# Patient Record
Sex: Female | Born: 1985 | Race: White | Hispanic: No | Smoking: Never smoker
Health system: Southern US, Community
[De-identification: ages and names within clinical notes are randomized; demographics above are authoritative.]

## PROBLEM LIST (undated history)

## (undated) DIAGNOSIS — G2581 Restless legs syndrome: Secondary | ICD-10-CM

## (undated) DIAGNOSIS — M069 Rheumatoid arthritis, unspecified: Secondary | ICD-10-CM

## (undated) DIAGNOSIS — K219 Gastro-esophageal reflux disease without esophagitis: Secondary | ICD-10-CM

## (undated) DIAGNOSIS — Q2112 Patent foramen ovale: Secondary | ICD-10-CM

## (undated) DIAGNOSIS — M5430 Sciatica, unspecified side: Secondary | ICD-10-CM

## (undated) DIAGNOSIS — Q211 Atrial septal defect: Secondary | ICD-10-CM

## (undated) DIAGNOSIS — G959 Disease of spinal cord, unspecified: Secondary | ICD-10-CM

## (undated) DIAGNOSIS — G43909 Migraine, unspecified, not intractable, without status migrainosus: Secondary | ICD-10-CM

## (undated) DIAGNOSIS — F419 Anxiety disorder, unspecified: Secondary | ICD-10-CM

## (undated) DIAGNOSIS — M549 Dorsalgia, unspecified: Secondary | ICD-10-CM

## (undated) DIAGNOSIS — M419 Scoliosis, unspecified: Secondary | ICD-10-CM

## (undated) DIAGNOSIS — T7840XA Allergy, unspecified, initial encounter: Secondary | ICD-10-CM

## (undated) DIAGNOSIS — I341 Nonrheumatic mitral (valve) prolapse: Secondary | ICD-10-CM

## (undated) HISTORY — PX: CARDIAC CATHETERIZATION: SHX172

## (undated) HISTORY — DX: Allergy, unspecified, initial encounter: T78.40XA

## (undated) HISTORY — DX: Gastro-esophageal reflux disease without esophagitis: K21.9

## (undated) HISTORY — DX: Anxiety disorder, unspecified: F41.9

## (undated) HISTORY — DX: Migraine, unspecified, not intractable, without status migrainosus: G43.909

## (undated) HISTORY — PX: BACK SURGERY: SHX140

---

## 2003-12-02 ENCOUNTER — Emergency Department (HOSPITAL_COMMUNITY): Admission: EM | Admit: 2003-12-02 | Discharge: 2003-12-02 | Payer: Self-pay | Admitting: Emergency Medicine

## 2004-01-11 ENCOUNTER — Emergency Department (HOSPITAL_COMMUNITY): Admission: EM | Admit: 2004-01-11 | Discharge: 2004-01-12 | Payer: Self-pay | Admitting: Emergency Medicine

## 2004-11-12 ENCOUNTER — Ambulatory Visit: Payer: Self-pay | Admitting: Pulmonary Disease

## 2004-11-20 ENCOUNTER — Ambulatory Visit: Payer: Self-pay | Admitting: Pulmonary Disease

## 2004-11-20 ENCOUNTER — Ambulatory Visit: Admission: RE | Admit: 2004-11-20 | Discharge: 2004-11-20 | Payer: Self-pay | Admitting: Pulmonary Disease

## 2004-12-01 ENCOUNTER — Ambulatory Visit: Payer: Self-pay | Admitting: Pulmonary Disease

## 2004-12-25 ENCOUNTER — Ambulatory Visit: Payer: Self-pay | Admitting: Pulmonary Disease

## 2005-01-26 ENCOUNTER — Ambulatory Visit: Payer: Self-pay | Admitting: Pulmonary Disease

## 2005-03-13 ENCOUNTER — Emergency Department (HOSPITAL_COMMUNITY): Admission: EM | Admit: 2005-03-13 | Discharge: 2005-03-13 | Payer: Self-pay | Admitting: Emergency Medicine

## 2005-04-16 ENCOUNTER — Ambulatory Visit: Payer: Self-pay | Admitting: Pulmonary Disease

## 2005-05-05 ENCOUNTER — Emergency Department (HOSPITAL_COMMUNITY): Admission: EM | Admit: 2005-05-05 | Discharge: 2005-05-05 | Payer: Self-pay | Admitting: Emergency Medicine

## 2005-06-01 ENCOUNTER — Ambulatory Visit: Payer: Self-pay | Admitting: Pulmonary Disease

## 2006-02-24 ENCOUNTER — Emergency Department (HOSPITAL_COMMUNITY): Admission: EM | Admit: 2006-02-24 | Discharge: 2006-02-24 | Payer: Self-pay | Admitting: Emergency Medicine

## 2010-04-18 ENCOUNTER — Ambulatory Visit (HOSPITAL_COMMUNITY)
Admission: RE | Admit: 2010-04-18 | Discharge: 2010-04-18 | Payer: Self-pay | Admitting: Physical Medicine & Rehabilitation

## 2010-05-28 ENCOUNTER — Emergency Department (HOSPITAL_COMMUNITY): Admission: EM | Admit: 2010-05-28 | Discharge: 2010-05-28 | Payer: Self-pay | Admitting: Emergency Medicine

## 2011-03-20 NOTE — Op Note (Signed)
[name], [name]                ACCOUNT NO.:  [id number]   MEDICAL RECORD NO.:  [id number]          PATIENT TYPE:  OUT   LOCATION:  CARD                         FACILITY:  [facility]   PHYSICIAN:  Oley Balm. Sung Amabile, M.D. [facility] OF BIRTH:  [birth year]   DATE OF PROCEDURE:  11/20/2004  DATE OF DISCHARGE:  11/20/2004                                 OPERATIVE REPORT   PROCEDURE:  Cardiopulmonary stress test.   INDICATIONS FOR PROCEDURE:  Unexplained dyspnea.   DESCRIPTION OF PROCEDURE:  Cardiopulmonary stress testing was performed on a  graded treadmill. Testing was stopped due to dyspnea and unsteadiness.  Effort was maximal. At peak exercise, oxygen uptake was 1.35 liters per  minute or 38% of predicted maximum indicating severe exercise impairment.   At peak exercise, heart rate was 146 or 73% of predicted maximum indicating  that cardiovascular reserve remained. Oxygen pulse was normal suggesting  normal left ventricular function.  Blood pressure response was normal. EKG  tracings revealed no definite ischemia nor arrhythmias. However, there was  abundant artifact on EKG tracings during exercise. Heart rate recovery was  normal.   At peak exercise, minute ventilation was 50.1 liters per minute or 47% of  maximum voluntary ventilation indicating that ventilatory reserve removed.  Gas exchange parameters revealed no abnormalities. Baseline pulmonary  function test revealed mild restriction. There was no obstruction and  __________ capacity was normal. Post exercise spirometry revealed no  exercise induced bronchospasm.   SUMMARY:  Severe exercise impairment due to musculoskeletal limitation and  unsteadiness.  There was a normal cardiopulmonary response to exercise. Mild  restriction on pulmonary function testing.      DBS/MEDQ  D:  12/21/2004  T:  12/22/2004  Job:  [id number]

## 2011-05-10 ENCOUNTER — Emergency Department (HOSPITAL_COMMUNITY)
Admission: EM | Admit: 2011-05-10 | Discharge: 2011-05-10 | Disposition: A | Discharge status: Home / Self Care | Payer: Medicare Other | Attending: Emergency Medicine | Admitting: Emergency Medicine

## 2011-05-10 DIAGNOSIS — W261XXA Contact with sword or dagger, initial encounter: Secondary | ICD-10-CM | POA: Insufficient documentation

## 2011-05-10 DIAGNOSIS — S61209A Unspecified open wound of unspecified finger without damage to nail, initial encounter: Secondary | ICD-10-CM | POA: Insufficient documentation

## 2011-05-10 DIAGNOSIS — Y998 Other external cause status: Secondary | ICD-10-CM | POA: Insufficient documentation

## 2011-05-10 DIAGNOSIS — Z79899 Other long term (current) drug therapy: Secondary | ICD-10-CM | POA: Insufficient documentation

## 2011-05-10 DIAGNOSIS — M4120 Other idiopathic scoliosis, site unspecified: Secondary | ICD-10-CM | POA: Insufficient documentation

## 2011-05-10 DIAGNOSIS — Y92009 Unspecified place in unspecified non-institutional (private) residence as the place of occurrence of the external cause: Secondary | ICD-10-CM | POA: Insufficient documentation

## 2011-05-10 DIAGNOSIS — W260XXA Contact with knife, initial encounter: Secondary | ICD-10-CM | POA: Insufficient documentation

## 2011-11-20 ENCOUNTER — Emergency Department (HOSPITAL_COMMUNITY)
Admission: EM | Admit: 2011-11-20 | Discharge: 2011-11-20 | Disposition: A | Discharge status: Home / Self Care | Payer: Medicare Other | Attending: Emergency Medicine | Admitting: Emergency Medicine

## 2011-11-20 ENCOUNTER — Encounter (HOSPITAL_COMMUNITY): Payer: Self-pay | Admitting: *Deleted

## 2011-11-20 DIAGNOSIS — K089 Disorder of teeth and supporting structures, unspecified: Secondary | ICD-10-CM | POA: Insufficient documentation

## 2011-11-20 DIAGNOSIS — K0889 Other specified disorders of teeth and supporting structures: Secondary | ICD-10-CM

## 2011-11-20 DIAGNOSIS — R51 Headache: Secondary | ICD-10-CM | POA: Insufficient documentation

## 2011-11-20 HISTORY — DX: Disease of spinal cord, unspecified: G95.9

## 2011-11-20 HISTORY — DX: Scoliosis, unspecified: M41.9

## 2011-11-20 HISTORY — DX: Dorsalgia, unspecified: M54.9

## 2011-11-20 HISTORY — DX: Patent foramen ovale: Q21.12

## 2011-11-20 HISTORY — DX: Sciatica, unspecified side: M54.30

## 2011-11-20 HISTORY — DX: Nonrheumatic mitral (valve) prolapse: I34.1

## 2011-11-20 HISTORY — DX: Restless legs syndrome: G25.81

## 2011-11-20 HISTORY — DX: Atrial septal defect: Q21.1

## 2011-11-20 HISTORY — DX: Rheumatoid arthritis, unspecified: M06.9

## 2011-11-20 MED ORDER — LIDOCAINE-EPINEPHRINE 2 %-1:100000 IJ SOLN
20.0000 mL | INTRAMUSCULAR | Status: AC
  Administered 2011-11-20: 20 mL
  Filled 2011-11-20: qty 20

## 2011-11-20 MED ORDER — PENICILLIN V POTASSIUM 500 MG PO TABS: 500.0000 mg | ORAL_TABLET | Freq: Four times a day (QID) | ORAL | Status: AC

## 2011-11-20 MED ORDER — OXYCODONE-ACETAMINOPHEN 5-325 MG PO TABS: 1.0000 | ORAL_TABLET | ORAL | Status: AC | PRN

## 2011-11-20 NOTE — ED Notes (Signed)
Patient reports she woke with pain in the lower right side of her mouth on Wed.  She states she has knots under her jaw.  Patient states she has an appointment on Monday but she could not wait

## 2011-11-20 NOTE — ED Provider Notes (Signed)
History    26 year old female with facial pain. Gradual onset Wednesday. Progressively worsening since then. No pressure will exacerbate or relieving factors. No difficulty breathing or swallowing. Denies trauma. Fever or chills. Has not been to see dentist yet. Says she has an appointment for next week but because of increasing pain did not feel she could wait until the   CSN: [id number]  Arrival date & time 11/20/11  1228   First MD Initiated Contact with Patient 11/20/11 1236      No chief complaint on file.   (Consider location/radiation/quality/duration/timing/severity/associated sxs/prior treatment) HPI  No past medical history on file.  No past surgical history on file.  No family history on file.  History  Substance Use Topics  . Smoking status: Not on file  . Smokeless tobacco: Not on file  . Alcohol Use: Not on file    OB History    No data available      Review of Systems   Review of symptoms negative unless otherwise noted in HPI.   Allergies  Food  Home Medications  No current outpatient prescriptions on file.  There were no vitals taken for this visit.  Physical Exam  Nursing note and vitals reviewed. Constitutional: She appears well-developed and well-nourished. No distress.  HENT:  Head: Normocephalic and atraumatic.       Patient points to the area of the lower right second molar as area of pain. This area is unremarkable in appearance. There is no significant gingivitis. There is no evidence of abscess. No oral lesions noted. Uvula is midline. Handling secretions. The neck is supple without adenopathy. Submental tissues are soft and there is no tongue elevation.  Eyes: Conjunctivae are normal. Right eye exhibits no discharge. Left eye exhibits no discharge.  Neck: Normal range of motion. Neck supple.  Cardiovascular: Normal rate, regular rhythm and normal heart sounds.  Exam reveals no gallop and no friction rub.   No murmur  heard. Pulmonary/Chest: Effort normal and breath sounds normal. No respiratory distress.  Musculoskeletal: She exhibits no edema and no tenderness.  Lymphadenopathy:    She has no cervical adenopathy.  Neurological: She is alert.  Skin: Skin is warm and dry.  Psychiatric: She has a normal mood and affect. Her behavior is normal. Thought content normal.    ED Course  NERVE BLOCK Date/Time: 11/20/2011 3:21 PM Performed by: Raeford Razor Authorized by: Raeford Razor Consent: Verbal consent obtained. Risks and benefits: risks, benefits and alternatives were discussed Consent given by: patient Patient identity confirmed: verbally with patient and arm band Indications: pain relief Body area: face/mouth Nerve: inferior alveolar Laterality: right Patient sedated: no Patient position: sitting Needle gauge: 27 G Location technique: anatomical landmarks Local anesthetic: bupivacaine 0.5% with epinephrine Anesthetic total: 2 ml Outcome: pain improved Patient tolerance: Patient tolerated the procedure well with no immediate complications.   (including critical care time)  Labs Reviewed - No data to display No results found.   1. Pain, dental       MDM  26 year old female with dental pain. No evidence of abscess. No evidence of deep space infection. Patient agreeable to inferior alveolar block. Pain medication provided on an as needed basis. Resource list provided. Dental referral. Return precautions discussed.       Raeford Razor, MD 11/24/11 1525

## 2011-11-20 NOTE — ED Notes (Signed)
Woke up wed am w/ pain in rt lower jaw tooth pain called to speak to dentist but could not get in . This am has knots under jaw now

## 2011-12-23 ENCOUNTER — Other Ambulatory Visit (HOSPITAL_COMMUNITY): Payer: Self-pay | Admitting: Orthopedic Surgery

## 2011-12-23 DIAGNOSIS — M545 Low back pain, unspecified: Secondary | ICD-10-CM

## 2011-12-25 ENCOUNTER — Ambulatory Visit (HOSPITAL_COMMUNITY)
Admission: RE | Admit: 2011-12-25 | Discharge: 2011-12-25 | Disposition: A | Discharge status: Home / Self Care | Payer: Medicare Other | Source: Ambulatory Visit | Attending: Orthopedic Surgery | Admitting: Orthopedic Surgery

## 2011-12-25 DIAGNOSIS — M79609 Pain in unspecified limb: Secondary | ICD-10-CM | POA: Insufficient documentation

## 2011-12-25 DIAGNOSIS — Z981 Arthrodesis status: Secondary | ICD-10-CM | POA: Insufficient documentation

## 2011-12-25 DIAGNOSIS — M545 Low back pain, unspecified: Secondary | ICD-10-CM | POA: Insufficient documentation

## 2011-12-25 DIAGNOSIS — Q675 Congenital deformity of spine: Secondary | ICD-10-CM | POA: Insufficient documentation

## 2012-04-26 ENCOUNTER — Encounter (HOSPITAL_COMMUNITY): Payer: Self-pay | Admitting: *Deleted

## 2012-04-26 ENCOUNTER — Emergency Department (HOSPITAL_COMMUNITY)
Admission: EM | Admit: 2012-04-26 | Discharge: 2012-04-27 | Disposition: A | Discharge status: Home / Self Care | Payer: Medicare Other | Attending: Emergency Medicine | Admitting: Emergency Medicine

## 2012-04-26 ENCOUNTER — Emergency Department (HOSPITAL_COMMUNITY): Payer: Medicare Other

## 2012-04-26 DIAGNOSIS — M549 Dorsalgia, unspecified: Secondary | ICD-10-CM | POA: Insufficient documentation

## 2012-04-26 DIAGNOSIS — M539 Dorsopathy, unspecified: Secondary | ICD-10-CM | POA: Insufficient documentation

## 2012-04-26 DIAGNOSIS — Z9889 Other specified postprocedural states: Secondary | ICD-10-CM | POA: Insufficient documentation

## 2012-04-26 DIAGNOSIS — Z79899 Other long term (current) drug therapy: Secondary | ICD-10-CM | POA: Insufficient documentation

## 2012-04-26 DIAGNOSIS — M069 Rheumatoid arthritis, unspecified: Secondary | ICD-10-CM | POA: Insufficient documentation

## 2012-04-26 DIAGNOSIS — M4120 Other idiopathic scoliosis, site unspecified: Secondary | ICD-10-CM | POA: Insufficient documentation

## 2012-04-26 NOTE — ED Provider Notes (Signed)
History   This chart was scribed for Ashlee Shi, MD by Ashlee [name]. The patient was seen in room TR10C/TR10C and the patient's care was started at 7:09 PM     CSN: [id number]  Arrival date & time 04/26/12  1647   None     Chief Complaint  Patient presents with  . Back Pain    (Consider location/radiation/quality/duration/timing/severity/associated sxs/prior treatment) Patient is a 26 y.o. female presenting with back pain. The history is provided by the patient. No language interpreter was used.  Back Pain  This is a new problem. The current episode started 3 to 5 hours ago. The problem occurs constantly. The problem has not changed since onset.The pain is associated with no known injury. The pain does not radiate. The pain is moderate. The symptoms are aggravated by certain positions. The pain is the same all the time. She has tried nothing for the symptoms. The treatment provided no relief.    Ashlee [name] is a 26 y.o. female who presents to the Emergency Department complaining of moderate, episodic back pain onset today. The pt sat up this morning from her desk, picked up her right leg and her back popped. The pt was referred to [facility] to have x-rays performed. Modifying factors include 4 mg dilaudid (taken at 3:00PM) which provides moderate relief of the back pain.   Pt has a hx of back surgery (Dr. Cherene [name], 03/16/12), spinal cord disease, back pain, scoliosis.        Past Medical History  Diagnosis Date  . Scoliosis   . Rheumatoid arthritis   . Mitral valve prolapse   . Patent foramen ovale   . Back pain   . Restless leg syndrome   . Spinal cord disease   . Sciatica     Past Surgical History  Procedure Date  . Back surgery   . Cardiac catheterization       History  Substance Use Topics  . Smoking status: Never Smoker   . Smokeless tobacco: Not on file  . Alcohol Use: No    OB History    Grav Para Term Preterm Abortions TAB SAB Ect Mult Living        Review of Systems  Musculoskeletal: Positive for back pain.  All other systems reviewed and are negative.    Allergies  Food and Tape  Home Medications   Current Outpatient Rx  Name Route Sig Dispense Refill  . AMPHETAMINE-DEXTROAMPHET ER 25 MG PO CP24 Oral Take 25 mg by mouth every morning.    Marland Kitchen AMPHETAMINE-DEXTROAMPHETAMINE 10 MG PO TABS Oral Take 10 mg by mouth daily at 2 PM daily at 2 PM.     . BACLOFEN 10 MG PO TABS Oral Take 30-40 mg by mouth 3 (three) times daily. 4 tablet in the morning 3 tablets in the afternoon 4 tablets at bedtime    . GABAPENTIN 300 MG PO CAPS Oral Take 900 mg by mouth at bedtime.    Marland Kitchen HYDROMORPHONE HCL 4 MG PO TABS Oral Take 4 mg by mouth 3 (three) times daily as needed. For pain    . HYDROXYCHLOROQUINE SULFATE 200 MG PO TABS Oral Take 200 mg by mouth 2 (two) times daily.     Marland Kitchen NAPROXEN 500 MG PO TABS Oral Take 500 mg by mouth 2 (two) times daily.     Marland Kitchen PREGABALIN 100 MG PO CAPS Oral Take 100 mg by mouth 3 (three) times daily.    . SUMATRIPTAN SUCCINATE 25 MG  PO TABS Oral Take 25 mg by mouth daily as needed. For migraine    . TIZANIDINE HCL 4 MG PO TABS Oral Take 4 mg by mouth 3 (three) times daily.    . TRAMADOL HCL 50 MG PO TABS Oral Take 100 mg by mouth 3 (three) times daily.       BP 123/66  Temp 99.2 F (37.3 C) (Oral)  Resp 20  SpO2 98%  LMP 04/26/2012  Physical Exam  Nursing note and vitals reviewed. Constitutional: She is oriented to person, place, and time. She appears well-developed and well-nourished. No distress.  HENT:  Head: Normocephalic and atraumatic.  Eyes: Pupils are equal, round, and reactive to light.  Neck: Normal range of motion.  Cardiovascular: Normal rate and intact distal pulses.   Pulmonary/Chest: No respiratory distress.  Abdominal: Normal appearance. She exhibits no distension.  Musculoskeletal:       Thoracic back: She exhibits decreased range of motion and pain.       Lumbar back: She exhibits  decreased range of motion and pain.       Back:  Neurological: She is alert and oriented to person, place, and time. No cranial nerve deficit.  Skin: Skin is warm and dry. No rash noted.  Psychiatric: She has a normal mood and affect. Her behavior is normal.    ED Course  Procedures (including critical care time)  DIAGNOSTIC STUDIES: Oxygen Saturation is 98% on room air, normal by my interpretation.    COORDINATION OF CARE:     Labs Reviewed - No data to display No results found.   1. Back pain       MDM  I personally performed the services described in this documentation, which was scribed in my presence. The recorded information has been reviewed and considered.    Plan: Review films for possible hardware malfunction.       Ashlee Shi, MD 05/05/12 [phone]

## 2012-04-26 NOTE — ED Notes (Signed)
The pt returned from c-t 

## 2012-04-26 NOTE — ED Notes (Signed)
Family at bedside. 

## 2012-04-26 NOTE — ED Notes (Signed)
The pt is c/o pain inher entire back and with the slightest movement

## 2012-04-26 NOTE — ED Notes (Signed)
Patient transported to X-ray 

## 2012-04-26 NOTE — ED Notes (Signed)
Patient with recent back surgery, patient unusual pops in back this am, patient unsure if she may have displace hardware placed in back, patient with similar experience post surgery in 2011

## 2012-04-26 NOTE — ED Notes (Signed)
Patient transported to CT 

## 2012-04-27 NOTE — ED Notes (Signed)
Ashlee [name] S  8:00 PM patient discussed in sign out with Dr. Radford Pax. Patient with long history of scoliosis and prior back surgeries rod placements. Patient has chronic back pains. Patient underwent recent revision of back surgeries ML rod placements. Patient was moving and felt a pop with continued pain in the back area. Patient has history of prior bowel function and breaking of equipment and metal rods and back. Patient is concerned for similar symptoms today. Patient undergoing x-rays for evaluation.  X-rays do not show any signs for broken equipment or acute injuries or fractures. Patient still expressing concerns for significant pains. Patient states that there was issues the last time she had a problem with her metal rods and that several x-rays have been taken without any findings however when a CT scan was performed they showed the rods were broken. Patient is concerned that the normal x-rays may not show a significant change in her equipment. Will obtain CT scan.  CT scan is unremarkable and does not show any function of metal rods or screws. There no acute fractures or other signs of injury. At this time patient may be discharged home on her current home pain medications of Dilaudid. Patient will followup with her specialist in [place] this Friday.    Angus Seller, [place] 04/27/12 [phone]

## 2012-04-27 NOTE — Discharge Instructions (Signed)
You were seen and evaluated for your complaints of back pains. Your x-rays and CAT scan had not shown any complications with your rods or screws in your back. There does not appear to be any other significant injury to cause your increased pains. At this time your providers feel you may return home and followup with your specialists as planned. Please call them tomorrow to schedule a close followup appointment. Please continue your home pain medications as prescribed.    Back Pain, Adult Low back pain is very common. About 1 in 5 people have back pain.The cause of low back pain is rarely dangerous. The pain often gets better over time.About half of people with a sudden onset of back pain feel better in just 2 weeks. About 8 in 10 people feel better by 6 weeks.  CAUSES Some common causes of back pain include:  Strain of the muscles or ligaments supporting the spine.   Wear and tear (degeneration) of the spinal discs.   Arthritis.   Direct injury to the back.  DIAGNOSIS Most of the time, the direct cause of low back pain is not known.However, back pain can be treated effectively even when the exact cause of the pain is unknown.Answering your caregiver's questions about your overall health and symptoms is one of the most accurate ways to make sure the cause of your pain is not dangerous. If your caregiver needs more information, he or she may order lab work or imaging tests (X-rays or MRIs).However, even if imaging tests show changes in your back, this usually does not require surgery. HOME CARE INSTRUCTIONS For many people, back pain returns.Since low back pain is rarely dangerous, it is often a condition that people can learn to [facility] their own.   Remain active. It is stressful on the back to sit or stand in one place. Do not sit, drive, or stand in one place for more than 30 minutes at a time. Take short walks on level surfaces as soon as pain allows.Try to increase the length of time  you walk each day.   Do not stay in bed.Resting more than 1 or 2 days can delay your recovery.   Do not avoid exercise or work.Your body is made to move.It is not dangerous to be active, even though your back may hurt.Your back will likely heal faster if you return to being active before your pain is gone.   Pay attention to your body when you bend and lift. Many people have less discomfortwhen lifting if they bend their knees, keep the load close to their bodies,and avoid twisting. Often, the most comfortable positions are those that put less stress on your recovering back.   Find a comfortable position to sleep. Use a firm mattress and lie on your side with your knees slightly bent. If you lie on your back, put a pillow under your knees.   Only take over-the-counter or prescription medicines as directed by your caregiver. Over-the-counter medicines to reduce pain and inflammation are often the most helpful.Your caregiver may prescribe muscle relaxant drugs.These medicines help dull your pain so you can more quickly return to your normal activities and healthy exercise.   Put ice on the injured area.   Put ice in a plastic bag.   Place a towel between your skin and the bag.   Leave the ice on for 15 to 20 minutes, 3 to 4 times a day for the first 2 to 3 days. After that, ice and heat  may be alternated to reduce pain and spasms.   Ask your caregiver about trying back exercises and gentle massage. This may be of some benefit.   Avoid feeling anxious or stressed.Stress increases muscle tension and can worsen back pain.It is important to recognize when you are anxious or stressed and learn ways to manage it.Exercise is a great option.  SEEK MEDICAL CARE IF:  You have pain that is not relieved with rest or medicine.   You have pain that does not improve in 1 week.   You have new symptoms.   You are generally not feeling well.  SEEK IMMEDIATE MEDICAL CARE IF:   You have pain  that radiates from your back into your legs.   You develop new bowel or bladder control problems.   You have unusual weakness or numbness in your arms or legs.   You develop nausea or vomiting.   You develop abdominal pain.   You feel faint.  Document Released: 10/19/2005 Document Revised: 10/08/2011 Document Reviewed: 03/09/2011 [facility] Patient Information 2012 [place], [place].

## 2012-05-25 ENCOUNTER — Ambulatory Visit: Payer: Medicare Other | Attending: Orthopedic Surgery

## 2012-05-25 DIAGNOSIS — IMO0001 Reserved for inherently not codable concepts without codable children: Secondary | ICD-10-CM | POA: Insufficient documentation

## 2012-05-25 DIAGNOSIS — M2560 Stiffness of unspecified joint, not elsewhere classified: Secondary | ICD-10-CM | POA: Insufficient documentation

## 2012-05-25 DIAGNOSIS — R5381 Other malaise: Secondary | ICD-10-CM | POA: Insufficient documentation

## 2012-05-25 DIAGNOSIS — M6281 Muscle weakness (generalized): Secondary | ICD-10-CM | POA: Insufficient documentation

## 2012-05-25 DIAGNOSIS — M545 Low back pain, unspecified: Secondary | ICD-10-CM | POA: Insufficient documentation

## 2012-05-30 ENCOUNTER — Ambulatory Visit: Payer: Medicare Other

## 2012-06-01 ENCOUNTER — Ambulatory Visit: Payer: Medicare Other | Admitting: Physical Therapy

## 2012-06-06 ENCOUNTER — Ambulatory Visit: Payer: Medicare Other | Attending: Orthopedic Surgery

## 2012-06-06 DIAGNOSIS — M545 Low back pain, unspecified: Secondary | ICD-10-CM | POA: Insufficient documentation

## 2012-06-06 DIAGNOSIS — M2560 Stiffness of unspecified joint, not elsewhere classified: Secondary | ICD-10-CM | POA: Insufficient documentation

## 2012-06-06 DIAGNOSIS — M6281 Muscle weakness (generalized): Secondary | ICD-10-CM | POA: Insufficient documentation

## 2012-06-06 DIAGNOSIS — R5381 Other malaise: Secondary | ICD-10-CM | POA: Insufficient documentation

## 2012-06-06 DIAGNOSIS — IMO0001 Reserved for inherently not codable concepts without codable children: Secondary | ICD-10-CM | POA: Insufficient documentation

## 2012-06-08 ENCOUNTER — Ambulatory Visit: Payer: Medicare Other | Admitting: Physical Therapy

## 2012-06-13 ENCOUNTER — Ambulatory Visit: Payer: Medicare Other | Admitting: Physical Therapy

## 2012-06-15 ENCOUNTER — Ambulatory Visit: Payer: Medicare Other | Admitting: Physical Therapy

## 2012-06-20 ENCOUNTER — Ambulatory Visit: Payer: Medicare Other | Admitting: Physical Therapy

## 2012-06-22 ENCOUNTER — Ambulatory Visit: Payer: Medicare Other

## 2012-06-27 ENCOUNTER — Encounter: Payer: Medicare Other | Admitting: Physical Therapy

## 2012-06-28 ENCOUNTER — Ambulatory Visit: Payer: Medicare Other

## 2012-06-29 ENCOUNTER — Encounter: Payer: Medicare Other | Admitting: Physical Therapy

## 2012-06-30 ENCOUNTER — Ambulatory Visit: Payer: Medicare Other

## 2012-07-13 ENCOUNTER — Ambulatory Visit: Payer: Medicare Other | Attending: Orthopedic Surgery | Admitting: Physical Therapy

## 2012-07-13 DIAGNOSIS — M6281 Muscle weakness (generalized): Secondary | ICD-10-CM | POA: Insufficient documentation

## 2012-07-13 DIAGNOSIS — R5381 Other malaise: Secondary | ICD-10-CM | POA: Insufficient documentation

## 2012-07-13 DIAGNOSIS — M545 Low back pain, unspecified: Secondary | ICD-10-CM | POA: Insufficient documentation

## 2012-07-13 DIAGNOSIS — IMO0001 Reserved for inherently not codable concepts without codable children: Secondary | ICD-10-CM | POA: Insufficient documentation

## 2012-07-13 DIAGNOSIS — M2560 Stiffness of unspecified joint, not elsewhere classified: Secondary | ICD-10-CM | POA: Insufficient documentation

## 2012-07-19 ENCOUNTER — Ambulatory Visit: Payer: Medicare Other

## 2012-07-20 ENCOUNTER — Encounter: Payer: Medicare Other | Admitting: Physical Therapy

## 2012-07-21 ENCOUNTER — Ambulatory Visit: Payer: Medicare Other

## 2012-09-13 ENCOUNTER — Ambulatory Visit: Payer: Medicare Other | Attending: Orthopedic Surgery | Admitting: Physical Therapy

## 2012-09-13 DIAGNOSIS — R5381 Other malaise: Secondary | ICD-10-CM | POA: Insufficient documentation

## 2012-09-13 DIAGNOSIS — M2560 Stiffness of unspecified joint, not elsewhere classified: Secondary | ICD-10-CM | POA: Insufficient documentation

## 2012-09-13 DIAGNOSIS — M545 Low back pain, unspecified: Secondary | ICD-10-CM | POA: Insufficient documentation

## 2012-09-13 DIAGNOSIS — IMO0001 Reserved for inherently not codable concepts without codable children: Secondary | ICD-10-CM | POA: Insufficient documentation

## 2012-09-13 DIAGNOSIS — M6281 Muscle weakness (generalized): Secondary | ICD-10-CM | POA: Insufficient documentation

## 2012-10-28 ENCOUNTER — Other Ambulatory Visit (HOSPITAL_COMMUNITY): Payer: Self-pay | Admitting: Orthopedic Surgery

## 2012-10-28 DIAGNOSIS — M4120 Other idiopathic scoliosis, site unspecified: Secondary | ICD-10-CM

## 2012-10-28 DIAGNOSIS — T84019A Broken internal joint prosthesis, unspecified site, initial encounter: Secondary | ICD-10-CM

## 2012-10-28 DIAGNOSIS — IMO0002 Reserved for concepts with insufficient information to code with codable children: Secondary | ICD-10-CM

## 2012-11-01 ENCOUNTER — Ambulatory Visit (HOSPITAL_COMMUNITY)
Admission: RE | Admit: 2012-11-01 | Discharge: 2012-11-01 | Disposition: A | Discharge status: Home / Self Care | Payer: Medicare Other | Source: Ambulatory Visit | Attending: Orthopedic Surgery | Admitting: Orthopedic Surgery

## 2012-11-01 DIAGNOSIS — IMO0002 Reserved for concepts with insufficient information to code with codable children: Secondary | ICD-10-CM | POA: Insufficient documentation

## 2012-11-01 DIAGNOSIS — M4120 Other idiopathic scoliosis, site unspecified: Secondary | ICD-10-CM | POA: Insufficient documentation

## 2012-11-01 DIAGNOSIS — T84019A Broken internal joint prosthesis, unspecified site, initial encounter: Secondary | ICD-10-CM

## 2012-11-01 DIAGNOSIS — Z981 Arthrodesis status: Secondary | ICD-10-CM | POA: Insufficient documentation

## 2013-05-31 ENCOUNTER — Ambulatory Visit: Payer: Medicare Other | Attending: Orthopedic Surgery

## 2013-05-31 DIAGNOSIS — R262 Difficulty in walking, not elsewhere classified: Secondary | ICD-10-CM | POA: Insufficient documentation

## 2013-05-31 DIAGNOSIS — M545 Low back pain, unspecified: Secondary | ICD-10-CM | POA: Insufficient documentation

## 2013-05-31 DIAGNOSIS — R269 Unspecified abnormalities of gait and mobility: Secondary | ICD-10-CM | POA: Insufficient documentation

## 2013-05-31 DIAGNOSIS — R5381 Other malaise: Secondary | ICD-10-CM | POA: Insufficient documentation

## 2013-05-31 DIAGNOSIS — IMO0001 Reserved for inherently not codable concepts without codable children: Secondary | ICD-10-CM | POA: Insufficient documentation

## 2013-07-19 ENCOUNTER — Encounter (HOSPITAL_COMMUNITY): Payer: Self-pay | Admitting: *Deleted

## 2013-07-19 ENCOUNTER — Emergency Department (HOSPITAL_COMMUNITY)
Admission: EM | Admit: 2013-07-19 | Discharge: 2013-07-19 | Disposition: A | Discharge status: Home / Self Care | Payer: Medicare Other | Attending: Emergency Medicine | Admitting: Emergency Medicine

## 2013-07-19 ENCOUNTER — Emergency Department (HOSPITAL_COMMUNITY): Payer: Medicare Other

## 2013-07-19 DIAGNOSIS — Z8679 Personal history of other diseases of the circulatory system: Secondary | ICD-10-CM | POA: Insufficient documentation

## 2013-07-19 DIAGNOSIS — G2581 Restless legs syndrome: Secondary | ICD-10-CM | POA: Insufficient documentation

## 2013-07-19 DIAGNOSIS — L02419 Cutaneous abscess of limb, unspecified: Secondary | ICD-10-CM | POA: Insufficient documentation

## 2013-07-19 DIAGNOSIS — Z791 Long term (current) use of non-steroidal anti-inflammatories (NSAID): Secondary | ICD-10-CM | POA: Insufficient documentation

## 2013-07-19 DIAGNOSIS — L0390 Cellulitis, unspecified: Secondary | ICD-10-CM

## 2013-07-19 DIAGNOSIS — M069 Rheumatoid arthritis, unspecified: Secondary | ICD-10-CM | POA: Insufficient documentation

## 2013-07-19 DIAGNOSIS — Z9861 Coronary angioplasty status: Secondary | ICD-10-CM | POA: Insufficient documentation

## 2013-07-19 DIAGNOSIS — Z79899 Other long term (current) drug therapy: Secondary | ICD-10-CM | POA: Insufficient documentation

## 2013-07-19 MED ORDER — CEPHALEXIN 500 MG PO CAPS: 500.0000 mg | ORAL_CAPSULE | Freq: Four times a day (QID) | ORAL | Status: DC

## 2013-07-19 NOTE — ED Notes (Signed)
Pt mother complaining about length of stay. States she needs to get home to her mother. apoligized for wait.

## 2013-07-19 NOTE — ED Notes (Signed)
Pt wears braces due to spinal issues

## 2013-07-19 NOTE — ED Notes (Signed)
To ED for eval of left ankle ulcer from leg braces. Pt states she noticed the ulceration this am. Pt has been wearing bilateral braces for the past approx 15 yrs.

## 2013-07-19 NOTE — ED Provider Notes (Signed)
CSN: [id number]     Arrival date & time 07/19/13  1436 History  This chart was scribed for non-physician practitioner, Junious Silk, PA-C working with Shelda Jakes, MD by Greggory Stallion, ED scribe. This patient was seen in room TR09C/TR09C and the patient's care was started at 4:51 PM.   Chief Complaint  Patient presents with  . Ankle Pain   The history is provided by the patient. No language interpreter was used.    HPI Comments: Ashlee [name] is a 27 y.o. female who presents to the Emergency Department complaining of left ankle pain with associated mild swelling due to an ulceration from her leg braces that started yesterday morning. Pt states the redness and swelling around the area has worsened today. She states it is sensitive to touch. Pt states she normally has radiating pain to her legs from the scoliosis and takes several pain medications so she is unsure how severe her pain is right now. Pt denies fever, nausea, emesis and trouble breathing. She states she has work bilateral braces for the last 15 years for her scoliosis. Pt denies h/o diabetes.   Past Medical History  Diagnosis Date  . Scoliosis   . Rheumatoid arthritis(714.0)   . Mitral valve prolapse   . Patent foramen ovale   . Back pain   . Restless leg syndrome   . Spinal cord disease   . Sciatica    Past Surgical History  Procedure Laterality Date  . Back surgery    . Cardiac catheterization     History reviewed. No pertinent family history. History  Substance Use Topics  . Smoking status: Never Smoker   . Smokeless tobacco: Not on file  . Alcohol Use: No   OB History   Grav Para Term Preterm Abortions TAB SAB Ect Mult Living                 Review of Systems  Constitutional: Negative for fever.  Respiratory: Negative for shortness of breath.   Gastrointestinal: Negative for nausea and vomiting.  Musculoskeletal: Positive for joint swelling and arthralgias.  All other systems reviewed and are  negative.    Allergies  Food and Tape  Home Medications   Current Outpatient Rx  Name  Route  Sig  Dispense  Refill  . acetaminophen (TYLENOL) 325 MG tablet   Oral   Take 650 mg by mouth every 6 (six) hours as needed for pain.         Marland Kitchen amphetamine-dextroamphetamine (ADDERALL XR) 25 MG 24 hr capsule   Oral   Take 25 mg by mouth every morning.         Marland Kitchen amphetamine-dextroamphetamine (ADDERALL) 10 MG tablet   Oral   Take 10 mg by mouth daily at 2 PM daily at 2 PM.          . baclofen (LIORESAL) 10 MG tablet   Oral   Take 30-40 mg by mouth 3 (three) times daily. 4 tablet in the morning 3 tablets in the afternoon 4 tablets at bedtime         . gabapentin (NEURONTIN) 300 MG capsule   Oral   Take 900 mg by mouth at bedtime.         Marland Kitchen HYDROmorphone (DILAUDID) 4 MG tablet   Oral   Take 4 mg by mouth 3 (three) times daily as needed. For pain         . hydroxychloroquine (PLAQUENIL) 200 MG tablet   Oral  Take 200 mg by mouth 2 (two) times daily.          . naproxen (NAPROSYN) 500 MG tablet   Oral   Take 500 mg by mouth 2 (two) times daily.          . pregabalin (LYRICA) 100 MG capsule   Oral   Take 100 mg by mouth 3 (three) times daily.         . SUMAtriptan (IMITREX) 25 MG tablet   Oral   Take 25 mg by mouth daily as needed for migraine. For migraine         . tiZANidine (ZANAFLEX) 4 MG tablet   Oral   Take 4 mg by mouth 3 (three) times daily.         . traMADol (ULTRAM) 50 MG tablet   Oral   Take 100 mg by mouth 3 (three) times daily.           BP 125/75  Pulse 111  Temp(Src) 99.3 F (37.4 C) (Oral)  Resp 18  SpO2 97%  Physical Exam  Nursing note and vitals reviewed. Constitutional: She is oriented to person, place, and time. She appears well-developed and well-nourished. No distress.  HENT:  Head: Normocephalic and atraumatic.  Right Ear: External ear normal.  Left Ear: External ear normal.  Nose: Nose normal.   Mouth/Throat: Oropharynx is clear and moist.  Eyes: Conjunctivae are normal.  Neck: Normal range of motion.  Cardiovascular: Normal rate, regular rhythm and normal heart sounds.   Capillary refill less than 3 seconds in all toes.   Pulmonary/Chest: Effort normal and breath sounds normal. No stridor. No respiratory distress. She has no wheezes. She has no rales.  Abdominal: Soft. She exhibits no distension.  Musculoskeletal: Normal range of motion.  Neurological: She is alert and oriented to person, place, and time. She has normal strength.  Skin: Skin is warm and dry. She is not diaphoretic. There is erythema.  4 cm area of erythema without induration or fluctuance.   Psychiatric: She has a normal mood and affect. Her behavior is normal.    ED Course  Procedures (including critical care time)  DIAGNOSTIC STUDIES: Oxygen Saturation is 97% on RA, normal by my interpretation.    COORDINATION OF CARE: 4:56 PM-Discussed treatment plan which includes xray with pt at bedside and pt agreed to plan.   Labs Review Labs Reviewed - No data to display Imaging Review Dg Ankle Complete Left  07/19/2013   CLINICAL DATA:  Pain with cellulitis and redness  EXAM: LEFT ANKLE COMPLETE - 3+ VIEW  COMPARISON:  None.  FINDINGS: Frontal, oblique, and lateral views were obtained. There is no fracture or effusion. Ankle mortise appears intact. No erosive change or bony destruction. There is no demonstrable radiopaque foreign body or soft tissue abscess.  IMPRESSION: No abnormality noted.   Electronically Signed   By: Bretta Bang   On: 07/19/2013 18:11    MDM   1. Cellulitis    Suspect uncomplicated cellulitis based on limited area of involvement, minimal pain, no systemic signs of illness (eg, fever, chills, dehydration, altered mental status, tachypnea, tachycardia, hypotension), no risk factors for serious illness (eg, extremes of age, immunocompromised status).   PE reveals redness, swelling,  mildly tender, warm to touch. Skin intact, No bleeding. No bullae. Non purulent. Non circumferential.  Borders are not elevated or sharply demarcated. Drew a line around the area of infection. Pt was instructed to return to the ED if area surpasses  the boarder or pain intensifies.     I personally performed the services described in this documentation, which was scribed in my presence. The recorded information has been reviewed and is accurate.    Mora Bellman, PA-C 07/20/13 [phone]

## 2013-07-21 NOTE — ED Provider Notes (Signed)
Medical screening examination/treatment/procedure(s) were performed by non-physician practitioner and as supervising physician I was immediately available for consultation/collaboration.   Shelda Jakes, MD 07/21/13 [phone]

## 2013-08-08 ENCOUNTER — Ambulatory Visit: Payer: Medicare Other | Attending: Orthopedic Surgery | Admitting: Physical Therapy

## 2013-08-08 DIAGNOSIS — R5381 Other malaise: Secondary | ICD-10-CM | POA: Insufficient documentation

## 2013-08-08 DIAGNOSIS — M545 Low back pain, unspecified: Secondary | ICD-10-CM | POA: Insufficient documentation

## 2013-08-08 DIAGNOSIS — IMO0001 Reserved for inherently not codable concepts without codable children: Secondary | ICD-10-CM | POA: Insufficient documentation

## 2013-08-08 DIAGNOSIS — R269 Unspecified abnormalities of gait and mobility: Secondary | ICD-10-CM | POA: Insufficient documentation

## 2013-08-08 DIAGNOSIS — R262 Difficulty in walking, not elsewhere classified: Secondary | ICD-10-CM | POA: Insufficient documentation

## 2013-08-10 ENCOUNTER — Ambulatory Visit: Payer: Medicare Other | Admitting: Physical Therapy

## 2013-09-21 ENCOUNTER — Other Ambulatory Visit (HOSPITAL_COMMUNITY): Payer: Self-pay | Admitting: Orthopedic Surgery

## 2013-09-21 DIAGNOSIS — M4120 Other idiopathic scoliosis, site unspecified: Secondary | ICD-10-CM

## 2013-09-21 DIAGNOSIS — T84019D Broken internal joint prosthesis, unspecified site, subsequent encounter: Secondary | ICD-10-CM

## 2013-09-25 ENCOUNTER — Ambulatory Visit (HOSPITAL_COMMUNITY)
Admission: RE | Admit: 2013-09-25 | Discharge: 2013-09-25 | Disposition: A | Discharge status: Home / Self Care | Payer: Medicare Other | Source: Ambulatory Visit | Attending: Orthopedic Surgery | Admitting: Orthopedic Surgery

## 2013-09-25 ENCOUNTER — Encounter (HOSPITAL_COMMUNITY): Payer: Self-pay

## 2013-09-25 DIAGNOSIS — Z981 Arthrodesis status: Secondary | ICD-10-CM | POA: Insufficient documentation

## 2013-09-25 DIAGNOSIS — M4120 Other idiopathic scoliosis, site unspecified: Secondary | ICD-10-CM | POA: Insufficient documentation

## 2013-09-25 DIAGNOSIS — T84019D Broken internal joint prosthesis, unspecified site, subsequent encounter: Secondary | ICD-10-CM

## 2014-06-18 ENCOUNTER — Encounter (HOSPITAL_COMMUNITY): Payer: Self-pay | Admitting: Emergency Medicine

## 2014-06-18 ENCOUNTER — Emergency Department (HOSPITAL_COMMUNITY)
Admission: EM | Admit: 2014-06-18 | Discharge: 2014-06-18 | Disposition: A | Discharge status: Home / Self Care | Payer: Medicare Other | Attending: Emergency Medicine | Admitting: Emergency Medicine

## 2014-06-18 ENCOUNTER — Emergency Department (HOSPITAL_COMMUNITY): Payer: Medicare Other

## 2014-06-18 DIAGNOSIS — M25529 Pain in unspecified elbow: Secondary | ICD-10-CM | POA: Diagnosis present

## 2014-06-18 DIAGNOSIS — Q211 Atrial septal defect: Secondary | ICD-10-CM | POA: Diagnosis not present

## 2014-06-18 DIAGNOSIS — M7021 Olecranon bursitis, right elbow: Secondary | ICD-10-CM

## 2014-06-18 DIAGNOSIS — M7020 Olecranon bursitis, unspecified elbow: Secondary | ICD-10-CM | POA: Diagnosis not present

## 2014-06-18 DIAGNOSIS — Z79899 Other long term (current) drug therapy: Secondary | ICD-10-CM | POA: Diagnosis not present

## 2014-06-18 DIAGNOSIS — Z8679 Personal history of other diseases of the circulatory system: Secondary | ICD-10-CM | POA: Insufficient documentation

## 2014-06-18 DIAGNOSIS — Z791 Long term (current) use of non-steroidal anti-inflammatories (NSAID): Secondary | ICD-10-CM | POA: Diagnosis not present

## 2014-06-18 DIAGNOSIS — Q2111 Secundum atrial septal defect: Secondary | ICD-10-CM | POA: Diagnosis not present

## 2014-06-18 DIAGNOSIS — Z9889 Other specified postprocedural states: Secondary | ICD-10-CM | POA: Diagnosis not present

## 2014-06-18 DIAGNOSIS — M069 Rheumatoid arthritis, unspecified: Secondary | ICD-10-CM | POA: Insufficient documentation

## 2014-06-18 DIAGNOSIS — G2581 Restless legs syndrome: Secondary | ICD-10-CM | POA: Insufficient documentation

## 2014-06-18 LAB — SYNOVIAL CELL COUNT + DIFF, W/ CRYSTALS
CRYSTALS FLUID: NONE SEEN
Lymphocytes-Synovial Fld: 13 % (ref 0–20)
MONOCYTE-MACROPHAGE-SYNOVIAL FLUID: 9 % — AB (ref 50–90)
Neutrophil, Synovial: 78 % — ABNORMAL HIGH (ref 0–25)
WBC, Synovial: 4502 /mm3 — ABNORMAL HIGH (ref 0–200)

## 2014-06-18 MED ORDER — CEPHALEXIN 500 MG PO CAPS: 500.0000 mg | ORAL_CAPSULE | Freq: Four times a day (QID) | ORAL | Status: DC

## 2014-06-18 NOTE — ED Provider Notes (Signed)
CSN: [id number]     Arrival date & time 06/18/14  1539 History   First MD Initiated Contact with Patient 06/18/14 2004     Chief Complaint  Patient presents with  . Elbow Pain      The history is provided by the patient.  Patient presents for right elbow pain for past week.  She reports increasing redness/swelling to elbow No direct trauma No fever/weakness She reports she uses a walker and has to lean on bent elbow which causes friction to elbow She reports h/o cellulitis to other extremities previously  No cp/sob.  No weakness No fever/chills vomiting reported  Past Medical History  Diagnosis Date  . Scoliosis   . Rheumatoid arthritis(714.0)   . Mitral valve prolapse   . Patent foramen ovale   . Back pain   . Restless leg syndrome   . Spinal cord disease   . Sciatica    Past Surgical History  Procedure Laterality Date  . Back surgery    . Cardiac catheterization     History reviewed. No pertinent family history. History  Substance Use Topics  . Smoking status: Never Smoker   . Smokeless tobacco: Not on file  . Alcohol Use: No   OB History   Grav Para Term Preterm Abortions TAB SAB Ect Mult Living                 Review of Systems  Constitutional: Negative for fever.  Respiratory: Negative for shortness of breath.   Cardiovascular: Negative for chest pain.  Musculoskeletal: Positive for arthralgias.      Allergies  Food and Tape  Home Medications   Prior to Admission medications   Medication Sig Start Date End Date Taking? Authorizing Provider  acetaminophen (TYLENOL) 325 MG tablet Take 650 mg by mouth every 6 (six) hours as needed for pain.   Yes Historical Provider, MD  amphetamine-dextroamphetamine (ADDERALL XR) 25 MG 24 hr capsule Take 25 mg by mouth every morning.   Yes Historical Provider, MD  amphetamine-dextroamphetamine (ADDERALL) 10 MG tablet Take 10 mg by mouth every evening.    Yes Historical Provider, MD  baclofen (LIORESAL) 10 MG tablet  Take 30-40 mg by mouth 3 (three) times daily. 4 tablet in the morning 3 tablets in the afternoon 4 tablets at bedtime   Yes Historical Provider, MD  gabapentin (NEURONTIN) 300 MG capsule Take 900 mg by mouth at bedtime.   Yes Historical Provider, MD  HYDROmorphone (DILAUDID) 4 MG tablet Take 4 mg by mouth 3 (three) times daily as needed. For pain   Yes Historical Provider, MD  hydroxychloroquine (PLAQUENIL) 200 MG tablet Take 200 mg by mouth 2 (two) times daily.    Yes Historical Provider, MD  naproxen (NAPROSYN) 500 MG tablet Take 500 mg by mouth 2 (two) times daily.    Yes Historical Provider, MD  pregabalin (LYRICA) 100 MG capsule Take 100 mg by mouth 3 (three) times daily.   Yes Historical Provider, MD  SUMAtriptan (IMITREX) 25 MG tablet Take 25 mg by mouth daily as needed for migraine. For migraine   Yes Historical Provider, MD  tiZANidine (ZANAFLEX) 4 MG tablet Take 4 mg by mouth 3 (three) times daily.   Yes Historical Provider, MD  traMADol (ULTRAM) 50 MG tablet Take 50-100 mg by mouth 3 (three) times daily as needed for moderate pain.    Yes Historical Provider, MD  cephALEXin (KEFLEX) 500 MG capsule Take 1 capsule (500 mg total) by mouth 4 (four) times  daily. 06/18/14   Joya Gaskins, MD   BP 123/70  Pulse 99  Temp(Src) 98.7 F (37.1 C) (Oral)  Resp 18  SpO2 97%  LMP 05/29/2014 Physical Exam CONSTITUTIONAL: Well developed/well nourished HEAD: Normocephalic/atraumatic EYES: EOMI/PERRL ENMT: Mucous membranes moist NECK: supple no meningeal signs CV: S1/S2 noted, no murmurs/rubs/gallops noted LUNGS: Lungs are clear to auscultation bilaterally, no apparent distress ABDOMEN: soft, nontender, no rebound or guarding NEURO: Pt is awake/alert, moves all extremitiesx4 EXTREMITIES: pulses normal, full ROM Right olecranon - erythema noted, but no streaking/crepitus noted.  She has full ROM of elbow without difficulty.  Bursitis noted SKIN: warm, color normal PSYCH: no abnormalities  of mood noted  ED Course  ARTHOCENTESIS Date/Time: 06/18/2014 9:13 PM Performed by: Joya Gaskins Authorized by: Joya Gaskins Consent: Verbal consent obtained. written consent obtained. Risks and benefits: risks, benefits and alternatives were discussed Patient identity confirmed: verbally with patient and arm band Time out: Immediately prior to procedure a "time out" was called to verify the correct patient, procedure, equipment, support staff and site/side marked as required. Indications: joint swelling,  pain and possible septic joint  Body area: elbow Local anesthesia used: yes Anesthesia: local infiltration Local anesthetic: lidocaine 2% without epinephrine Patient sedated: no Approach: posterior Aspirate: serous Patient tolerance: Patient tolerated the procedure well with no immediate complications. Comments: Right elbow aspirated without difficulty Area of erythema was avoided for aspiration    Call back number is [phone]  11:26 PM Initial studies do not support septic joint Pt will f/u as outpatient Culture pending Labs Review Labs Reviewed  SYNOVIAL CELL COUNT + DIFF, W/ CRYSTALS - Abnormal; Notable for the following:    Color, Synovial RED (*)    Appearance-Synovial CLOUDY (*)    WBC, Synovial 4502 (*)    Neutrophil, Synovial 78 (*)    Monocyte-Macrophage-Synovial Fluid 9 (*)    All other components within normal limits  BODY FLUID CULTURE    Imaging Review Dg Elbow Complete Right  06/18/2014   CLINICAL DATA:  Pain and redness  EXAM: RIGHT ELBOW - COMPLETE 3+ VIEW  COMPARISON:  None.  FINDINGS: Four views of the right elbow submitted. No acute fracture or subluxation. No periosteal reaction or bony erosion. No posterior fat pad sign. Significant soft tissue swelling olecranon region. Bursitis or cellulitis cannot be excluded.  IMPRESSION: No acute fracture or subluxation. Soft tissue swelling olecranon region suspicious for bursitis or cellulitis.    Electronically Signed   By: Natasha Mead M.D.   On: 06/18/2014 16:55      MDM   Final diagnoses:  Olecranon bursitis, right    Nursing notes including past medical history and social history reviewed and considered in documentation xrays reviewed and considered     Joya Gaskins, MD 06/18/14 2326

## 2014-06-18 NOTE — ED Notes (Signed)
Dr. Wickline at bedside.  

## 2014-06-18 NOTE — Discharge Instructions (Signed)
Bursitis °Bursitis is a swelling and soreness (inflammation) of a fluid-filled sac (bursa) that overlies and protects a joint. It can be caused by injury, overuse of the joint, arthritis or infection. The joints most likely to be affected are the elbows, shoulders, hips and knees. °HOME CARE INSTRUCTIONS  °· Apply ice to the affected area for 15-20 minutes each hour while awake for 2 days. Put the ice in a plastic bag and place a towel between the bag of ice and your skin. °· Rest the injured joint as much as possible, but continue to put the joint through a full range of motion, 4 times per day. (The shoulder joint especially becomes rapidly "frozen" if not used.) When the pain lessens, begin normal slow movements and usual activities. °· Only take over-the-counter or prescription medicines for pain, discomfort or fever as directed by your caregiver. °· Your caregiver may recommend draining the bursa and injecting medicine into the bursa. This may help the healing process. °· Follow all instructions for follow-up with your caregiver. This includes any orthopedic referrals, physical therapy and rehabilitation. Any delay in obtaining necessary care could result in a delay or failure of the bursitis to heal and chronic pain. °SEEK IMMEDIATE MEDICAL CARE IF:  °· Your pain increases even during treatment. °· You develop an oral temperature above 102° F (38.9° C) and have heat and inflammation over the involved bursa. °MAKE SURE YOU:  °· Understand these instructions. °· Will watch your condition. °· Will get help right away if you are not doing well or get worse. °Document Released: 10/16/2000 Document Revised: 01/11/2012 Document Reviewed: 01/08/2014 °[employer]® Patient Information ©2015 [employer]. This information is not intended to replace advice given to you by your health care provider. Make sure you discuss any questions you have with your health care provider. ° °

## 2014-06-18 NOTE — ED Notes (Signed)
Pt in c/o redness and swelling to right elbow for the last week, history of cellulitis to same elbow, denies drainage from area

## 2014-06-18 NOTE — ED Notes (Signed)
Discharge instructions reviewed with pt. Pt verbalized understanding.   

## 2014-06-22 LAB — BODY FLUID CULTURE: CULTURE: NO GROWTH

## 2015-02-05 ENCOUNTER — Ambulatory Visit: Payer: Medicare Other | Attending: Orthopedic Surgery | Admitting: Physical Therapy

## 2015-02-05 DIAGNOSIS — R29898 Other symptoms and signs involving the musculoskeletal system: Secondary | ICD-10-CM | POA: Insufficient documentation

## 2015-02-05 DIAGNOSIS — R262 Difficulty in walking, not elsewhere classified: Secondary | ICD-10-CM | POA: Diagnosis not present

## 2015-02-05 DIAGNOSIS — M4120 Other idiopathic scoliosis, site unspecified: Secondary | ICD-10-CM

## 2015-02-05 DIAGNOSIS — M546 Pain in thoracic spine: Secondary | ICD-10-CM | POA: Insufficient documentation

## 2015-02-05 DIAGNOSIS — M6289 Other specified disorders of muscle: Secondary | ICD-10-CM | POA: Diagnosis not present

## 2015-02-05 DIAGNOSIS — Z4782 Encounter for orthopedic aftercare following scoliosis surgery: Secondary | ICD-10-CM

## 2015-02-05 DIAGNOSIS — G8929 Other chronic pain: Secondary | ICD-10-CM | POA: Diagnosis not present

## 2015-02-05 DIAGNOSIS — M25561 Pain in right knee: Secondary | ICD-10-CM | POA: Diagnosis not present

## 2015-02-05 NOTE — Therapy (Signed)
[facility] [address] [place], [place], [address] Phone: [phone]   Fax:  [phone]  Physical Therapy Evaluation  Patient Details  Name: Ashlee [name] MRN: [id number] Date of Birth: 11-08-1985 Referring Provider:  Dorita Fray, MD  Encounter Date: 02/05/2015      PT End of Session - 02/05/15 1212    Visit Number 1   Number of Visits 16   Date for PT Re-Evaluation 04/02/15   PT Start Time 0930   PT Stop Time 1016   PT Time Calculation (min) 46 min   Activity Tolerance Patient tolerated treatment well;No increased pain      Past Medical History  Diagnosis Date  . Scoliosis   . Rheumatoid arthritis(714.0)   . Mitral valve prolapse   . Patent foramen ovale   . Back pain   . Restless leg syndrome   . Spinal cord disease   . Sciatica     Past Surgical History  Procedure Laterality Date  . Back surgery    . Cardiac catheterization      There were no vitals filed for this visit.  Visit Diagnosis:  Left-sided thoracic back pain  Encounter for orthopedic aftercare following scoliosis surgery  Idiopathic scoliosis      Subjective Assessment - 02/05/15 0941    Subjective Pt with onset of L sided thoracic pain which began 12/25/14.  She has occ pinching on L side, abnormal gait, tingling in bilat. LEs. , abnormal sensation, Rt >Lt. LE weakness.     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  Total # surgeries is 4.  angina, heart murmur, IBS, RA, anemia, RLS.    Limitations Standing;House hold activities;Walking   How long can you sit comfortably? does not aggravate   How long can you stand comfortably? does not like to stand still incr LE fatigue   How long can you walk comfortably? variable, depends on pain   Diagnostic tests many XR    Patient Stated Goals wants to strengthen core   Currently in Pain? Yes   Pain Score 4    Pain Location  Back   Pain Orientation Mid;Lower   Pain Descriptors / Indicators Sore   Pain Type Chronic pain   Pain Radiating Towards LEs   burning    Pain Onset More than a month ago   Pain Frequency Intermittent   Aggravating Factors  standing   Pain Relieving Factors pain meds, heat, ice   Multiple Pain Sites Yes   Pain Score 2   Pain Location Back   Pain Orientation Mid   Pain Descriptors / Indicators Sore   Pain Type Acute pain   Pain Radiating Towards occasionally wraps to L ribs   Pain Onset More than a month ago   Pain Frequency Intermittent   Aggravating Factors  coughing, breathing deep, turning suddenly, sometimes reaching with arm   Pain Relieving Factors rest, sitting, cold            [facility] PT Assessment - 02/05/15 1059    Assessment   Onset Date 12/25/14  chronic pain in LS spine from surgeries   Next MD Visit end of April   Prior Therapy Yes   Precautions   Precautions Back   Precaution Booklet Issued No   Precaution Comments report no bending, lifting, twisting 1 yr post PT   Required Braces or Orthoses Other Brace/Splint  Other Brace/Splint wears bilat. AFO   Restrictions   Weight Bearing Restrictions No   Balance Screen   Has the patient fallen in the past 6 months No   Has the patient had a decrease in activity level because of a fear of falling?  Yes   Is the patient reluctant to leave their home because of a fear of falling?  No   Home Environment   Living Enviornment Private residence   Living Arrangements Alone   Available Help at Discharge Family   Additional Comments mother helps as needed   Prior Function   Level of Independence Independent with basic ADLs;Independent with homemaking with ambulation   Warden/ranger;Unemployed   Cognition   Overall Cognitive Status Within Functional Limits for tasks assessed   Observation/Other Assessments   Focus on Therapeutic Outcomes (FOTO)  Lumbar 67% limited    Other Surveys  --  Did neck FOTO 34%    Sensation   Light Touch Impaired by gross assessment   Hot/Cold Impaired by gross assessment   Proprioception Impaired by gross assessment   Additional Comments LE impaired   Posture/Postural Control   Posture/Postural Control Postural limitations   Postural Limitations Decreased lumbar lordosis;Decreased thoracic kyphosis;Left pelvic obliquity   Posture Comments Trunk shifted to L., high Rt shoulder, significant scoliosis with well healed incisions, scars   ROM / Strength   AROM / PROM / Strength AROM;Strength   AROM   Overall AROM  Due to precautions;Other (comment)   Overall AROM Comments LE ROM WNL   Strength   Right/Left Shoulder Left  WFL   Right Hip Flexion 2+/5   Right Hip ABduction 2/5   Left Hip Flexion 2+/5   Left Hip ABduction 2+/5   Right Knee Flexion 3+/5   Right Knee Extension 3-/5   Left Knee Flexion 4-/5   Left Knee Extension 3+/5   Right Ankle Dorsiflexion 2+/5   Right Ankle Plantar Flexion 3-/5   Left Ankle Dorsiflexion 3+/5   Left Ankle Plantar Flexion 3-/5   Flexibility   Hamstrings Rt. tighter than L.    Palpation   Palpation min tenderness with palpation to mid thoracic paraspinals and into L lumbar, glutes   Ambulation/Gait   Ambulation/Gait Yes   Ambulation/Gait Assistance 6: Modified independent (Device/Increase time)   Ambulation Distance (Feet) 300 Feet   Assistive device Standard walker  uses platform walker at home, has used 1-2 canes previous   Gait Pattern Decreased stride length;Right foot flat;Left foot flat;Right flexed knee in stance;Left flexed knee in stance;Ataxic;Trunk flexed;Poor foot clearance - right   Ambulation Surface Level   Gait Comments walks with knees flexed , reports Rt. knee hyperextends   Balance   Balance Assessed Yes   Static Standing Balance   Static Standing - Balance Support No upper extremity supported   Static Standing - Level of Assistance 5: Stand by assistance           PT Education - 02/05/15 1210     Education provided Yes   Education Details PT/POC, HEP and transverse abd.   Person(s) Educated Patient   Methods Explanation;Demonstration;Handout   Comprehension Need further instruction;Verbalized understanding;Returned demonstration;Verbal cues required          PT Short Term Goals - 02/05/15 1224    PT SHORT TERM GOAL #1   Title Pt will be able to report less pain with transitions (bed mobility), coughing, deep breathing (25% improved)   Time 4   Period Weeks   Status  New   PT SHORT TERM GOAL #2   Title Pt will be I with HEP for spinal stabilization   Time 4   Period Weeks   Status New   PT SHORT TERM GOAL #3   Title Pt will be able to tolerate standing for up to 10 min most days of the week, pain <3/10 in low, middle back   Time 4   Period Weeks   Status New           PT Long Term Goals - 2015/02/28 1227    PT LONG TERM GOAL #1   Title Pt will be I with advanced HEP   Time 8   Period Weeks   Status New   PT LONG TERM GOAL #2   Title Pt will be able to walk as needed in the community with LRAD with pain > 3/10 most of the time.    Time 8   Period Weeks   Status New   PT LONG TERM GOAL #3   Title Pt will report min decrease in LE pain with household activities.    Time 8   Period Weeks   Status New   PT LONG TERM GOAL #4   Title Pt will increase hip abd strength to 3+/5 bilateral to ease walking.    Time 8   Period Weeks   Status New   PT LONG TERM GOAL #5   Title Pt will score FOTO for L spine < 48% limited to demo functional improvement                Plan - 02-28-2015 1218    Clinical Impression Statement This patient presents with new onset of thoracic pain since Feb. 2016.  She will benefit from spinal stabilization techniques, gait and HEP to allow her to tolerate standing activities and take undue stress off of spinal hardware.    Pt will benefit from skilled therapeutic intervention in order to improve on the following deficits Abnormal  gait;Decreased activity tolerance;Decreased balance;Decreased mobility;Decreased strength;Pain;Impaired UE functional use;Decreased knowledge of use of DME;Increased fascial restricitons;Impaired tone;Postural dysfunction;Impaired sensation;Difficulty walking;Impaired flexibility;Decreased range of motion   Rehab Potential Good   PT Frequency 2x / week   PT Duration 8 weeks   PT Treatment/Interventions ADLs/Self Care Home Management;Moist Heat;Therapeutic activities;Patient/family education;Passive range of motion;Therapeutic exercise;DME Instruction;Ultrasound;Gait training;Balance training;Manual techniques;Neuromuscular re-education;Cryotherapy;Functional mobility training   PT Next Visit Plan begin spinal stabilization (start with lumbar) and soft tissue to L thoracic   PT Home Exercise Plan TrA only    Consulted and Agree with Plan of Care Patient          G-Codes - Feb 28, 2015 1235    Functional Assessment Tool Used FOTO   Functional Limitation Mobility: Walking and moving around   Mobility: Walking and Moving Around Current Status [id number]) At least 60 percent but less than 80 percent impaired, limited or restricted   Mobility: Walking and Moving Around Goal Status ([id number]) At least 40 percent but less than 60 percent impaired, limited or restricted       Problem List There are no active problems to display for this patient.   PAA,JENNIFER 2015/02/28, 12:38 PM  [facility] [address] [place], [place], [address] Phone: [phone]   Fax:  [phone]

## 2015-02-05 NOTE — Patient Instructions (Signed)
Instructed patient on Transverse Abdominus activation, core control and administered a handout with basic lumbar stabilization exercises, breathing.

## 2015-02-07 ENCOUNTER — Ambulatory Visit: Payer: Medicare Other | Admitting: Physical Therapy

## 2015-02-07 DIAGNOSIS — M546 Pain in thoracic spine: Secondary | ICD-10-CM | POA: Diagnosis not present

## 2015-02-07 NOTE — Therapy (Signed)
[facility] [address] [place], [place], [address] Phone: [phone]   Fax:  [phone]  Physical Therapy Treatment  Patient Details  Name: Ashlee [name] MRN: [id number] Date of Birth: [birth year] Referring Provider:  Dorita Fray, MD  Encounter Date: 02/07/2015      PT End of Session - 02/07/15 1806    Visit Number 2   Number of Visits 16   Date for PT Re-Evaluation 04/02/15   PT Start Time 1420   PT Stop Time 1455   PT Time Calculation (min) 35 min   Activity Tolerance Patient tolerated treatment well      Past Medical History  Diagnosis Date  . Scoliosis   . Rheumatoid arthritis(714.0)   . Mitral valve prolapse   . Patent foramen ovale   . Back pain   . Restless leg syndrome   . Spinal cord disease   . Sciatica     Past Surgical History  Procedure Laterality Date  . Back surgery    . Cardiac catheterization      There were no vitals filed for this visit.  Visit Diagnosis:  Left-sided thoracic back pain      Subjective Assessment - 02/07/15 1803    Currently in Pain? Yes   Pain Score 4    Pain Location Back   Pain Orientation Mid;Left;Lower   Pain Descriptors / Indicators Sore   Aggravating Factors  standing certain activity   Pain Relieving Factors pain meds, heat ice   Multiple Pain Sites No                       [facility] Adult PT Treatment/Exercise - 02/07/15 1415    Manual Therapy   Manual Therapy --  soft tissue work Lt>RT able to soften tissue, low back retig                  PT Short Term Goals - 02/05/15 1224    PT SHORT TERM GOAL #1   Title Pt will be able to report less pain with transitions (bed mobility), coughing, deep breathing (25% improved)   Time 4   Period Weeks   Status New   PT SHORT TERM GOAL #2   Title Pt will be I with HEP for spinal stabilization   Time 4   Period Weeks   Status New   PT SHORT TERM GOAL #3   Title Pt will be able to  tolerate standing for up to 10 min most days of the week, pain <3/10 in low, middle back   Time 4   Period Weeks   Status New           PT Long Term Goals - 02/05/15 1227    PT LONG TERM GOAL #1   Title Pt will be I with advanced HEP   Time 8   Period Weeks   Status New   PT LONG TERM GOAL #2   Title Pt will be able to walk as needed in the community with LRAD with pain > 3/10 most of the time.    Time 8   Period Weeks   Status New   PT LONG TERM GOAL #3   Title Pt will report min decrease in LE pain with household activities.    Time 8   Period Weeks   Status New   PT LONG TERM GOAL #4   Title Pt will increase hip abd strength to 3+/5 bilateral  to ease walking.    Time 8   Period Weeks   Status New   PT LONG TERM GOAL #5   Title Pt will score FOTO for L spine < 48% limited to demo functional improvement                Plan - 02/07/15 1807    Clinical Impression Statement shortened visit patient needed to use the bathroom after she got on the table.  Tissue stiff initially.  and softened everywhere .  Low back Lt tightness returned post .  Some lateral pain reported post session.   PT Next Visit Plan .  Assess soft tissue work and continue if benificial, if not , stabilization   Consulted and Agree with Plan of Care Patient        Problem List There are no active problems to display for this patient. Liz Beach, PTA 02/07/2015 6:11 PM Phone: [phone] Fax: [phone]   Liz Beach 02/07/2015, 6:11 PM  [facility] [address] [place], [place], [address] Phone: [phone]   Fax:  [phone]

## 2015-02-13 ENCOUNTER — Ambulatory Visit: Payer: Medicare Other | Admitting: Physical Therapy

## 2015-02-13 DIAGNOSIS — M546 Pain in thoracic spine: Secondary | ICD-10-CM

## 2015-02-13 NOTE — Therapy (Signed)
[facility] [address] [place], [place], [address] Phone: [phone]   Fax:  [phone]  Physical Therapy Treatment  Patient Details  Name: Ashlee [name] MRN: [id number] Date of Birth: June 04, 1986 Referring Provider:  Dorita Fray, MD  Encounter Date: 02/13/2015      PT End of Session - 02/13/15 1258    Visit Number 3   Number of Visits 16   Date for PT Re-Evaluation 04/02/15   PT Start Time 0935   PT Stop Time 1026   PT Time Calculation (min) 51 min   Activity Tolerance Patient tolerated treatment well      Past Medical History  Diagnosis Date  . Scoliosis   . Rheumatoid arthritis(714.0)   . Mitral valve prolapse   . Patent foramen ovale   . Back pain   . Restless leg syndrome   . Spinal cord disease   . Sciatica     Past Surgical History  Procedure Laterality Date  . Back surgery    . Cardiac catheterization      There were no vitals filed for this visit.  Visit Diagnosis:  Left-sided thoracic back pain      Subjective Assessment - 02/13/15 0939    Subjective Am less pain.     Pain Score 3    Pain Location Back   Pain Orientation Lower;Mid;Left;Right   Pain Radiating Towards Rt.Lt Legs   Aggravating Factors  as the day goes on gets worse   Multiple Pain Sites No                       [facility] Adult PT Treatment/Exercise - 02/13/15 0955    Lumbar Exercises: Stretches   Passive Hamstring Stretch --  using strap   Passive Hamstring Stretch Limitations also demonstrated sitting with leg on chait for stretch.     ITB Stretch --  using strap   Lumbar Exercises: Seated   Hip Flexion on Ball Both;5 reps   Hip Flexion on Ball Limitations --  sitting on chair   Other Seated Lumbar Exercises Arm/ posture exercises added to home exerise.  Narrow, wide arms flexion, ER, Horizontal abduction, and diagonals, yellow, seated unsuported on chair, after instruction.   Lumbar Exercises: Sidelying   Clam --  cues for transversus abdominis activation.   Other Sidelying Lumbar Exercises transversus abdominus activation with instruction                PT Education - 02/13/15 1258    Education provided Yes   Education Details Arm posture exercises with bands.     Person(s) Educated Patient   Methods Explanation;Demonstration;Verbal cues;Handout   Comprehension Verbalized understanding;Returned demonstration;Need further instruction          PT Short Term Goals - 02/13/15 1300    PT SHORT TERM GOAL #1   Title Pt will be able to report less pain with transitions (bed mobility), coughing, deep breathing (25% improved)   Baseline no change yet   Time 4   Period Weeks   Status On-going   PT SHORT TERM GOAL #2   Title Pt will be I with HEP for spinal stabilization   Time 4   Period Weeks   Status On-going   PT SHORT TERM GOAL #3   Title Pt will be able to tolerate standing for up to 10 min most days of the week, pain <3/10 in low, middle back   Time 4   Period Weeks  Status On-going           PT Long Term Goals - 02/13/15 1300    PT LONG TERM GOAL #1   Title Pt will be I with advanced HEP   Time 8   Period Weeks   Status On-going   PT LONG TERM GOAL #2   Title Pt will be able to walk as needed in the community with LRAD with pain > 3/10 most of the time.    Time 8   Period Weeks   Status On-going   PT LONG TERM GOAL #3   Title Pt will report min decrease in LE pain with household activities.    Time 8   Period Weeks   Status On-going   PT LONG TERM GOAL #4   Title Pt will increase hip abd strength to 3+/5 bilateral to ease walking.    Time 8   Period Weeks   Status On-going   PT LONG TERM GOAL #5   Time 8   Period Weeks   Status Unable to assess               Plan - 02/13/15 0934    Clinical Impression Statement Patient to get MRI this Friday.  Last session not sure if it made a difference.        Problem List There are no active  problems to display for this patient. Liz Beach, PTA 02/13/2015 1:02 PM Phone: [phone] Fax: [phone]   [facility] 02/13/2015, 1:02 PM  [facility] [address] [place], [place], [address] Phone: [phone]   Fax:  [phone]

## 2015-02-14 ENCOUNTER — Ambulatory Visit: Payer: Medicare Other | Admitting: Physical Therapy

## 2015-02-14 DIAGNOSIS — M546 Pain in thoracic spine: Secondary | ICD-10-CM

## 2015-02-14 DIAGNOSIS — Z4782 Encounter for orthopedic aftercare following scoliosis surgery: Secondary | ICD-10-CM

## 2015-02-14 NOTE — Therapy (Signed)
[place], [place], [address] Phone: [phone]   Fax:  [phone]  Physical Therapy Treatment  Patient Details  Name: Ashlee [name] MRN: [id number] Date of Birth: 12-04-1985 Referring Provider:  Rowe Pavy, MD  Encounter Date: 02/14/2015      PT End of Session - 02/14/15 1430    Visit Number 4   Number of Visits 16   Date for PT Re-Evaluation 04/02/15   PT Start Time 5329   PT Stop Time 1420   PT Time Calculation (min) 47 min   Activity Tolerance Patient tolerated treatment well      Past Medical History  Diagnosis Date  . Scoliosis   . Rheumatoid arthritis(714.0)   . Mitral valve prolapse   . Patent foramen ovale   . Back pain   . Restless leg syndrome   . Spinal cord disease   . Sciatica     Past Surgical History  Procedure Laterality Date  . Back surgery    . Cardiac catheterization      There were no vitals filed for this visit.  Visit Diagnosis:  Left-sided thoracic back pain  Encounter for orthopedic aftercare following scoliosis surgery      Subjective Assessment - 02/14/15 1322    Subjective Lateral rib soreness after PT and after elliptical st home.   Currently in Pain? No/denies                       [facility] Adult PT Treatment/Exercise - 02/14/15 1333    Lumbar Exercises: Seated   Other Seated Lumbar Exercises Band, narrow , wide D1, ER, Horizontal abduction, yellow bands 10 reps at edge of bed.   Lumbar Exercises: Supine   Bridge 10 reps  legs over bolster   Straight Leg Raises Limitations 5   from high bolster   Lumbar Exercises: Sidelying   Clam 10 reps  cues   Lumbar Exercises: Quadruped   Single Arm Raise 10 reps   Straight Leg Raise 5 reps  bent knee   Shoulder Exercises: Sidelying   External Rotation Weight (lbs) 3 Lbs   ABduction Weight (lbs) 3 Lbs 10                PT Education - 02/13/15 1258    Education provided Yes    Education Details Arm posture exercises with bands.     Person(s) Educated Patient   Methods Explanation;Demonstration;Verbal cues;Handout   Comprehension Verbalized understanding;Returned demonstration;Need further instruction          PT Short Term Goals - 02/14/15 1405    PT SHORT TERM GOAL #1   Baseline intermittant not as intense   Time 4   Period Weeks   Status On-going   PT SHORT TERM GOAL #2   Title Pt will be I with HEP for spinal stabilization   Time 4   Period Weeks   Status On-going   PT SHORT TERM GOAL #3   Title Pt will be able to tolerate standing for up to 10 min most days of the week, pain <3/10 in low, middle back   Time 4   Period Weeks   Status On-going           PT Long Term Goals - 02/13/15 1300    PT LONG TERM GOAL #1   Title Pt will be I with advanced HEP   Time 8   Period Weeks   Status On-going  PT LONG TERM GOAL #2   Title Pt will be able to walk as needed in the community with LRAD with pain > 3/10 most of the time.    Time 8   Period Weeks   Status On-going   PT LONG TERM GOAL #3   Title Pt will report min decrease in LE pain with household activities.    Time 8   Period Weeks   Status On-going   PT LONG TERM GOAL #4   Title Pt will increase hip abd strength to 3+/5 bilateral to ease walking.    Time 8   Period Weeks   Status On-going   PT LONG TERM GOAL #5   Time 8   Period Weeks   Status Unable to assess               Plan - 02/14/15 1431    Clinical Impression Statement No increased pain noted at end of session, intermittant brief pain during exercises.  No new goals met.   PT Next Visit Plan quadriped, and sitting exercises        Problem List There are no active problems to display for this patient. Melvenia Needles, PTA 02/14/2015 2:33 PM Phone: [phone] Fax: [phone]   [facility] 02/14/2015, 2:33 PM  [facility] [address] [place], [place], [address] Phone: [phone]   Fax:  [phone]

## 2015-02-19 ENCOUNTER — Ambulatory Visit: Payer: Medicare Other | Admitting: Physical Therapy

## 2015-02-19 DIAGNOSIS — M546 Pain in thoracic spine: Secondary | ICD-10-CM

## 2015-02-19 DIAGNOSIS — M4120 Other idiopathic scoliosis, site unspecified: Secondary | ICD-10-CM

## 2015-02-19 NOTE — Patient Instructions (Signed)
Calf stretch from exercise drawer

## 2015-02-19 NOTE — Therapy (Signed)
[facility] [address] [place], [place], [address] Phone: [phone]   Fax:  [phone]  Physical Therapy Treatment  Patient Details  Name: FREDIA CHITTENDEN MRN: [id number] Date of Birth: [birth year] Referring Provider:  Dorita Fray, MD  Encounter Date: 02/19/2015      PT End of Session - 02/19/15 1748    Visit Number 5   Number of Visits 16   Date for PT Re-Evaluation 04/02/15   PT Start Time 1545   PT Stop Time 1637   PT Time Calculation (min) 52 min   Activity Tolerance Patient tolerated treatment well      Past Medical History  Diagnosis Date  . Scoliosis   . Rheumatoid arthritis(714.0)   . Mitral valve prolapse   . Patent foramen ovale   . Back pain   . Restless leg syndrome   . Spinal cord disease   . Sciatica     Past Surgical History  Procedure Laterality Date  . Back surgery    . Cardiac catheterization      There were no vitals filed for this visit.  Visit Diagnosis:  Left-sided thoracic back pain  Idiopathic scoliosis      Subjective Assessment - 02/19/15 1615    Subjective Lt ribs creak intermittantly with increased frequency  Hard for her to give an answer for pain #   New order for her knee. MRI negative   Currently in Pain? No/denies   Pain Descriptors / Indicators --  creaky Lt upper ribs   Pain Radiating Towards lsteral ribs   Aggravating Factors  worse as day goes on   Pain Relieving Factors pain meds heat ice                         [facility] Adult PT Treatment/Exercise - 02/19/15 1600    Lumbar Exercises: Stretches   Hip Flexor Stretch --  prone lower thighs on rolled towels 2 reps 1 minute   Quad Stretch 3 reps;30 seconds  passive   Lumbar Exercises: Seated   Other Seated Lumbar Exercises Band as on 02/14/2015  These peformed supine in hooklying   Lumbar Exercises: Quadruped   Madcat/Old Horse --  1 rep Cat   Single Arm Raise 10 reps   Straight Leg Raise 10  reps    Also posture exercises using yellow theraband  10 reps each of ER, D1, Horizontal abduction at 90 degrees, narrow and wide band pulls into flexion.            PT Education - 02/19/15 1615    Education provided Yes   Education Details calf stretch   Person(s) Educated Patient   Methods Explanation;Demonstration;Handout;Tactile cues   Comprehension Verbalized understanding;Returned demonstration          PT Short Term Goals - 02/19/15 1751    PT SHORT TERM GOAL #1   Title Pt will be able to report less pain with transitions (bed mobility), coughing, deep breathing (25% improved)   Time 4   Period Weeks   Status On-going   PT SHORT TERM GOAL #2   Title Pt will be I with HEP for spinal stabilization   Time 4   Period Weeks   Status On-going   PT SHORT TERM GOAL #3   Title Pt will be able to tolerate standing for up to 10 min most days of the week, pain <3/10 in low, middle back   Time 4  Period Weeks   Status On-going           PT Long Term Goals - 02/13/15 1300    PT LONG TERM GOAL #1   Title Pt will be I with advanced HEP   Time 8   Period Weeks   Status On-going   PT LONG TERM GOAL #2   Title Pt will be able to walk as needed in the community with LRAD with pain > 3/10 most of the time.    Time 8   Period Weeks   Status On-going   PT LONG TERM GOAL #3   Title Pt will report min decrease in LE pain with household activities.    Time 8   Period Weeks   Status On-going   PT LONG TERM GOAL #4   Title Pt will increase hip abd strength to 3+/5 bilateral to ease walking.    Time 8   Period Weeks   Status On-going   PT LONG TERM GOAL #5   Time 8   Period Weeks   Status Unable to assess               Plan - 02/19/15 1749    Clinical Impression Statement progress toward home exercise goals, no new functional or pain goals yet noted.  Patient needs frequant cues to keep on task.   PT Next Visit Plan review calf stretch, continue  stretching hip , stabilization         Problem List There are no active problems to display for this patient.  Liz Beach, PTA 02/19/2015 5:55 PM Phone: [phone] Fax: [phone]  [facility] 02/19/2015, 5:55 PM  [facility] [address] [place], [place], [address] Phone: [phone]   Fax:  [phone]

## 2015-02-20 ENCOUNTER — Ambulatory Visit: Payer: Medicare Other | Admitting: Physical Therapy

## 2015-02-20 DIAGNOSIS — Z4782 Encounter for orthopedic aftercare following scoliosis surgery: Secondary | ICD-10-CM

## 2015-02-20 DIAGNOSIS — M546 Pain in thoracic spine: Secondary | ICD-10-CM

## 2015-02-20 DIAGNOSIS — M4120 Other idiopathic scoliosis, site unspecified: Secondary | ICD-10-CM

## 2015-02-20 NOTE — Therapy (Signed)
[facility] [address] [place], [place], [address] Phone: [phone]   Fax:  [phone]  Physical Therapy Treatment  Patient Details  Name: Ashlee [name] MRN: [id number] Date of Birth: 1986-08-05 Referring Provider:  Dorita Fray, MD  Encounter Date: 02/20/2015      PT End of Session - 02/20/15 1326    Visit Number 6   Number of Visits 16   Date for PT Re-Evaluation 04/02/15   PT Start Time 1017   PT Stop Time 1103   PT Time Calculation (min) 46 min   Activity Tolerance Patient tolerated treatment well      Past Medical History  Diagnosis Date  . Scoliosis   . Rheumatoid arthritis(714.0)   . Mitral valve prolapse   . Patent foramen ovale   . Back pain   . Restless leg syndrome   . Spinal cord disease   . Sciatica     Past Surgical History  Procedure Laterality Date  . Back surgery    . Cardiac catheterization      There were no vitals filed for this visit.  Visit Diagnosis:  Left-sided thoracic back pain  Idiopathic scoliosis  Encounter for orthopedic aftercare following scoliosis surgery      Subjective Assessment - 02/20/15 1022    Subjective Low back pain at end of day with cooking,  Thoracic pain  intermittantly                         [facility] Adult PT Treatment/Exercise - 02/20/15 1030    Lumbar Exercises: Stretches   Passive Hamstring Stretch 3 reps;30 seconds   Single Knee to Chest Stretch 3 reps;30 seconds   Hip Flexor Stretch 3 reps;60 seconds  Towel rolls at knees for stretch   Quad Stretch 3 reps;30 seconds  each   Lumbar Exercises: Sidelying   Clam 10 reps   Shoulder Exercises: Supine   Theraband Level (Shoulder External Rotation) Level 2 (Red)  20reps   Shoulder Exercises: Sidelying   Internal Rotation Limitations sleeper's stretch 1 rep 30 seconds, non tight bilateral                PT Education - 02/19/15 1615    Education provided Yes   Education  Details calf stretch   Person(s) Educated Patient   Methods Explanation;Demonstration;Handout;Tactile cues   Comprehension Verbalized understanding;Returned demonstration          PT Short Term Goals - 02/19/15 1751    PT SHORT TERM GOAL #1   Title Pt will be able to report less pain with transitions (bed mobility), coughing, deep breathing (25% improved)   Time 4   Period Weeks   Status On-going   PT SHORT TERM GOAL #2   Title Pt will be I with HEP for spinal stabilization   Time 4   Period Weeks   Status On-going   PT SHORT TERM GOAL #3   Title Pt will be able to tolerate standing for up to 10 min most days of the week, pain <3/10 in low, middle back   Time 4   Period Weeks   Status On-going           PT Long Term Goals - 02/13/15 1300    PT LONG TERM GOAL #1   Title Pt will be I with advanced HEP   Time 8   Period Weeks   Status On-going   PT LONG TERM GOAL #2  Title Pt will be able to walk as needed in the community with LRAD with pain > 3/10 most of the time.    Time 8   Period Weeks   Status On-going   PT LONG TERM GOAL #3   Title Pt will report min decrease in LE pain with household activities.    Time 8   Period Weeks   Status On-going   PT LONG TERM GOAL #4   Title Pt will increase hip abd strength to 3+/5 bilateral to ease walking.    Time 8   Period Weeks   Status On-going   PT LONG TERM GOAL #5   Time 8   Period Weeks   Status Unable to assess               Plan - 02/20/15 1327    Clinical Impression Statement Continued stretched and strengthening,  No real functional changes yet noted.  Patient noted Rt hip    PT Next Visit Plan IT band stretching   Consulted and Agree with Plan of Care Patient        Problem List There are no active problems to display for this patient.   [facility] 02/20/2015, 1:39 PM  [facility] [address] [place], [place], [address] Phone:  [phone]   Fax:  [phone]   Liz Beach, PTA 02/20/2015 1:39 PM Phone: [phone] Fax: [phone]

## 2015-02-26 ENCOUNTER — Ambulatory Visit: Payer: Medicare Other | Admitting: Physical Therapy

## 2015-02-26 ENCOUNTER — Encounter: Payer: Self-pay | Admitting: Physical Therapy

## 2015-02-26 DIAGNOSIS — R262 Difficulty in walking, not elsewhere classified: Secondary | ICD-10-CM

## 2015-02-26 DIAGNOSIS — M25561 Pain in right knee: Principal | ICD-10-CM

## 2015-02-26 DIAGNOSIS — R29898 Other symptoms and signs involving the musculoskeletal system: Secondary | ICD-10-CM

## 2015-02-26 DIAGNOSIS — G8929 Other chronic pain: Secondary | ICD-10-CM

## 2015-02-26 DIAGNOSIS — M546 Pain in thoracic spine: Secondary | ICD-10-CM | POA: Diagnosis not present

## 2015-02-26 NOTE — Patient Instructions (Signed)
Calf Stretch   With towel around forefoot, keep knee straight and pull back on towel until a stretch is felt in the calf. Hold __30_ seconds. Repeat __2-4__ times. Do __2__ sessions per day.  Copyright  VHI. All rights reserved.   Hamstring Step 1   Straighten left knee. Keep knee level with other knee or on bolster. Hold _30__ seconds. Relax knee by returning foot to start. Repeat _3__ times.  Copyright  VHI. All rights reserved.   Hip Flexion / Knee Extension: Straight-Leg Raise (Eccentric)   Lie on back. Lift leg with knee straight. Slowly lower leg for 3-5 seconds. __10_ reps per set, _2-3__ sets per day, _5-6__ days per week. Lower like elevator, stopping at each floor. Rest on elbows. Rest on straight arms.  Copyright  VHI. All rights reserved.

## 2015-02-26 NOTE — Therapy (Addendum)
[facility] [address] [place], [place], [address] Phone: [phone]   Fax:  [phone]  Physical Therapy Evaluation  Patient Details  Name: Ashlee [name] MRN: [id number] Date of Birth: [birth year] Referring Provider:  Dorita Fray, MD  Dwana Melena, NP  Encounter Date: 02/26/2015      PT End of Session - 02/26/15 1732    Visit Number 7  1st visit for the knee   Number of Visits 16   Date for PT Re-Evaluation 04/02/15  04/23/15 for the knee re-eval   PT Start Time 1551   PT Stop Time 1643   PT Time Calculation (min) 52 min   Activity Tolerance Patient tolerated treatment well;No increased pain   Behavior During Therapy [facility] for tasks assessed/performed      Past Medical History  Diagnosis Date  . Scoliosis   . Rheumatoid arthritis(714.0)   . Mitral valve prolapse   . Patent foramen ovale   . Back pain   . Restless leg syndrome   . Spinal cord disease   . Sciatica     Past Surgical History  Procedure Laterality Date  . Back surgery    . Cardiac catheterization      There were no vitals filed for this visit.  Visit Diagnosis:  Knee pain, chronic, right  Decreased strength involving knee joint  Tight unbalanced musculature  Difficulty walking up stairs  Weakness of both hips      Subjective Assessment - 02/26/15 1650    Subjective Ashlee [name] presents today with new refferal for evaluation of her knee. She states that her main problem is hyperextension of the knee, which causes pain and discomfort during prolonged standing, walkin, and exercising on eleptical. States that knee moves laterally during training on the eliptical. Does not rate her pain, says it is intermittent.      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  Total # surgeries is 4.  angina, heart murmur, IBS, RA, anemia, RLS. RA of both knees, currently in remission, per pt.       Limitations Standing;House hold activities;Walking   How long can you sit comfortably? does not aggravate   How long can you stand comfortably? does not like to stand still incr LE fatigue   How long can you walk comfortably? variable, depends on pain   Diagnostic tests MRI, negative   Patient Stated Goals Wants to strengthen musciulature around the knee, stretch tight muscles, and relieve pain.    Currently in Pain? Yes   Pain Score --  Not rated, states moderate pain comes on during weight bearing   Pain Location Knee   Pain Orientation Right   Pain Descriptors / Indicators Sharp;Aching   Pain Type Chronic pain   Pain Onset More than a month ago   Pain Frequency Intermittent   Aggravating Factors  weight bearing   Pain Relieving Factors rest, pain meds   Effect of Pain on Daily Activities Limited   Multiple Pain Sites --  Back not assessed          [facility] PT Assessment - 02/26/15 1659    Assessment   Medical Diagnosis Rt knee pain   Precautions   Precautions None   Restrictions   Weight Bearing Restrictions No   Balance Screen   Has the patient fallen in the past 6 months Yes   How many times? 2  Has the patient had a decrease in activity level because of a fear of falling?  No   Is the patient reluctant to leave their home because of a fear of falling?  No   Home Environment   Living Enviornment Private residence   Living Arrangements Alone   Available Help at Discharge Family   Type of Home Apartment   Home Access Stairs to enter   Entrance Stairs-Number of Steps 2   Entrance Stairs-Rails Can reach both   Home Layout One level   Home Equipment Walker - standard   Additional Comments stairs are difficult, step to pattern  with leading Lt leg   Prior Function   Level of Independence Independent with basic ADLs   Cognition   Overall Cognitive Status Within Functional Limits for tasks assessed   Observation/Other Assessments   Observations No visible swelling    Skin Integrity WNL   Focus on Therapeutic Outcomes (FOTO)  Not assessed   Sensation   Light Touch Appears Intact   Coordination   Gross Motor Movements are Fluid and Coordinated Yes   AROM   Right Knee Extension 0   Right Knee Flexion 140   Right/Left Ankle --  wears AFOs bilaterally   Right Ankle Dorsiflexion 0   Right Ankle Plantar Flexion --  WFL   Right Ankle Inversion --  WFL   Right Ankle Eversion --  [facility]   Strength   Right Hip Flexion 3-/5   Right Hip Extension 2+/5   Right Hip ABduction 2/5   Right Hip ADduction 2/5   Left Hip Flexion 3-/5   Left Hip Extension 2+/5   Left Hip ABduction 2+/5   Left Hip ADduction 2/5   Right Knee Flexion 4-/5   Right Knee Extension 3+/5   Left Knee Flexion 4/5   Left Knee Extension 4-/5   Right Ankle Dorsiflexion 2+/5   Right Ankle Plantar Flexion 3/5   Left Ankle Dorsiflexion 4-/5   Left Ankle Plantar Flexion 3/5   Flexibility   Hamstrings Rt. tighter than L.    ITB Rt. tighter than L.    Palpation   Palpation tender at joint line medial and lateral sides   Ambulation/Gait   Ambulation/Gait Yes   Ambulation/Gait Assistance 6: Modified independent (Device/Increase time)   Assistive device Standard walker   Gait Pattern Decreased stride length;Right foot flat;Left foot flat;Right flexed knee in stance;Left flexed knee in stance;Ataxic;Trunk flexed;Poor foot clearance - right   Ambulation Surface Level   Gait Comments walks with knees flexed , reports Rt. knee hyperextends   Balance   Balance Assessed No          [facility] Adult PT Treatment/Exercise - 02/26/15 1659    Knee/Hip Exercises: Stretches   Active Hamstring Stretch 2 reps;30 seconds   ITB Stretch 1 rep;60 seconds   Gastroc Stretch 2 reps;30 seconds   Knee/Hip Exercises: Supine   Straight Leg Raises AROM;Strengthening;Right;2 sets;10 reps  with quad and abdominal activation          PT Education - 02/26/15 1722    Education provided Yes   Education  Details HEP: gastroc stratch, hamstring stretch, SLR   Person(s) Educated Patient   Methods Explanation;Demonstration;Tactile cues;Verbal cues;Handout   Comprehension Verbalized understanding;Returned demonstration;Verbal cues required          PT Short Term Goals - 02/26/15 1722    PT SHORT TERM GOAL #4   Title Pt's Rt knee flexion/ext will improve to 4+/5 to stabilize the knee  during wt bearing activities   Time 4   Period Weeks   Status New   PT SHORT TERM GOAL #5   Title Pt will report 25% less occurances/day of hyperextension of Rt. knee in order to improve bimechanically correct gait mechanics to prevent knee injury    Time 4   Period Weeks   Status New           PT Long Term Goals - 02/26/15 1729    Additional Long Term Goals   Additional Long Term Goals Yes   PT LONG TERM GOAL #6   Title Pt will be able to use gym equipment without pain increase.     Time 8   Period Weeks   Status --   PT LONG TERM GOAL #7   Title Pt will report 50% less occurances/day of hyperextension of Rt. knee in order to improve bimechanically correct gait mechanics to prevent knee injury    Time 8   Period Weeks   Status New   PT LONG TERM GOAL #8   Title Berg balance goal to be set           Plan - 02/26/15 1735    Clinical Impression Statement Shy is present today to OPPT for Rt knee pain. Upon evaluation we found wekness in Rt. knee, Rt. hip, and Rt. ankle muscles, tight gastrocnemius and IT band all may be contributing to continues hyperextension of the Rt. knee during weight bearing activities. Pt will benefit from skilled PT to improve strength, of weakned muscles, stretch tight musclulature to improve control of Rt. knee to prevent hyperextension and potential knee injury.    Pt will benefit from skilled therapeutic intervention in order to improve on the following deficits Abnormal gait;Decreased activity tolerance;Decreased balance;Decreased mobility;Decreased  strength;Pain;Impaired UE functional use;Decreased knowledge of use of DME;Increased fascial restricitons;Impaired tone;Postural dysfunction;Impaired sensation;Difficulty walking;Impaired flexibility;Decreased range of motion   Rehab Potential Good   PT Frequency 2x / week   PT Duration 8 weeks   PT Treatment/Interventions ADLs/Self Care Home Management;Moist Heat;Therapeutic activities;Patient/family education;Passive range of motion;Therapeutic exercise;DME Instruction;Ultrasound;Gait training;Balance training;Manual techniques;Neuromuscular re-education;Cryotherapy;Functional mobility training;Stair training;Electrical Stimulation   PT Next Visit Plan Berg balance for knee and balance, Rt. knee and hip stretches and strengthening. Assess HEP.    PT Home Exercise Plan HEP, see above   Consulted and Agree with Plan of Care Patient         Problem List There are no active problems to display for this patient.   PAA,JENNIFER 02/27/2015, 8:45 AM  [facility] [address] [place], [place], [address] Phone: [phone]   Fax:  [phone]

## 2015-03-01 ENCOUNTER — Ambulatory Visit: Payer: Medicare Other | Admitting: Physical Therapy

## 2015-03-01 DIAGNOSIS — M4120 Other idiopathic scoliosis, site unspecified: Secondary | ICD-10-CM

## 2015-03-01 DIAGNOSIS — M546 Pain in thoracic spine: Secondary | ICD-10-CM | POA: Diagnosis not present

## 2015-03-01 DIAGNOSIS — R29898 Other symptoms and signs involving the musculoskeletal system: Secondary | ICD-10-CM

## 2015-03-01 DIAGNOSIS — Z4782 Encounter for orthopedic aftercare following scoliosis surgery: Secondary | ICD-10-CM

## 2015-03-01 NOTE — Therapy (Signed)
[place], [place], [address] Phone: [phone]   Fax:  [phone]  Physical Therapy Treatment  Patient Details  Name: Ashlee [name] MRN: [id number] Date of Birth: Dec 26, 1985 Referring Provider:  Rowe Pavy, MD  Encounter Date: 03/01/2015      PT End of Session - 03/01/15 1048    Visit Number 7  back   Number of Visits 16   Date for PT Re-Evaluation 04/02/15   PT Start Time 4888   PT Stop Time 9169   PT Time Calculation (min) 50 min   Activity Tolerance Patient tolerated treatment well;No increased pain   Behavior During Therapy [facility] for tasks assessed/performed      Past Medical History  Diagnosis Date  . Scoliosis   . Rheumatoid arthritis(714.0)   . Mitral valve prolapse   . Patent foramen ovale   . Back pain   . Restless leg syndrome   . Spinal cord disease   . Sciatica     Past Surgical History  Procedure Laterality Date  . Back surgery    . Cardiac catheterization      There were no vitals filed for this visit.  Visit Diagnosis:  Tight unbalanced musculature  Weakness of both hips  Left-sided thoracic back pain  Idiopathic scoliosis  Encounter for orthopedic aftercare following scoliosis surgery      Subjective Assessment - 03/01/15 1017    Subjective Rt. knee 5/10 and L back 3/10.  No other new complaints.    Currently in Pain? Yes  See above for ratings           [facility] Adult PT Treatment/Exercise - 03/01/15 1020    Lumbar Exercises: Stretches   Passive Hamstring Stretch 1 rep;30 seconds   Lumbar Exercises: Supine   Ab Set 10 reps;5 seconds   Clam 10 reps   Bridge 10 reps   Bridge Limitations 2 sets, 1 with balll   Other Supine Lumbar Exercises lower trunk rotation with ball  oblliques   Other Supine Lumbar Exercises heel slides with ball x 10    Knee/Hip Exercises: Stretches   Active Hamstring Stretch 2 reps;30 seconds   ITB Stretch 2 reps;30 seconds   hip adductor stretch x 30 sec x 3   Gastroc Stretch 2 reps;30 seconds   Knee/Hip Exercises: Aerobic   Stationary Bike NuStep level 4, 3 min UE and LE, 3 min LE only   Knee/Hip Exercises: Supine   Bridges --   Straight Leg Raises Strengthening;1 set   Straight Leg Raises Limitations 25 deg quad lag   Manual Therapy   Myofascial Release in sidelying for L lateral trunk, elongation techniques to toleration over 2 pillows                  PT Short Term Goals - 03/01/15 1022    PT SHORT TERM GOAL #1   Title Pt will be able to report less pain with transitions (bed mobility), coughing, deep breathing (25% improved)   Baseline continues to improve, still happens frequently   Status On-going   PT SHORT TERM GOAL #2   Title Pt will be I with HEP for spinal stabilization   Status On-going   PT SHORT TERM GOAL #3   Title Pt will be able to tolerate standing for up to 10 min most days of the week, pain <3/10 in low, middle back   Status On-going   PT SHORT TERM GOAL #4   Title  Pt's Rt knee flexion/ext will improve to 4+/5 to stabilize the knee during wt bearing activities   Status On-going   PT SHORT TERM GOAL #5   Title Pt will report 25% less occurances/day of hyperextension of Rt. knee in order to improve bimechanically correct gait mechanics to prevent knee injury    Status On-going           PT Long Term Goals - 03/01/15 1023    PT LONG TERM GOAL #1   Title Pt will be I with advanced HEP   Status On-going   PT LONG TERM GOAL #2   Title Pt will be able to walk as needed in the community with LRAD with pain > 3/10 most of the time.    Status On-going   PT LONG TERM GOAL #3   Title Pt will report min decrease in LE pain with household activities.    Status On-going   PT LONG TERM GOAL #4   Title Pt will increase hip abd strength to 3+/5 bilateral to ease walking.    Status On-going   PT LONG TERM GOAL #5   Title Pt will score FOTO for L spine < 48% limited to demo  functional improvement    Status On-going   PT LONG TERM GOAL #6   Title Pt will be able to use gym equipment without pain increase.     Status On-going   PT LONG TERM GOAL #7   Title Pt will report 50% less occurances/day of hyperextension of Rt. knee in order to improve bimechanically correct gait mechanics to prevent knee injury    Status On-going   PT LONG TERM GOAL #8   Title Berg balance goal to be set   Status Unable to assess               Plan - 03/01/15 1120    Clinical Impression Statement Patient able to tolerate supine stab exercises without pain increase.  Sidelying stretch for L. side recommended daily in addition to HEP.  Rt. knee PROM to -10 deg. Quad lag 25deg for SLR.  No further goals met.    PT Next Visit Plan Berg balance for knee and balance. Next visit KNEE   PT Home Exercise Plan Cont as previous        Problem List There are no active problems to display for this patient.   PAA,JENNIFER 03/01/2015, 11:22 AM  [facility] [address] [place], [place], [address] Phone: [phone]   Fax:  [phone]

## 2015-03-05 ENCOUNTER — Ambulatory Visit: Payer: Medicare Other | Attending: Orthopedic Surgery | Admitting: Physical Therapy

## 2015-03-05 DIAGNOSIS — M546 Pain in thoracic spine: Secondary | ICD-10-CM | POA: Diagnosis not present

## 2015-03-05 DIAGNOSIS — M25561 Pain in right knee: Secondary | ICD-10-CM | POA: Insufficient documentation

## 2015-03-05 DIAGNOSIS — M4120 Other idiopathic scoliosis, site unspecified: Secondary | ICD-10-CM

## 2015-03-05 DIAGNOSIS — M6289 Other specified disorders of muscle: Secondary | ICD-10-CM | POA: Diagnosis not present

## 2015-03-05 DIAGNOSIS — R29898 Other symptoms and signs involving the musculoskeletal system: Secondary | ICD-10-CM | POA: Diagnosis not present

## 2015-03-05 DIAGNOSIS — G8929 Other chronic pain: Secondary | ICD-10-CM | POA: Insufficient documentation

## 2015-03-05 DIAGNOSIS — Z4782 Encounter for orthopedic aftercare following scoliosis surgery: Secondary | ICD-10-CM | POA: Diagnosis not present

## 2015-03-05 DIAGNOSIS — R262 Difficulty in walking, not elsewhere classified: Secondary | ICD-10-CM | POA: Insufficient documentation

## 2015-03-05 NOTE — Therapy (Addendum)
[facility] Outpatient Rehabilitation [facility] [address] [place], [place], [address] Phone: [phone]   Fax:  [phone]  Physical Therapy Treatment  Patient Details  Name: Ashlee [name] MRN: [id number] Date of Birth: [birth year] Referring Provider:  Dorita Fray, MD  Encounter Date: 03/05/2015      PT End of Session - 03/05/15 1130    Visit Number 8   Number of Visits 16   Date for PT Re-Evaluation 04/02/15   PT Start Time 1012   PT Stop Time 1116   PT Time Calculation (min) 64 min   Activity Tolerance Patient tolerated treatment well;No increased pain   Behavior During Therapy [facility] for tasks assessed/performed      Past Medical History  Diagnosis Date  . Scoliosis   . Rheumatoid arthritis(714.0)   . Mitral valve prolapse   . Patent foramen ovale   . Back pain   . Restless leg syndrome   . Spinal cord disease   . Sciatica     Past Surgical History  Procedure Laterality Date  . Back surgery    . Cardiac catheterization      There were no vitals filed for this visit.  Visit Diagnosis:  Tight unbalanced musculature  Weakness of both hips  Left-sided thoracic back pain  Idiopathic scoliosis  Encounter for orthopedic aftercare following scoliosis surgery      Subjective Assessment - 03/05/15 1022    Subjective Pain is higher than usual. Had to take Dilaudid whle in the waiting room, pain in mid back that has increased in the last 2 weeks, but not as sharp as it was before, rates at 2-3/10 and  Rt  knee around 6/10 today.    Currently in Pain? Yes   Pain Score 3    Pain Location Back   Pain Orientation Mid   Pain Descriptors / Indicators Aching   Multiple Pain Sites Yes   Pain Score 6   Pain Location Knee   Pain Orientation Right   Pain Descriptors / Indicators Sharp;Aching          [facility] Adult PT Treatment/Exercise - 03/05/15 1048    Lumbar Exercises: Supine   Ab Set 10 reps;3 seconds   Clam 10 reps  with ball under pelvis   Heel Slides 10 reps  with ball under pelvis   Bent Knee Raise 15 reps  with ball under pelvis   Bridge 5 reps;1 second   Straight Leg Raise 10 reps  both legs   Straight Leg Raises Limitations limited ROM, TrA progresssion series   Other Supine Lumbar Exercises hip abduction x 10 both legs with abs   Lumbar Exercises: Sidelying   Clam 15 reps;3 seconds  2 sets, red band, in supine   Knee/Hip Exercises: Aerobic   Stationary Bike NuStep level 4, 6 min LE only     Spent time educating Teriana on engagement of her core musculature during her daily exercises, bending, walking, and going up and down the stairs.         PT Education - 03/05/15 1129    Education provided Yes   Education Details TrA engagemnt with daily activities   Person(s) Educated Patient   Methods Explanation   Comprehension Verbalized understanding          PT Short Term Goals - 03/01/15 1022    PT SHORT TERM GOAL #1   Title Pt will be able to report less pain with transitions (bed mobility), coughing, deep breathing (25% improved)  Baseline continues to improve, still happens frequently   Status On-going   PT SHORT TERM GOAL #2   Title Pt will be I with HEP for spinal stabilization   Status On-going   PT SHORT TERM GOAL #3   Title Pt will be able to tolerate standing for up to 10 min most days of the week, pain <3/10 in low, middle back   Status On-going   PT SHORT TERM GOAL #4   Title Pt's Rt knee flexion/ext will improve to 4+/5 to stabilize the knee during wt bearing activities   Status On-going   PT SHORT TERM GOAL #5   Title Pt will report 25% less occurances/day of hyperextension of Rt. knee in order to improve bimechanically correct gait mechanics to prevent knee injury    Status On-going           PT Long Term Goals - 03/01/15 1023    PT LONG TERM GOAL #1   Title Pt will be I with advanced HEP   Status On-going   PT LONG TERM GOAL #2   Title Pt will be able to walk as needed in the  community with LRAD with pain > 3/10 most of the time.    Status On-going   PT LONG TERM GOAL #3   Title Pt will report min decrease in LE pain with household activities.    Status On-going   PT LONG TERM GOAL #4   Title Pt will increase hip abd strength to 3+/5 bilateral to ease walking.    Status On-going   PT LONG TERM GOAL #5   Title Pt will score FOTO for L spine < 48% limited to demo functional improvement    Status On-going   PT LONG TERM GOAL #6   Title Pt will be able to use gym equipment without pain increase.     Status On-going   PT LONG TERM GOAL #7   Title Pt will report 50% less occurances/day of hyperextension of Rt. knee in order to improve bimechanically correct gait mechanics to prevent knee injury    Status On-going   PT LONG TERM GOAL #8   Title Berg balance goal to be set   Status Unable to assess           Plan - 03/05/15 1131    Clinical Impression Statement Worked on supine stabilization exercises, Kacee tolerated them with decreased dificulty. Limited in hip AROM all directions secondary to spinal cord injury. C/o back pain and knee pain especially during weight  bearing and active motion.    PT Next Visit Plan Berg balance for knee and balance. Next visit KNEE   PT Home Exercise Plan Cont as previous   Consulted and Agree with Plan of Care Patient        Problem List There are no active problems to display for this patient.   PAA,JENNIFER 03/05/2015, 8:52 PM  [facility] [address] [place], [place], [address] Phone: [phone]   Fax:  [phone]

## 2015-03-07 ENCOUNTER — Ambulatory Visit: Payer: Medicare Other | Admitting: Physical Therapy

## 2015-03-07 DIAGNOSIS — R262 Difficulty in walking, not elsewhere classified: Secondary | ICD-10-CM

## 2015-03-07 DIAGNOSIS — M25561 Pain in right knee: Secondary | ICD-10-CM

## 2015-03-07 DIAGNOSIS — G8929 Other chronic pain: Secondary | ICD-10-CM

## 2015-03-07 DIAGNOSIS — R29898 Other symptoms and signs involving the musculoskeletal system: Secondary | ICD-10-CM

## 2015-03-07 DIAGNOSIS — M546 Pain in thoracic spine: Secondary | ICD-10-CM | POA: Diagnosis not present

## 2015-03-07 NOTE — Therapy (Signed)
[facility] [address] [place], [place], [address] Phone: [phone]   Fax:  [phone]  Physical Therapy Treatment  Patient Details  Name: Ashlee [name] MRN: [id number] Date of Birth: [birth year] Referring Provider:  Dorita Fray, MD  Encounter Date: 03/07/2015      PT End of Session - 03/07/15 1106    Visit Number 9   Number of Visits 16   Date for PT Re-Evaluation 04/02/15   PT Start Time 1014   PT Stop Time 1105   PT Time Calculation (min) 51 min   Activity Tolerance Patient tolerated treatment well;No increased pain   Behavior During Therapy [facility] for tasks assessed/performed      Past Medical History  Diagnosis Date  . Scoliosis   . Rheumatoid arthritis(714.0)   . Mitral valve prolapse   . Patent foramen ovale   . Back pain   . Restless leg syndrome   . Spinal cord disease   . Sciatica     Past Surgical History  Procedure Laterality Date  . Back surgery    . Cardiac catheterization      There were no vitals filed for this visit.  Visit Diagnosis:  Difficulty walking up stairs  Decreased strength involving knee joint  Knee pain, chronic, right      Subjective Assessment - 03/07/15 1742    Subjective Ashlee [name] continues to have pain in her back and knee, chooses to work on her knee today. Says that her abdominals were sore after our Tuesday session.    Currently in Pain? Yes   Pain Score 4    Pain Location Knee   Pain Orientation Right   Pain Descriptors / Indicators Aching   Pain Type Chronic pain   Multiple Pain Sites --  back is not rated today           [facility] Adult PT Treatment/Exercise - 03/07/15 1043    Knee/Hip Exercises: Aerobic   Stationary Bike NuStep level 4, 7 min LE only   Knee/Hip Exercises: Standing   Heel Raises 2 sets;15 reps;Limitations  AFOs, limited AROM   Knee Flexion AROM;Both;2 sets;10 reps   Lateral Step Up Both;2 sets;10 reps   Forward Step Up Both;2 sets;15 reps    Functional Squat 3 sets;15 reps  1 set on a foam   SLS on the foam, Lt leg 10-15 seconds x 5, Rt x 3 seconds x 15   Other Standing Knee Exercises hip abduction x 10, 2 sets each leg          PT Education - 03/07/15 1747    Education provided Yes   Education Details HEP: bilat hip abduction   Person(s) Educated Patient   Methods Explanation;Demonstration;Tactile cues;Verbal cues;Handout   Comprehension Verbalized understanding;Returned demonstration          PT Short Term Goals - 03/01/15 1022    PT SHORT TERM GOAL #1   Title Pt will be able to report less pain with transitions (bed mobility), coughing, deep breathing (25% improved)   Baseline continues to improve, still happens frequently   Status On-going   PT SHORT TERM GOAL #2   Title Pt will be I with HEP for spinal stabilization   Status On-going   PT SHORT TERM GOAL #3   Title Pt will be able to tolerate standing for up to 10 min most days of the week, pain <3/10 in low, middle back   Status On-going   PT SHORT TERM GOAL #4  Title Pt's Rt knee flexion/ext will improve to 4+/5 to stabilize the knee during wt bearing activities   Status On-going   PT SHORT TERM GOAL #5   Title Pt will report 25% less occurances/day of hyperextension of Rt. knee in order to improve bimechanically correct gait mechanics to prevent knee injury    Status On-going           PT Long Term Goals - 03/01/15 1023    PT LONG TERM GOAL #1   Title Pt will be I with advanced HEP   Status On-going   PT LONG TERM GOAL #2   Title Pt will be able to walk as needed in the community with LRAD with pain > 3/10 most of the time.    Status On-going   PT LONG TERM GOAL #3   Title Pt will report min decrease in LE pain with household activities.    Status On-going   PT LONG TERM GOAL #4   Title Pt will increase hip abd strength to 3+/5 bilateral to ease walking.    Status On-going   PT LONG TERM GOAL #5   Title Pt will score FOTO for L spine <  48% limited to demo functional improvement    Status On-going   PT LONG TERM GOAL #6   Title Pt will be able to use gym equipment without pain increase.     Status On-going   PT LONG TERM GOAL #7   Title Pt will report 50% less occurances/day of hyperextension of Rt. knee in order to improve bimechanically correct gait mechanics to prevent knee injury    Status On-going   PT LONG TERM GOAL #8   Title Berg balance goal to be set   Status Unable to assess           Plan - 03/07/15 1748    Clinical Impression Statement Ashlee [name] tolerated weight bearing exercises well today, she has significant hip stabilizers weakness, gave her home exercise to strengthen hip abd. G code needs to be updated next visit.    PT Next Visit Plan Berg balance for knee and balance. Next visit back - stabilization and core strengthening. Update goals and G codes!   PT Home Exercise Plan Cont as previous plus hip abd    Consulted and Agree with Plan of Care Patient        Problem List There are no active problems to display for this patient.   Durward Mallard 03/07/2015, 5:53 PM  [facility] [address] [place], [place], [address] Phone: [phone]   Fax:  [phone]

## 2015-03-07 NOTE — Patient Instructions (Signed)
ABDUCTION: Standing (Power)   Stand, feet flat. Lift right leg out to side as quickly as possible. Use _0__ lbs. Complete _2__ sets of 15___ repetitions. Perform __1-2_ sessions per day.  Copyright  VHI. All rights reserved.

## 2015-03-11 ENCOUNTER — Ambulatory Visit: Payer: Medicare Other | Admitting: Physical Therapy

## 2015-03-11 ENCOUNTER — Encounter: Payer: Self-pay | Admitting: Physical Therapy

## 2015-03-11 DIAGNOSIS — R29898 Other symptoms and signs involving the musculoskeletal system: Secondary | ICD-10-CM

## 2015-03-11 DIAGNOSIS — M4120 Other idiopathic scoliosis, site unspecified: Secondary | ICD-10-CM

## 2015-03-11 DIAGNOSIS — M546 Pain in thoracic spine: Secondary | ICD-10-CM

## 2015-03-11 DIAGNOSIS — Z4782 Encounter for orthopedic aftercare following scoliosis surgery: Secondary | ICD-10-CM

## 2015-03-11 NOTE — Therapy (Signed)
[facility] [address] [place], [place], [address] Phone: [phone]   Fax:  [phone]  Physical Therapy Treatment  Patient Details  Name: Ashlee [name] MRN: [id number] Date of Birth: 03-16-1986 Referring Provider:  Dorita Fray, MD  Encounter Date: 03/11/2015      PT End of Session - 03/11/15 1550    Visit Number 10   Number of Visits 16   Date for PT Re-Evaluation 04/02/15   PT Start Time 1510   PT Stop Time 1615   PT Time Calculation (min) 65 min   Activity Tolerance Patient tolerated treatment well   Behavior During Therapy [facility] for tasks assessed/performed      Past Medical History  Diagnosis Date  . Scoliosis   . Rheumatoid arthritis(714.0)   . Mitral valve prolapse   . Patent foramen ovale   . Back pain   . Restless leg syndrome   . Spinal cord disease   . Sciatica     Past Surgical History  Procedure Laterality Date  . Back surgery    . Cardiac catheterization      There were no vitals filed for this visit.  Visit Diagnosis:  Tight unbalanced musculature  Weakness of both hips  Left-sided thoracic back pain  Idiopathic scoliosis  Encounter for orthopedic aftercare following scoliosis surgery      Subjective Assessment - 03/11/15 1514    Subjective Feel pretty good today.  I came here initially for back pain on the left side but now my knee hyperextends and I need to strengthen it.   Pertinent History SCI age 17,  Spine surgeries with broken rods (L5 x 2), she is 11 mo s/p T8 iliac wing revision.  Total # surgeries is 4.  angina, heart murmur, IBS, RA, anemia, RLS. RA of both knees, currently in remission, per pt.      Limitations Standing;House hold activities;Walking   How long can you sit comfortably? does not aggravate   Currently in Pain? Yes   Pain Score 5    Pain Location Back   Pain Orientation Left;Mid   Pain Descriptors / Indicators Other (Comment)  Pinching and pulling   Pain  Type Chronic pain   Pain Onset More than a month ago   Pain Frequency Intermittent            [facility] PT Assessment - 03/11/15 0001    Standardized Balance Assessment   Standardized Balance Assessment Berg Balance Test             [facility] Adult PT Treatment/Exercise - 03/11/15 0001    Berg Balance Test   Sit to Stand Able to stand  independently using hands   Standing Unsupported Able to stand 2 minutes with supervision   Sitting with Back Unsupported but Feet Supported on Floor or Stool Able to sit safely and securely 2 minutes   Stand to Sit Controls descent by using hands   Transfers Able to transfer safely, definite need of hands   Standing Unsupported with Eyes Closed Able to stand 10 seconds with supervision   Standing Ubsupported with Feet Together Able to place feet together independently and stand for 1 minute with supervision   From Standing, Reach Forward with Outstretched Arm Can reach forward >5 cm safely (2")   From Standing Position, Pick up Object from Floor Unable to try/needs assist to keep balance  Unable to try due to precautions   From Standing Position, Turn to Look Behind Over each  Shoulder Needs assist to keep from losing balance and falling  Unable to try due to precautions   Turn 360 Degrees Needs close supervision or verbal cueing   Standing Unsupported, Alternately Place Feet on Step/Stool Able to complete >2 steps/needs minimal assist  Completed 8 steps in 30 seconds but had hand held assist   Standing Unsupported, One Foot in Front Able to take small step independently and hold 30 seconds   Standing on One Leg Able to lift leg independently and hold equal to or more than 3 seconds   Total Score 30   Lumbar Exercises: Supine   Ab Set 10 reps;3 seconds  Verbal cues to focus on abdominal contraction   Clam 10 reps  with ball under pelvis   Heel Slides 10 reps  with foot not touching mat - leg hovering over mat   Bent Knee Raise 15 reps  with ball  under pelvis   Bridge 1 second;15 reps   Straight Leg Raise 10 reps  each leg with verbal cues for avoiding trunk compensation   Other Supine Lumbar Exercises Supine hip abduction in supine with leg hovering over mat x10 each                  PT Short Term Goals - 03/01/15 1022    PT SHORT TERM GOAL #1   Title Pt will be able to report less pain with transitions (bed mobility), coughing, deep breathing (25% improved)   Baseline continues to improve, still happens frequently   Status On-going   PT SHORT TERM GOAL #2   Title Pt will be I with HEP for spinal stabilization   Status On-going   PT SHORT TERM GOAL #3   Title Pt will be able to tolerate standing for up to 10 min most days of the week, pain <3/10 in low, middle back   Status On-going   PT SHORT TERM GOAL #4   Title Pt's Rt knee flexion/ext will improve to 4+/5 to stabilize the knee during wt bearing activities   Status On-going   PT SHORT TERM GOAL #5   Title Pt will report 25% less occurances/day of hyperextension of Rt. knee in order to improve bimechanically correct gait mechanics to prevent knee injury    Status On-going           PT Long Term Goals - 03/01/15 1023    PT LONG TERM GOAL #1   Title Pt will be I with advanced HEP   Status On-going   PT LONG TERM GOAL #2   Title Pt will be able to walk as needed in the community with LRAD with pain > 3/10 most of the time.    Status On-going   PT LONG TERM GOAL #3   Title Pt will report min decrease in LE pain with household activities.    Status On-going   PT LONG TERM GOAL #4   Title Pt will increase hip abd strength to 3+/5 bilateral to ease walking.    Status On-going   PT LONG TERM GOAL #5   Title Pt will score FOTO for L spine < 48% limited to demo functional improvement    Status On-going   PT LONG TERM GOAL #6   Title Pt will be able to use gym equipment without pain increase.     Status On-going   PT LONG TERM GOAL #7   Title Pt will  report 50% less occurances/day of hyperextension of Rt. knee in  order to improve bimechanically correct gait mechanics to prevent knee injury    Status On-going   PT LONG TERM GOAL #8   Title Berg balance goal to be set   Status Unable to assess               Plan - Mar 22, 2015 1636    PT Treatment/Interventions Therapeutic exercise;Neuromuscular re-education          G-Codes - 03/22/15 1621    Functional Assessment Tool Used FOTO   Functional Limitation Other PT subsequent   Mobility: Walking and Moving Around Current Status (E7209) At least 60 percent but less than 80 percent impaired, limited or restricted   Mobility: Walking and Moving Around Goal Status [id number]) At least 40 percent but less than 60 percent impaired, limited or restricted      Problem List There are no active problems to display for this patient.   Bethann Punches, PT 22-Mar-2015, 4:37 PM  [facility] [address] [place], [place], [address] Phone: [phone]   Fax:  [phone]

## 2015-03-13 ENCOUNTER — Encounter: Payer: Self-pay | Admitting: Physical Therapy

## 2015-03-13 ENCOUNTER — Ambulatory Visit: Payer: Medicare Other | Admitting: Physical Therapy

## 2015-03-13 DIAGNOSIS — M25561 Pain in right knee: Secondary | ICD-10-CM

## 2015-03-13 DIAGNOSIS — M4120 Other idiopathic scoliosis, site unspecified: Secondary | ICD-10-CM

## 2015-03-13 DIAGNOSIS — R262 Difficulty in walking, not elsewhere classified: Secondary | ICD-10-CM

## 2015-03-13 DIAGNOSIS — M546 Pain in thoracic spine: Secondary | ICD-10-CM

## 2015-03-13 DIAGNOSIS — G8929 Other chronic pain: Secondary | ICD-10-CM

## 2015-03-13 DIAGNOSIS — Z4782 Encounter for orthopedic aftercare following scoliosis surgery: Secondary | ICD-10-CM

## 2015-03-13 DIAGNOSIS — R29898 Other symptoms and signs involving the musculoskeletal system: Secondary | ICD-10-CM

## 2015-03-13 NOTE — Therapy (Signed)
[place], [place], [address] Phone: [phone]   Fax:  [phone]  Physical Therapy Treatment  Patient Details  Name: Ashlee [name] MRN: [id number] Date of Birth: 1986-02-26 Referring Provider:  Rowe Pavy, MD  Encounter Date: 03/13/2015      PT End of Session - 03/13/15 1802    Visit Number 11   Number of Visits 16   Date for PT Re-Evaluation 04/02/15   PT Start Time 9381   PT Stop Time 1626   PT Time Calculation (min) 39 min      Past Medical History  Diagnosis Date  . Scoliosis   . Rheumatoid arthritis(714.0)   . Mitral valve prolapse   . Patent foramen ovale   . Back pain   . Restless leg syndrome   . Spinal cord disease   . Sciatica     Past Surgical History  Procedure Laterality Date  . Back surgery    . Cardiac catheterization      There were no vitals filed for this visit.  Visit Diagnosis:  No diagnosis found.      Subjective Assessment - 03/13/15 1752    Subjective reports doing HEP but cannot recall them without paper; reports 25-30% better overall with pain (back) since beginning, reports 35% better in trunk strength since beginning   Currently in Pain? Yes   Pain Score 4    Pain Location Back   Pain Orientation Left;Mid   Pain Descriptors / Indicators --  pinching and pulling   Aggravating Factors  weight bearing   Pain Relieving Factors rest, pain meds                         [facility] Adult PT Treatment/Exercise - 03/13/15 0001    Lumbar Exercises: Supine   Heel Slides 10 reps   Heel Slides Limitations x 2 sets on physioball with vc's for L hamstring activation   Bridge 10 reps   Bridge Limitations 2 sets with ball   Straight Leg Raise 10 reps  RLE min vc's to maximize knee extension   Knee/Hip Exercises: Seated   Long Arc Quad Strengthening;10 reps   Knee/Hip Exercises: Supine   Short Arc Quad Sets Strengthening;Right;10 reps      Seated  with green theraband: horizontal abdct and diagonals with vc's for abdominal stabilization x 10 reps each  Discussed fall risk and hx of assistive devices. Pt reports she utilizes a platform RW at home due to her RA and balance. She does not feel comfortable utilizing a RW in the community and therefore chooses a standard walker despite poor energy efficiency. She did utilize two canes at one point but went back to a walker after last surgery to relieve pressure on her spinal rods. PT and pt agree to continue with use of standard walker at this time.           PT Short Term Goals - 03/13/15 1806    PT SHORT TERM GOAL #1   Title Pt will be able to report less pain with transitions (bed mobility), coughing, deep breathing (25% improved)   Baseline reports 25-30% improvement   Time 4   Period Weeks   Status Achieved   PT SHORT TERM GOAL #2   Title Pt will be I with HEP for spinal stabilization   Time 4   Period Weeks   Status On-going   PT SHORT TERM GOAL #3  Title Pt will be able to tolerate standing for up to 10 min most days of the week, pain <3/10 in low, middle back   Baseline reports variable   Time 4   Period Weeks   Status On-going   PT SHORT TERM GOAL #4   Title Pt's Rt knee flexion/ext will improve to 4+/5 to stabilize the knee during wt bearing activities   Time 4   Period Weeks   Status On-going   PT SHORT TERM GOAL #5   Title Pt will report 25% less occurances/day of hyperextension of Rt. knee in order to improve bimechanically correct gait mechanics to prevent knee injury    Time 4   Period Weeks   Status On-going           PT Long Term Goals - 03/13/15 1808    PT LONG TERM GOAL #1   Title Pt will be I with advanced HEP   Time 8   Period Weeks   Status On-going   PT LONG TERM GOAL #2   Title Pt will be able to walk as needed in the community with LRAD with pain > 3/10 most of the time.    Time 8   Period Weeks   Status On-going   PT LONG TERM GOAL  #3   Title Pt will report min decrease in LE pain with household activities.    Time 8   Period Weeks   Status On-going   PT LONG TERM GOAL #4   Title Pt will increase hip abd strength to 3+/5 bilateral to ease walking.    Period Weeks   Status On-going   PT LONG TERM GOAL #5   Title Pt will score FOTO for L spine < 48% limited to demo functional improvement    Time 8   Period Weeks   Status On-going   PT LONG TERM GOAL #6   Title Pt will be able to use gym equipment without pain increase.     Time 8   Period Weeks   Status On-going   PT LONG TERM GOAL #7   Title Pt will report 50% less occurances/day of hyperextension of Rt. knee in order to improve bimechanically correct gait mechanics to prevent knee injury    Period Weeks   Status On-going   PT LONG TERM GOAL #8   Title Berg balance goal to be set   Status Not Met               Plan - 03/13/15 1802    Clinical Impression Statement Pt fatigued with exercises especially bridging and heel slides on physioball but no incr. in pain. She continues to require vc's to avoid compensations. She requires frequent redirection. She reported the reason in the decreased FOTO score is because it involved her knee and not her back and that we had only treated that a couple of visits. Patient is safe with standard walker and will benefit from continued focus on strength - core and RLE. STG1 met. LTG8 Discharged as pt is at high fall risk per Merrilee Jansky but negotating with safe assistive device.    PT Frequency 2x / week   PT Duration 8 weeks   PT Home Exercise Plan continue with core and knee strengthening/flexibility        Problem List There are no active problems to display for this patient.   Ashlee, [name], PT 03/13/2015, 6:10 PM  [place],  [place], [address] Phone: [phone]   Fax:  [phone]

## 2015-03-19 ENCOUNTER — Ambulatory Visit: Payer: Medicare Other | Admitting: Physical Therapy

## 2015-03-19 DIAGNOSIS — G8929 Other chronic pain: Secondary | ICD-10-CM

## 2015-03-19 DIAGNOSIS — R29898 Other symptoms and signs involving the musculoskeletal system: Secondary | ICD-10-CM

## 2015-03-19 DIAGNOSIS — M546 Pain in thoracic spine: Secondary | ICD-10-CM | POA: Diagnosis not present

## 2015-03-19 DIAGNOSIS — Z4782 Encounter for orthopedic aftercare following scoliosis surgery: Secondary | ICD-10-CM

## 2015-03-19 DIAGNOSIS — M25561 Pain in right knee: Secondary | ICD-10-CM

## 2015-03-19 DIAGNOSIS — R262 Difficulty in walking, not elsewhere classified: Secondary | ICD-10-CM

## 2015-03-19 DIAGNOSIS — M4120 Other idiopathic scoliosis, site unspecified: Secondary | ICD-10-CM

## 2015-03-19 NOTE — Therapy (Signed)
[place], [place], [address] Phone: [phone]   Fax:  [phone]  Physical Therapy Treatment  Patient Details  Name: Ashlee [name] MRN: [id number] Date of Birth: 1986-08-20 Referring Provider:  Rowe Pavy, MD  Encounter Date: 03/19/2015      PT End of Session - 03/19/15 1314    Visit Number 12   Number of Visits 16   Date for PT Re-Evaluation 04/02/15   PT Start Time 6962   PT Stop Time 9528   PT Time Calculation (min) 50 min   Activity Tolerance Patient tolerated treatment well      Past Medical History  Diagnosis Date  . Scoliosis   . Rheumatoid arthritis(714.0)   . Mitral valve prolapse   . Patent foramen ovale   . Back pain   . Restless leg syndrome   . Spinal cord disease   . Sciatica     Past Surgical History  Procedure Laterality Date  . Back surgery    . Cardiac catheterization      There were no vitals filed for this visit.  Visit Diagnosis:  Weakness of both hips  Left-sided thoracic back pain  Idiopathic scoliosis  Difficulty walking up stairs  Encounter for orthopedic aftercare following scoliosis surgery  Decreased strength involving knee joint  Knee pain, chronic, right  Tight unbalanced musculature      Subjective Assessment - 03/19/15 1150    Subjective past few days have had sharp pain in Rt. knee Currently 2/10-3/10 and back is 4/10 . Back is not as it was in Feb (mild to mod).    Diagnostic tests XR by Dr. Marykay Lex last month and rods were fine.                          [facility] Adult PT Treatment/Exercise - 03/19/15 1158    Lumbar Exercises: Stretches   Quad Stretch 3 reps;20 seconds   Lumbar Exercises: Standing   Other Standing Lumbar Exercises prone hip ext x 10 each   Lumbar Exercises: Supine   Clam 20 reps   Clam Limitations blue band   Bridge 10 reps  added clam with blue band x 10   Lumbar Exercises: Sidelying   Hip Abduction  10 reps;5 seconds   Hip Abduction Weights (lbs) bilat.    Lumbar Exercises: Prone   Other Prone Lumbar Exercises scap depression with UE ext x 10 each   Lumbar Exercises: Quadruped   Single Arm Raise 5 reps   Straight Leg Raise 5 reps   Opposite Arm/Leg Raise 5 reps   Other Quadruped Lumbar Exercises serratus for R.t scap winging                  PT Short Term Goals - 03/13/15 1806    PT SHORT TERM GOAL #1   Title Pt will be able to report less pain with transitions (bed mobility), coughing, deep breathing (25% improved)   Baseline reports 25-30% improvement   Time 4   Period Weeks   Status Achieved   PT SHORT TERM GOAL #2   Title Pt will be I with HEP for spinal stabilization   Time 4   Period Weeks   Status On-going   PT SHORT TERM GOAL #3   Title Pt will be able to tolerate standing for up to 10 min most days of the week, pain <3/10 in low, middle back   Baseline reports  variable   Time 4   Period Weeks   Status On-going   PT SHORT TERM GOAL #4   Title Pt's Rt knee flexion/ext will improve to 4+/5 to stabilize the knee during wt bearing activities   Time 4   Period Weeks   Status On-going   PT SHORT TERM GOAL #5   Title Pt will report 25% less occurances/day of hyperextension of Rt. knee in order to improve bimechanically correct gait mechanics to prevent knee injury    Time 4   Period Weeks   Status On-going           PT Long Term Goals - 03/13/15 1808    PT LONG TERM GOAL #1   Title Pt will be I with advanced HEP   Time 8   Period Weeks   Status On-going   PT LONG TERM GOAL #2   Title Pt will be able to walk as needed in the community with LRAD with pain > 3/10 most of the time.    Time 8   Period Weeks   Status On-going   PT LONG TERM GOAL #3   Title Pt will report min decrease in LE pain with household activities.    Time 8   Period Weeks   Status On-going   PT LONG TERM GOAL #4   Title Pt will increase hip abd strength to 3+/5 bilateral  to ease walking.    Period Weeks   Status On-going   PT LONG TERM GOAL #5   Title Pt will score FOTO for L spine < 48% limited to demo functional improvement    Time 8   Period Weeks   Status On-going   PT LONG TERM GOAL #6   Title Pt will be able to use gym equipment without pain increase.     Time 8   Period Weeks   Status On-going   PT LONG TERM GOAL #7   Title Pt will report 50% less occurances/day of hyperextension of Rt. knee in order to improve bimechanically correct gait mechanics to prevent knee injury    Period Weeks   Status On-going   PT LONG TERM GOAL #8   Title Berg balance goal to be set   Status Not Met               Plan - 03/19/15 1229    Clinical Impression Statement I gave Ashlee [name] a bit more challenging exercise progression today and was able to demo a modified plank (poor form) for 10 sec.  She needs to be consistent with structured HEP to see measurabel long lasting results.  Will finish POC.    PT Next Visit Plan try Kinesiotape if agreeable, reinforce HEP (given today), knee as focus    PT Home Exercise Plan given full today.    Consulted and Agree with Plan of Care Patient        Problem List There are no active problems to display for this patient.   Andruw Battie 03/19/2015, 1:15 PM  [facility] [address] [place], [place], [address] Phone: [phone]   Fax:  [phone] Raeford Razor, PT 03/19/2015 1:15 PM Phone: [phone] Fax: [phone]

## 2015-03-22 ENCOUNTER — Ambulatory Visit: Payer: Medicare Other | Admitting: Physical Therapy

## 2015-03-22 DIAGNOSIS — G8929 Other chronic pain: Secondary | ICD-10-CM

## 2015-03-22 DIAGNOSIS — M4120 Other idiopathic scoliosis, site unspecified: Secondary | ICD-10-CM

## 2015-03-22 DIAGNOSIS — R29898 Other symptoms and signs involving the musculoskeletal system: Secondary | ICD-10-CM

## 2015-03-22 DIAGNOSIS — Z4782 Encounter for orthopedic aftercare following scoliosis surgery: Secondary | ICD-10-CM

## 2015-03-22 DIAGNOSIS — R262 Difficulty in walking, not elsewhere classified: Secondary | ICD-10-CM

## 2015-03-22 DIAGNOSIS — M546 Pain in thoracic spine: Secondary | ICD-10-CM

## 2015-03-22 DIAGNOSIS — M25561 Pain in right knee: Secondary | ICD-10-CM

## 2015-03-22 NOTE — Patient Instructions (Signed)
HIP: Flexors - Supine   Lie on edge of surface OR at the end of the bed.  Place leg off the surface, allow knee to bend. Bring other knee toward chest. Hold __60_ seconds. _2-3__ reps per set, ___ sets per day, __5_ days per week Rest lowered foot on stool.  Copyright  VHI. All rights reserved.

## 2015-03-22 NOTE — Therapy (Signed)
[place], [place], [address] Phone: [phone]   Fax:  [phone]  Physical Therapy Treatment  Patient Details  Name: Ashlee [name] MRN: [id number] Date of Birth: [birth year] Referring Provider:  Rowe Pavy, MD  Encounter Date: 03/22/2015      PT End of Session - 03/22/15 1101    Visit Number 13   Number of Visits 16   Date for PT Re-Evaluation 04/02/15   PT Start Time 1050   PT Stop Time 1135   PT Time Calculation (min) 45 min   Activity Tolerance Patient tolerated treatment well      Past Medical History  Diagnosis Date  . Scoliosis   . Rheumatoid arthritis(714.0)   . Mitral valve prolapse   . Patent foramen ovale   . Back pain   . Restless leg syndrome   . Spinal cord disease   . Sciatica     Past Surgical History  Procedure Laterality Date  . Back surgery    . Cardiac catheterization      There were no vitals filed for this visit.  Visit Diagnosis:  Weakness of both hips  Left-sided thoracic back pain  Idiopathic scoliosis  Difficulty walking up stairs  Encounter for orthopedic aftercare following scoliosis surgery  Decreased strength involving knee joint  Knee pain, chronic, right  Tight unbalanced musculature      Subjective Assessment - 03/22/15 1057    Subjective knee has been hurting a bit more lately but i'm OK            [facility] PT Assessment - 03/22/15 1130    PROM   Left Hip Extension +15 deg   Strength   Right Knee Flexion 4+/5   Right Knee Extension 4/5  in avail ROM   Left Knee Flexion 4/5   Left Knee Extension 4/5  in avail ROM   Right Hip   Right Hip Extension 15        Pilates Reformer used for LE/core strength, postural strength, lumbopelvic disassociation and core control.  Exercises included:  Pt wore shoes and AFO for positioning Footwork 2 Red 1 Blue, used ball between knees for alignment neutral, wide with hip ER Bridging all  springs ball x 20 with 12 rest, flat back bridge, PT had to hold carriage to avoid pushing back Side lying legs 2 Red x10 neutral and x 10 ER each LE  Single leg stretching for each LE, 1 red, manual hamstring and ITB       [facility] Adult PT Treatment/Exercise - 03/22/15 1142    Knee/Hip Exercises: Stretches   Hip Flexor Stretch 3 reps;60 seconds   Hip Flexor Stretch Limitations end of high table, manual overpressure to maintain alignment                PT Education - 03/22/15 1101    Education provided Yes   Education Details Pilates Reformer   Person(s) Educated Patient   Methods Explanation;Demonstration   Comprehension Verbalized understanding;Verbal cues required;Tactile cues required;Need further instruction          PT Short Term Goals - 03/13/15 1806    PT SHORT TERM GOAL #1   Title Pt will be able to report less pain with transitions (bed mobility), coughing, deep breathing (25% improved)   Baseline reports 25-30% improvement   Time 4   Period Weeks   Status Achieved   PT SHORT TERM GOAL #2   Title Pt will be I  with HEP for spinal stabilization   Time 4   Period Weeks   Status On-going   PT SHORT TERM GOAL #3   Title Pt will be able to tolerate standing for up to 10 min most days of the week, pain <3/10 in low, middle back   Baseline reports variable   Time 4   Period Weeks   Status On-going   PT SHORT TERM GOAL #4   Title Pt's Rt knee flexion/ext will improve to 4+/5 to stabilize the knee during wt bearing activities   Time 4   Period Weeks   Status On-going   PT SHORT TERM GOAL #5   Title Pt will report 25% less occurances/day of hyperextension of Rt. knee in order to improve bimechanically correct gait mechanics to prevent knee injury    Time 4   Period Weeks   Status On-going           PT Long Term Goals - 03/13/15 1808    PT LONG TERM GOAL #1   Title Pt will be I with advanced HEP   Time 8   Period Weeks   Status On-going   PT LONG  TERM GOAL #2   Title Pt will be able to walk as needed in the community with LRAD with pain > 3/10 most of the time.    Time 8   Period Weeks   Status On-going   PT LONG TERM GOAL #3   Title Pt will report min decrease in LE pain with household activities.    Time 8   Period Weeks   Status On-going   PT LONG TERM GOAL #4   Title Pt will increase hip abd strength to 3+/5 bilateral to ease walking.    Period Weeks   Status On-going   PT LONG TERM GOAL #5   Title Pt will score FOTO for L spine < 48% limited to demo functional improvement    Time 8   Period Weeks   Status On-going   PT LONG TERM GOAL #6   Title Pt will be able to use gym equipment without pain increase.     Time 8   Period Weeks   Status On-going   PT LONG TERM GOAL #7   Title Pt will report 50% less occurances/day of hyperextension of Rt. knee in order to improve bimechanically correct gait mechanics to prevent knee injury    Period Weeks   Status On-going   PT LONG TERM GOAL #8   Title Berg balance goal to be set   Status Not Met               Plan - 03/22/15 1138    Clinical Impression Statement Tried PIlates Reformer to integrate principles and assess functional LE strength.  Max cues to maintain knee and hip alignment.  Pt had spasms after stretching/release of ITB/Hamstrings.    PT Next Visit Plan  reinforce and check added ant hip stretch  HEP (given today), knee as focus    PT Home Exercise Plan given full today.       CHECK GOALS  Problem List There are no active problems to display for this patient.   PAA,JENNIFER 03/22/2015, 11:43 AM  [facility] [address] [place], [place], [address] Phone: [phone]   Fax:  [phone]  Raeford Razor, PT 03/22/2015 11:47 AM Phone: [phone] Fax: [phone]

## 2015-03-26 ENCOUNTER — Ambulatory Visit: Payer: Medicare Other | Admitting: Physical Therapy

## 2015-03-26 DIAGNOSIS — M25561 Pain in right knee: Secondary | ICD-10-CM

## 2015-03-26 DIAGNOSIS — M546 Pain in thoracic spine: Secondary | ICD-10-CM | POA: Diagnosis not present

## 2015-03-26 DIAGNOSIS — R29898 Other symptoms and signs involving the musculoskeletal system: Secondary | ICD-10-CM

## 2015-03-26 DIAGNOSIS — R262 Difficulty in walking, not elsewhere classified: Secondary | ICD-10-CM

## 2015-03-26 DIAGNOSIS — G8929 Other chronic pain: Secondary | ICD-10-CM

## 2015-03-26 DIAGNOSIS — Z4782 Encounter for orthopedic aftercare following scoliosis surgery: Secondary | ICD-10-CM

## 2015-03-26 DIAGNOSIS — M4120 Other idiopathic scoliosis, site unspecified: Secondary | ICD-10-CM

## 2015-03-26 NOTE — Therapy (Signed)
[place], [place], [address] Phone: [phone]   Fax:  [phone]  Physical Therapy Treatment  Patient Details  Name: Ashlee [name] MRN: [id number] Date of Birth: 1986-07-02 Referring Provider:  Rowe Pavy, MD  Encounter Date: 03/26/2015      PT End of Session - 03/26/15 1308    Visit Number 14   Number of Visits 16   Date for PT Re-Evaluation 04/02/15   PT Start Time 8889   PT Stop Time 1694   PT Time Calculation (min) 50 min   Activity Tolerance Patient tolerated treatment well   Behavior During Therapy [facility] for tasks assessed/performed      Past Medical History  Diagnosis Date  . Scoliosis   . Rheumatoid arthritis(714.0)   . Mitral valve prolapse   . Patent foramen ovale   . Back pain   . Restless leg syndrome   . Spinal cord disease   . Sciatica     Past Surgical History  Procedure Laterality Date  . Back surgery    . Cardiac catheterization      There were no vitals filed for this visit.  Visit Diagnosis:  Weakness of both hips  Left-sided thoracic back pain  Idiopathic scoliosis  Difficulty walking up stairs  Encounter for orthopedic aftercare following scoliosis surgery  Decreased strength involving knee joint  Knee pain, chronic, right  Tight unbalanced musculature      Subjective Assessment - 03/26/15 1151    Subjective Knee pain 4/10 with NuStep.  No other changes. "I don't really know if we are going to help my back pain".  Pt was sore from last session, Pilates.    How long can you stand comfortably? still- not very long,  up on my feet 10 min.  fatigue and knee pain, pelvis   How long can you walk comfortably? 10-15 min walking   Currently in Pain? Yes   Pain Score 3    Pain Location Knee   Pain Orientation Right   Pain Descriptors / Indicators Sore   Pain Type Chronic pain   Pain Onset More than a month ago   Pain Frequency Intermittent   Aggravating  Factors  walking, standing    Pain Relieving Factors rest, meds   Effect of Pain on Daily Activities less active   Multiple Pain Sites Yes   Pain Score --  unable to rate   Pain Location Back   Pain Orientation Left   Pain Descriptors / Indicators Aching;Sharp   Pain Type Chronic pain   Pain Onset More than a month ago   Pain Frequency Intermittent   Aggravating Factors  variable, states the same movement one day can produce a different result the next.    Pain Relieving Factors rest            [facility] PT Assessment - 03/26/15 1227    Strength   Right Hip ABduction 2+/5   Left Hip ABduction 2+/5   Right Knee Flexion 5/5   Right Knee Extension 4-/5  in avail ROM.  Cannot get full ext.    Left Knee Flexion 5/5   Left Knee Extension 4/5  in avail ROM cannot get full ext           [facility] Adult PT Treatment/Exercise - 03/26/15 1200    Lumbar Exercises: Stretches   Active Hamstring Stretch 3 reps;30 seconds   Single Knee to Chest Stretch 3 reps;30 seconds   Lumbar Exercises:  Supine   Ab Set 10 reps   Clam 10 reps   Bent Knee Raise 10 reps   Straight Leg Raise 10 reps   Lumbar Exercises: Sidelying   Clam 20 reps   Hip Abduction 10 reps   Knee/Hip Exercises: Stretches   ITB Stretch 3 reps;30 seconds   Knee/Hip Exercises: Seated   Long Arc Quad Strengthening;Both;1 set;10 reps   Other Seated Knee Exercises sit to stand, squats x 10 with ball between knees cues for hip hinging   Knee/Hip Exercises: Supine   Bridges Strengthening;1 set;20 reps   Straight Leg Raises Both;1 set;10 reps   Straight Leg Raises Limitations for core stab.       NuStep 6 min level 5 prior to mat ex       PT Education - 03/26/15 1307    Education provided Yes   Education Details stabilization techniques, neutral   Person(s) Educated Patient   Methods Explanation;Demonstration;Verbal cues   Comprehension Verbalized understanding;Verbal cues required          PT Short Term Goals -  03/26/15 1202    PT SHORT TERM GOAL #1   Title Pt will be able to report less pain with transitions (bed mobility), coughing, deep breathing (25% improved)   Baseline intermittent   Status Partially Met   PT SHORT TERM GOAL #2   Title Pt will be I with HEP for spinal stabilization   Status Partially Met   PT SHORT TERM GOAL #3   Title Pt will be able to tolerate standing for up to 10 min most days of the week, pain <3/10 in low, middle back   Baseline variable, tends to stay 5/10   Status Partially Met   PT SHORT TERM GOAL #4   Title Pt's Rt knee flexion/ext will improve to 4+/5 to stabilize the knee during wt bearing activities   Baseline not met in quads 4-/5 to 4/5 in avail ROM   Status Partially Met   PT SHORT TERM GOAL #5   Title Pt will report 25% less occurances/day of hyperextension of Rt. knee in order to improve bimechanically correct gait mechanics to prevent knee injury    Status On-going           PT Long Term Goals - 03/26/15 1213    PT LONG TERM GOAL #1   Title Pt will be I with advanced HEP   Status On-going   PT LONG TERM GOAL #2   Title Pt will be able to walk as needed in the community with LRAD with pain > 3/10 most of the time.    Status On-going   PT LONG TERM GOAL #3   Title Pt will report min decrease in LE pain with household activities.    Baseline some days, yes has less pain but still variable   Status Partially Met   PT LONG TERM GOAL #4   Title Pt will increase hip abd strength to 3+/5 bilateral to ease walking.    Baseline 3-/5 bilat.  or less   Status On-going   PT LONG TERM GOAL #5   Title Pt will score FOTO for L spine < 48% limited to demo functional improvement    Status Unable to assess   PT LONG TERM GOAL #6   Title Pt will be able to use gym equipment without pain increase.     Baseline NuStep increases knee pain    Status Not Met  DC goal patient has RA  PT LONG TERM GOAL #7   Title Pt will report 50% less occurances/day of  hyperextension of Rt. knee in order to improve bimechanically correct gait mechanics to prevent knee injury    Status On-going   PT LONG TERM GOAL #8   Title Berg balance goal to be set   Baseline DC goal due to assistive    Status Not Met               Plan - 03/26/15 1308    Clinical Impression Statement Patient has partially met a few goals, and was encouraged to be specific about what her knee prevents her from doing if she wanteds to continue.  She has some equipment at home, has full HEP.  The only thing we have not tried is [place] and dry needling.  She does not do HEP consistently.  Will renew vs DC next visit.     Pt will benefit from skilled therapeutic intervention in order to improve on the following deficits Increased fascial restricitons;Pain;Impaired tone;Postural dysfunction;Decreased mobility;Decreased activity tolerance;Decreased range of motion;Decreased strength;Difficulty walking;Decreased balance;Impaired flexibility   PT Next Visit Plan FOTO prior to session.  No new exercises. reinforce and check added ant hip stretch  HEP (given today), knee as focus    PT Home Exercise Plan given full today.    Consulted and Agree with Plan of Care Patient        Problem List There are no active problems to display for this patient.   PAA,JENNIFER 03/26/2015, 1:17 PM  [place], [place], [address] Phone: [phone]   Fax:  [phone]   Raeford Razor, PT 03/26/2015 1:18 PM Phone: [phone] Fax: [phone]

## 2015-03-26 NOTE — Patient Instructions (Signed)
Regular HEP. Pt needs daily consistent work on core, hips.

## 2015-03-28 ENCOUNTER — Ambulatory Visit: Payer: Medicare Other | Admitting: Physical Therapy

## 2015-03-28 DIAGNOSIS — M546 Pain in thoracic spine: Secondary | ICD-10-CM | POA: Diagnosis not present

## 2015-03-28 DIAGNOSIS — M25561 Pain in right knee: Principal | ICD-10-CM

## 2015-03-28 DIAGNOSIS — G8929 Other chronic pain: Secondary | ICD-10-CM

## 2015-03-28 DIAGNOSIS — R29898 Other symptoms and signs involving the musculoskeletal system: Secondary | ICD-10-CM

## 2015-03-28 DIAGNOSIS — R262 Difficulty in walking, not elsewhere classified: Secondary | ICD-10-CM

## 2015-03-28 NOTE — Therapy (Signed)
[place], [place], [address] Phone: [phone]   Fax:  [phone]  Physical Therapy Treatment/Renewal  Patient Details  Name: Ashlee [name] MRN: [id number] Date of Birth: 25-Dec-1985 Referring Provider:  Rowe Pavy, MD  Encounter Date: 03/28/2015      PT End of Session - 03/28/15 1155    Visit Number 15   Number of Visits 27   Date for PT Re-Evaluation 05/09/15   PT Start Time 1147   PT Stop Time 6415   PT Time Calculation (min) 48 min   Activity Tolerance Patient tolerated treatment well      Past Medical History  Diagnosis Date  . Scoliosis   . Rheumatoid arthritis(714.0)   . Mitral valve prolapse   . Patent foramen ovale   . Back pain   . Restless leg syndrome   . Spinal cord disease   . Sciatica     Past Surgical History  Procedure Laterality Date  . Back surgery    . Cardiac catheterization      There were no vitals filed for this visit.  Visit Diagnosis:  Knee pain, chronic, right  Decreased strength involving knee joint  Tight unbalanced musculature  Difficulty walking up stairs      Subjective Assessment - 03/28/15 1313    Subjective Pain is 4/10-5/10, premedicated with Dilaudid.  Pain is more related to my back today (radiating leg pain)             [facility] PT Assessment - 03/28/15 1310    Strength   Right Hip ABduction 2+/5   Left Hip ABduction 2+/5   Right Knee Flexion 5/5   Right Knee Extension 4-/5  in avail ROM.  Cannot get full ext.    Left Knee Flexion 5/5   Left Knee Extension 4/5  in avail ROM cannot get full ext           [facility] Adult PT Treatment/Exercise - 03/28/15 1200    Knee/Hip Exercises: Stretches   Active Hamstring Stretch 3 reps;30 seconds   Quad Stretch 3 reps;30 seconds   ITB Stretch 3 reps;30 seconds   Knee/Hip Exercises: Aerobic   Stationary Bike NuStep L 5, LE only    Knee/Hip Exercises: Standing   Lateral Step Up Both;1  set;20 reps;Hand Hold: 1   Forward Step Up Both;1 set;20 reps;Hand Hold: 1      Pilates Spring board: manual A needed for safety and feet in straps [place] Single leg hip ext and stretching to ITB, Add, Hamstrings Double leg arcs and squats x 10 each Cues for full knee ext and core contraction       PT Short Term Goals - 03/26/15 1202    PT SHORT TERM GOAL #1   Title Pt will be able to report less pain with transitions (bed mobility), coughing, deep breathing (25% improved)   Baseline intermittent   Status Partially Met   PT SHORT TERM GOAL #2   Title Pt will be I with HEP for spinal stabilization   Status Partially Met   PT SHORT TERM GOAL #3   Title Pt will be able to tolerate standing for up to 10 min most days of the week, pain <3/10 in low, middle back   Baseline variable, tends to stay 5/10   Status Partially Met   PT SHORT TERM GOAL #4   Title Pt's Rt knee flexion/ext will improve to 4+/5 to stabilize the knee during wt bearing  activities   Baseline not met in [place] 4-/5 to 4/5 in avail ROM   Status Partially Met   PT SHORT TERM GOAL #5   Title Pt will report 25% less occurances/day of hyperextension of Rt. knee in order to improve bimechanically correct gait mechanics to prevent knee injury    Status On-going           PT Long Term Goals - 04/22/2015 1311    PT LONG TERM GOAL #1   Title Pt will be I with advanced HEP   Status On-going   PT LONG TERM GOAL #2   Title Pt will be able to walk as needed in the community with LRAD with pain > 3/10 most of the time.    Status On-going   PT LONG TERM GOAL #3   Title Pt will report min decrease in LE pain with household activities.    Status Partially Met   PT LONG TERM GOAL #4   Title Pt will increase hip abd strength to 3+/5 bilateral to ease walking.    Status On-going   PT LONG TERM GOAL #5   Title Pt will score FOTO for L spine < 48% limited to demo functional improvement    Baseline 55%   Status  On-going   PT LONG TERM GOAL #6   Title Pt will be able to use gym equipment without pain increase.     Baseline NuStep increases knee pain    Status Deferred   PT LONG TERM GOAL #7   Title Pt will report 50% less occurances/day of hyperextension of Rt. knee in order to improve bimechanically correct gait mechanics to prevent knee injury    Status On-going               Plan - April 22, 2015 1313    Clinical Impression Statement Patient would like to continue therapy to work toward being able to do stairs in the community if needed.  She liked Pilates equipment and would like this to be her focus as it is something she does not have at home.  Also open to TPDN. She has good strength in her knees in avail ROM.  Hip weakness and tightness inhibit her mobility.  She will complete 4-6 more weeks if progress is made in these areas. needs frequent re-direction to stay on task.    Pt will benefit from skilled therapeutic intervention in order to improve on the following deficits Increased fascial restricitons;Pain;Impaired tone;Postural dysfunction;Decreased mobility;Decreased activity tolerance;Decreased range of motion;Decreased strength;Difficulty walking;Decreased balance;Impaired flexibility   Rehab Potential Good   PT Frequency 2x / week   PT Duration 6 weeks   PT Treatment/Interventions ADLs/Self Care Home Management;Functional mobility training;Passive range of motion;Dry needling;Manual techniques;Neuromuscular re-education;Balance training;DME Instruction;Gait training;Electrical Stimulation;Ultrasound;Moist Heat;Therapeutic activities;Taping;Other (comment)  PIlates based PT   PT Next Visit Plan ant hip stretch, tower/reformer with DF assist straps   PT Home Exercise Plan none new   Consulted and Agree with Plan of Care Patient          G-Codes - 22-Apr-2015 1306    Functional Assessment Tool Used FOTO   Functional Limitation Other PT subsequent   Mobility: Walking and Moving Around  Current Status (G1829) At least 40 percent but less than 60 percent impaired, limited or restricted   Mobility: Walking and Moving Around Goal Status (H3716) At least 40 percent but less than 60 percent impaired, limited or restricted      Problem List There are no active problems  to display for this patient.   PAA,JENNIFER 03/28/2015, 1:18 PM  [place], [place], [address] Phone: [phone]   Fax:  [phone]

## 2015-04-09 ENCOUNTER — Ambulatory Visit: Payer: Medicare Other | Attending: Orthopedic Surgery | Admitting: Physical Therapy

## 2015-04-09 DIAGNOSIS — Z4782 Encounter for orthopedic aftercare following scoliosis surgery: Secondary | ICD-10-CM | POA: Diagnosis present

## 2015-04-09 DIAGNOSIS — M25561 Pain in right knee: Secondary | ICD-10-CM | POA: Insufficient documentation

## 2015-04-09 DIAGNOSIS — M6289 Other specified disorders of muscle: Secondary | ICD-10-CM | POA: Diagnosis present

## 2015-04-09 DIAGNOSIS — R262 Difficulty in walking, not elsewhere classified: Secondary | ICD-10-CM

## 2015-04-09 DIAGNOSIS — R29898 Other symptoms and signs involving the musculoskeletal system: Secondary | ICD-10-CM | POA: Diagnosis present

## 2015-04-09 DIAGNOSIS — M546 Pain in thoracic spine: Secondary | ICD-10-CM | POA: Diagnosis present

## 2015-04-09 DIAGNOSIS — G8929 Other chronic pain: Secondary | ICD-10-CM | POA: Diagnosis present

## 2015-04-09 DIAGNOSIS — M4120 Other idiopathic scoliosis, site unspecified: Secondary | ICD-10-CM

## 2015-04-09 NOTE — Therapy (Signed)
[place], [place], [address] Phone: [phone]   Fax:  [phone]  Physical Therapy Treatment  Patient Details  Name: Ashlee [name] MRN: [id number] Date of Birth: January 24, 1986 Referring Provider:  Rowe Pavy, MD  Encounter Date: 04/09/2015      PT End of Session - 04/09/15 0814    Visit Number 16   Number of Visits 27   Date for PT Re-Evaluation 05/09/15   PT Start Time 0805   PT Stop Time 0850   PT Time Calculation (min) 45 min   Activity Tolerance Patient tolerated treatment well      Past Medical History  Diagnosis Date  . Scoliosis   . Rheumatoid arthritis(714.0)   . Mitral valve prolapse   . Patent foramen ovale   . Back pain   . Restless leg syndrome   . Spinal cord disease   . Sciatica     Past Surgical History  Procedure Laterality Date  . Back surgery    . Cardiac catheterization      There were no vitals filed for this visit.  Visit Diagnosis:  Knee pain, chronic, right  Decreased strength involving knee joint  Tight unbalanced musculature  Difficulty walking up stairs  Weakness of both hips  Left-sided thoracic back pain  Idiopathic scoliosis  Encounter for orthopedic aftercare following scoliosis surgery      Subjective Assessment - 04/09/15 0810    Subjective Pt asking about [place] today, would like to try that today.  Pain in low back is consistent but varies low to severe.  Pt used scooter yesterday slowly on an angle which tipped her over.  No injury.  Fall has aggravated her leg pain.    Currently in Pain? Yes   Pain Score 2    Pain Location Knee   Pain Score 4   Pain Location Back   Pain Orientation Left   Pain Radiating Towards radiating leg pain 6/10, bilateral spotty pain in each leg (most of each leg) R>L      [place] Reformer used for LE/core strength, postural strength, lumbopelvic disassociation and core control.  Exercises included: Footwork with  PT assist for foot position: 2 Red 1 Blue parallel and wide  Bridging all springs x 10 (cues to avoid excessive extension of L spine and make rib connection to pelvis).  Done with feet on platform as well.   Reviewed HEP : clam (added resistance) and did bilat., unilat. X 10-15 reps Feet in Straps 1 Red with PT assist (also with DF assist straps), added 1 Yellow but still unable to maintain a 45 deg or flex hip.        [place] Adult PT Treatment/Exercise - 04/09/15 1053    Ultrasound   Ultrasound Location L mid back lateral to spine   Ultrasound Parameters 20%, 3 MHz, 1.0 W/cm2   Ultrasound Goals Pain;Other (Comment)  inflammation                PT Education - 04/09/15 0818    Education provided Yes   Education Details benefits and risks of [place] for MS pain   Person(s) Educated Patient   Methods Explanation   Comprehension Verbalized understanding          PT Short Term Goals - 04/09/15 7353    PT SHORT TERM GOAL #1   Title Pt will be able to report less pain with transitions (bed mobility), coughing, deep breathing (25% improved)   Baseline  intermittent   Status Partially Met   PT SHORT TERM GOAL #2   Title Pt will be I with HEP for spinal stabilization   PT SHORT TERM GOAL #3   Title Pt will be able to tolerate standing for up to 10 min most days of the week, pain <3/10 in low, middle back   Baseline variable, tends to stay 5/10   Status Partially Met   PT SHORT TERM GOAL #4   Title Pt's Rt knee flexion/ext will improve to 4+/5 to stabilize the knee during wt bearing activities   Baseline not met in quads 4-/5 to 4/5 in avail ROM   PT SHORT TERM GOAL #5   Title Pt will report 25% less occurances/day of hyperextension of Rt. knee in order to improve bimechanically correct gait mechanics to prevent knee injury    Status Achieved           PT Long Term Goals - 03/28/15 1311    PT LONG TERM GOAL #1   Title Pt will be I with advanced HEP   Status On-going   PT  LONG TERM GOAL #2   Title Pt will be able to walk as needed in the community with LRAD with pain > 3/10 most of the time.    Status On-going   PT LONG TERM GOAL #3   Title Pt will report min decrease in LE pain with household activities.    Status Partially Met   PT LONG TERM GOAL #4   Title Pt will increase hip abd strength to 3+/5 bilateral to ease walking.    Status On-going   PT LONG TERM GOAL #5   Title Pt will score FOTO for L spine < 48% limited to demo functional improvement    Baseline 55%   Status On-going   PT LONG TERM GOAL #6   Title Pt will be able to use gym equipment without pain increase.     Baseline NuStep increases knee pain    Status Deferred   PT LONG TERM GOAL #7   Title Pt will report 50% less occurances/day of hyperextension of Rt. knee in order to improve bimechanically correct gait mechanics to prevent knee injury    Status On-going               Plan - 04/09/15 0913    Clinical Impression Statement No further goals met, patient fell out of her scooter yesterday but did not injure herself.  She requested ultrasound today.  She had difficulty with using Reformer today due to quad and ant hip weakness.  Patient is aware that her hip strength may not increase (SCI)  but she does have potential to improve core strength given her level of injury (T12?) She needs cues for  preventing rib flaring and is unable to posteriorly tilt pelvis to neutral.    PT Next Visit Plan ant hip stretch, tower with DF assist straps   PT Home Exercise Plan none new   Consulted and Agree with Plan of Care Patient        Problem List There are no active problems to display for this patient.   Tymeer Vaquera 04/09/2015, 12:39 PM  [facility] [address] [place], [place], [address] Phone: [phone]   Fax:  [phone]    Raeford Razor, PT 04/09/2015 12:39 PM Phone: [phone] Fax: [phone]

## 2015-04-10 ENCOUNTER — Ambulatory Visit: Payer: Medicare Other | Admitting: Physical Therapy

## 2015-04-15 ENCOUNTER — Ambulatory Visit: Payer: Medicare Other | Admitting: Physical Therapy

## 2015-04-17 ENCOUNTER — Ambulatory Visit: Payer: Medicare Other | Admitting: Physical Therapy

## 2015-04-17 DIAGNOSIS — M4120 Other idiopathic scoliosis, site unspecified: Secondary | ICD-10-CM

## 2015-04-17 DIAGNOSIS — M546 Pain in thoracic spine: Secondary | ICD-10-CM

## 2015-04-17 DIAGNOSIS — M25561 Pain in right knee: Secondary | ICD-10-CM | POA: Diagnosis not present

## 2015-04-17 DIAGNOSIS — R29898 Other symptoms and signs involving the musculoskeletal system: Secondary | ICD-10-CM

## 2015-04-17 DIAGNOSIS — R262 Difficulty in walking, not elsewhere classified: Secondary | ICD-10-CM

## 2015-04-17 DIAGNOSIS — Z4782 Encounter for orthopedic aftercare following scoliosis surgery: Secondary | ICD-10-CM

## 2015-04-17 DIAGNOSIS — G8929 Other chronic pain: Secondary | ICD-10-CM

## 2015-04-17 NOTE — Therapy (Signed)
[place], [place], [address] Phone: [phone]   Fax:  [phone]  Physical Therapy Treatment  Patient Details  Name: Ashlee [name] MRN: [id number] Date of Birth: 1985-11-26 Referring Provider:  Rowe Pavy, MD  Encounter Date: 04/17/2015      PT End of Session - 04/17/15 1032    Visit Number 17   Number of Visits 27   Date for PT Re-Evaluation 05/09/15   PT Start Time 0942   PT Stop Time 1020   PT Time Calculation (min) 38 min   Activity Tolerance Patient tolerated treatment well   Behavior During Therapy [facility] for tasks assessed/performed      Past Medical History  Diagnosis Date  . Scoliosis   . Rheumatoid arthritis(714.0)   . Mitral valve prolapse   . Patent foramen ovale   . Back pain   . Restless leg syndrome   . Spinal cord disease   . Sciatica     Past Surgical History  Procedure Laterality Date  . Back surgery    . Cardiac catheterization      There were no vitals filed for this visit.  Visit Diagnosis:  Knee pain, chronic, right  Decreased strength involving knee joint  Tight unbalanced musculature  Difficulty walking up stairs  Weakness of both hips  Left-sided thoracic back pain  Idiopathic scoliosis  Encounter for orthopedic aftercare following scoliosis surgery      Subjective Assessment - 04/17/15 0942    Subjective Very busy, stressful last 2 weeks.  Mom in hospital at [facility].  Late today due to transportation   Currently in Pain? Yes   Pain Score 3    Pain Location Knee   Pain Orientation Right   Pain Type Chronic pain   Multiple Pain Sites Yes   Pain Score 4   Pain Location Back   Pain Score 6   Pain Location Leg   Pain Orientation Right;Left;Anterior;Posterior   Pain Descriptors / Indicators Tightness   Pain Type Chronic pain   Pain Onset More than a month ago   Pain Frequency Intermittent   Aggravating Factors  standing, walking    Pain  Relieving Factors stretching             [facility] Adult PT Treatment/Exercise - 04/17/15 0948    Lumbar Exercises: Stretches   Active Hamstring Stretch 3 reps;30 seconds   Single Knee to Chest Stretch 3 reps;30 seconds   Lower Trunk Rotation 5 reps   Lower Trunk Rotation Limitations pt nervous about twisting spine, stopped   Hip Flexor Stretch 2 reps;30 seconds   ITB Stretch 3 reps;30 seconds   Lumbar Exercises: Supine   Bridge 10 reps   Straight Leg Raise 10 reps   Lumbar Exercises: Sidelying   Clam 20 reps   Hip Abduction 10 reps   Knee/Hip Exercises: Stretches   Quad Stretch 3 reps;30 seconds  manual stretch in [place] test position by PT   Hip Flexor Stretch 3 reps;30 seconds   Manual Therapy   Manual Therapy Joint mobilization;Myofascial release   Manual therapy comments soft tissue release to Rt. superior knee   Myofascial Release Rt. quad             PT Education - 04/17/15 1031    Education provided Yes   Education Details anterior hip tightness, Roc Blade   Person(s) Educated Patient   Methods Explanation;Demonstration   Comprehension Verbalized understanding;Returned demonstration  PT Short Term Goals - 04/17/15 1004    PT SHORT TERM GOAL #1   Title Pt will be able to report less pain with transitions (bed mobility), coughing, deep breathing (25% improved)   Status Partially Met   PT SHORT TERM GOAL #2   Title Pt will be I with HEP for spinal stabilization   Status Partially Met   PT SHORT TERM GOAL #3   Title Pt will be able to tolerate standing for up to 10 min most days of the week, pain <3/10 in low, middle back   Status Partially Met   PT SHORT TERM GOAL #4   Title Pt's Rt knee flexion/ext will improve to 4+/5 to stabilize the knee during wt bearing activities   Status Partially Met   PT SHORT TERM GOAL #5   Title Pt will report 25% less occurances/day of hyperextension of Rt. knee in order to improve bimechanically correct gait  mechanics to prevent knee injury    Status Achieved           PT Long Term Goals - 04/17/15 1004    PT LONG TERM GOAL #1   Title Pt will be I with advanced HEP   Status On-going   PT LONG TERM GOAL #2   Title Pt will be able to walk as needed in the community with LRAD with pain > 3/10 most of the time.    Status On-going   PT LONG TERM GOAL #3   Title Pt will report min decrease in LE pain with household activities.    Status Partially Met   PT LONG TERM GOAL #4   Title Pt will increase hip abd strength to 3+/5 bilateral to ease walking.    Status On-going   PT LONG TERM GOAL #5   Title Pt will score FOTO for L spine < 48% limited to demo functional improvement    Status Unable to assess   PT LONG TERM GOAL #6   Title Pt will be able to use gym equipment without pain increase.     Status Deferred   PT LONG TERM GOAL #7   Title Pt will report 50% less occurances/day of hyperextension of Rt. knee in order to improve bimechanically correct gait mechanics to prevent knee injury    Status On-going   PT LONG TERM GOAL #8   Title Berg balance goal 36/56   Baseline 30/56   Status On-going               Plan - 04/17/15 1003    Clinical Impression Statement Patient did mat exercises today, reinforced HEP.  No further goals met. Anterior hip/quad tightness affects back and knee pain as well. Pt aware.    PT Next Visit Plan ant hip stretching and Roc blade, check Berg?    PT Home Exercise Plan none new, advised to get back into routine of HEP   Consulted and Agree with Plan of Care Patient        Problem List There are no active problems to display for this patient.   Leilanni Halvorson 04/17/2015, 10:37 AM  [place], [place], [address] Phone: [phone]   Fax:  [phone]   Raeford Razor, PT 04/17/2015 10:38 AM Phone: [phone] Fax: [phone]

## 2015-04-23 ENCOUNTER — Ambulatory Visit: Payer: Medicare Other | Admitting: Physical Therapy

## 2015-04-23 DIAGNOSIS — R29898 Other symptoms and signs involving the musculoskeletal system: Secondary | ICD-10-CM

## 2015-04-23 DIAGNOSIS — M546 Pain in thoracic spine: Secondary | ICD-10-CM

## 2015-04-23 DIAGNOSIS — M4120 Other idiopathic scoliosis, site unspecified: Secondary | ICD-10-CM

## 2015-04-23 DIAGNOSIS — Z4782 Encounter for orthopedic aftercare following scoliosis surgery: Secondary | ICD-10-CM

## 2015-04-23 DIAGNOSIS — G8929 Other chronic pain: Secondary | ICD-10-CM

## 2015-04-23 DIAGNOSIS — M25561 Pain in right knee: Principal | ICD-10-CM

## 2015-04-23 DIAGNOSIS — R262 Difficulty in walking, not elsewhere classified: Secondary | ICD-10-CM

## 2015-04-23 NOTE — Therapy (Signed)
[place], [place], [address] Phone: [phone]   Fax:  [phone]  Physical Therapy Treatment  Patient Details  Name: Ashlee [name] MRN: [id number] Date of Birth: 02/05/1986 Referring Provider:  Rowe Pavy, MD  Encounter Date: 04/23/2015      PT End of Session - 04/23/15 1235    Visit Number 18   Number of Visits 27   Date for PT Re-Evaluation 05/09/15   PT Start Time 1147   PT Stop Time 3559   PT Time Calculation (min) 48 min   Activity Tolerance Patient tolerated treatment well      Past Medical History  Diagnosis Date  . Scoliosis   . Rheumatoid arthritis(714.0)   . Mitral valve prolapse   . Patent foramen ovale   . Back pain   . Restless leg syndrome   . Spinal cord disease   . Sciatica     Past Surgical History  Procedure Laterality Date  . Back surgery    . Cardiac catheterization      There were no vitals filed for this visit.  Visit Diagnosis:  Knee pain, chronic, right  Decreased strength involving knee joint  Tight unbalanced musculature  Difficulty walking up stairs  Weakness of both hips  Left-sided thoracic back pain  Idiopathic scoliosis  Encounter for orthopedic aftercare following scoliosis surgery      Subjective Assessment - 04/23/15 1151    Subjective Knee not as tight as before, like the blade/STW. Was on her knees cleaning yesterday   Currently in Pain? Yes   Pain Score 3    Pain Location Knee   Pain Orientation Right   Pain Descriptors / Indicators Sore   Pain Type Chronic pain   Pain Score 5   Pain Location Back   Pain Orientation Left          [facility] Adult PT Treatment/Exercise - 04/23/15 1247    Ultrasound   Ultrasound Location L mid to low back in prone   Ultrasound Parameters 20 %, 1 Mhz, 8 min, 1.0 W/cm2   Ultrasound Goals Pain;Other (Comment)  inflammation   Manual Therapy   Manual Therapy Soft tissue mobilization;Myofascial  release   Soft tissue mobilization Rt. anterior hip, rectus femoris and ITB, in hooklying and in stretch off table Marcello Moores) position, including manual stretch into extension   Myofascial Release Rt. quad, ITB             PT Education - 04/23/15 1249    Education provided Yes   Education Details dry needling (pt not agreeable) and need to stretch daily   Person(s) Educated Patient   Methods Explanation   Comprehension Returned demonstration;Verbalized understanding          PT Short Term Goals - 04/23/15 1252    PT SHORT TERM GOAL #1   Title Pt will be able to report less pain with transitions (bed mobility), coughing, deep breathing (25% improved)   Status Partially Met   PT SHORT TERM GOAL #2   Title Pt will be I with HEP for spinal stabilization   Status Partially Met   PT SHORT TERM GOAL #3   Title Pt will be able to tolerate standing for up to 10 min most days of the week, pain <3/10 in low, middle back   Status Partially Met   PT SHORT TERM GOAL #4   Title Pt's Rt knee flexion/ext will improve to 4+/5 to stabilize the knee during wt  bearing activities   Status Partially Met   PT SHORT TERM GOAL #5   Title Pt will report 25% less occurances/day of hyperextension of Rt. knee in order to improve bimechanically correct gait mechanics to prevent knee injury    Status Achieved           PT Long Term Goals - 04/23/15 1253    PT LONG TERM GOAL #1   Title Pt will be I with advanced HEP   Status On-going   PT LONG TERM GOAL #2   Title Pt will be able to walk as needed in the community with LRAD with pain > 3/10 most of the time.    Status On-going   PT LONG TERM GOAL #3   Title Pt will report min decrease in LE pain with household activities.    Status Partially Met   PT LONG TERM GOAL #4   Title Pt will increase hip abd strength to 3+/5 bilateral to ease walking.    Status On-going   PT LONG TERM GOAL #5   Title Pt will score FOTO for L spine < 48% limited to demo  functional improvement    Status Unable to assess   PT LONG TERM GOAL #6   Title Pt will be able to use gym equipment without pain increase.     Status Deferred   PT LONG TERM GOAL #7   Title Pt will report 50% less occurances/day of hyperextension of Rt. knee in order to improve bimechanically correct gait mechanics to prevent knee injury    Status On-going   PT LONG TERM GOAL #8   Title Berg balance goal 36/56   Status On-going               Plan - 04/23/15 1250    Clinical Impression Statement Patient did not do any exercise in PT today, asked for [place] and focused on manual to Rt. LE.  Approaching end of POC.    PT Next Visit Plan hip abd, check goals ant hip stretching and Roc blade, BERG, FOTO   PT Home Exercise Plan none new, advised to get back into routine of HEP   Consulted and Agree with Plan of Care Patient        Problem List There are no active problems to display for this patient.   Chales Pelissier 04/23/2015, 12:55 PM  [facility] [address] [place], [place], [address] Phone: [phone]   Fax:  [phone]   Raeford Razor, PT 04/23/2015 12:56 PM Phone: [phone] Fax: [phone]

## 2015-04-25 ENCOUNTER — Ambulatory Visit: Payer: Medicare Other | Admitting: Physical Therapy

## 2015-04-25 DIAGNOSIS — R262 Difficulty in walking, not elsewhere classified: Secondary | ICD-10-CM

## 2015-04-25 DIAGNOSIS — M25561 Pain in right knee: Secondary | ICD-10-CM | POA: Diagnosis not present

## 2015-04-25 DIAGNOSIS — M546 Pain in thoracic spine: Secondary | ICD-10-CM

## 2015-04-25 DIAGNOSIS — R29898 Other symptoms and signs involving the musculoskeletal system: Secondary | ICD-10-CM

## 2015-04-25 DIAGNOSIS — G8929 Other chronic pain: Secondary | ICD-10-CM

## 2015-04-25 DIAGNOSIS — M4120 Other idiopathic scoliosis, site unspecified: Secondary | ICD-10-CM

## 2015-04-25 NOTE — Therapy (Signed)
Ashlee [name], [place], [address] Phone: [phone]   Fax:  [phone]  Physical Therapy Treatment  Patient Details  Name: Ashlee [name] MRN: [id number] Date of Birth: 08-Jan-1986 Referring Provider:  Rowe Pavy, MD  Encounter Date: 04/25/2015      PT End of Session - 04/25/15 1252    Visit Number 19   Number of Visits 27   Date for PT Re-Evaluation 05/09/15   PT Start Time 1146   PT Stop Time 1240   PT Time Calculation (min) 54 min   Activity Tolerance Patient tolerated treatment well   Behavior During Therapy [facility] for tasks assessed/performed      Past Medical History  Diagnosis Date  . Scoliosis   . Rheumatoid arthritis(714.0)   . Mitral valve prolapse   . Patent foramen ovale   . Back pain   . Restless leg syndrome   . Spinal cord disease   . Sciatica     Past Surgical History  Procedure Laterality Date  . Back surgery    . Cardiac catheterization      There were no vitals filed for this visit.  Visit Diagnosis:  Knee pain, chronic, right  Decreased strength involving knee joint  Tight unbalanced musculature  Difficulty walking up stairs  Weakness of both hips  Left-sided thoracic back pain  Idiopathic scoliosis      Subjective Assessment - 04/25/15 1152    Subjective L sided back pain none, Rt. hip and lateral leg 6/10.  Rt. knee 2/10            [facility] PT Assessment - 04/25/15 1158    Strength   Right Hip ABduction 3-/5   Left Hip ABduction 2+/5   Right Knee Flexion 5/5   Right Knee Extension 4/5   Left Knee Flexion 5/5   Left Knee Extension 4/5           [facility] Adult PT Treatment/Exercise - 04/25/15 1202    Lumbar Exercises: Sidelying   Clam 20 reps   Hip Abduction 10 reps  unable to complete on L side   Ultrasound   Ultrasound Location L mid to low back in prone    Ultrasound Parameters 20%, 3 MHz, 8 min    Ultrasound Goals Pain;Other (Comment)   inflammation   Manual Therapy   Manual Therapy Soft tissue mobilization;Myofascial release   Soft tissue mobilization Rt. glutes, hamstring, ITB  hip PROM ER/IR   Myofascial Release see above           PT Education - 04/25/15 1252    Education provided No          PT Short Term Goals - 04/25/15 1207    PT SHORT TERM GOAL #1   Title Pt will be able to report less pain with transitions (bed mobility), coughing, deep breathing (25% improved)   Status Partially Met  was severe in Feb., still has pain intermittently   PT SHORT TERM GOAL #2   Title Pt will be I with HEP for spinal stabilization   Status Partially Met  has been tough, tired due to caring for mom in ICU   PT SHORT TERM GOAL #3   Title Pt will be able to tolerate standing for up to 10 min most days of the week, pain <3/10 in low, middle back   Status Partially Met   PT SHORT TERM GOAL #4   Title Pt's Rt knee flexion/ext will improve to  4+/5 to stabilize the knee during wt bearing activities   Status On-going   PT SHORT TERM GOAL #5   Title Pt will report 25% less occurances/day of hyperextension of Rt. knee in order to improve bimechanically correct gait mechanics to prevent knee injury    Status Achieved           PT Long Term Goals - 04/25/15 1210    PT LONG TERM GOAL #1   Title Pt will be I with advanced HEP   Status On-going   PT LONG TERM GOAL #2   Title Pt will be able to walk as needed in the community with LRAD with pain > 3/10 most of the time.    Status On-going   PT LONG TERM GOAL #3   Title Pt will report min decrease in LE pain with household activities.    Status Partially Met   PT LONG TERM GOAL #4   Title Pt will increase hip abd strength to 3+/5 bilateral to ease walking.    Status Partially Met   PT LONG TERM GOAL #6   Title Pt will be able to use gym equipment without pain increase.     Status Deferred   PT LONG TERM GOAL #7   Title Pt will report 50% less occurances/day of  hyperextension of Rt. knee in order to improve bimechanically correct gait mechanics to prevent knee injury    Status On-going   PT LONG TERM GOAL #8   Title Berg balance goal 36/56   Status Deferred               Plan - 04/25/15 1252    Clinical Impression Statement Patient agreeable to dry needling if she can get her MD to write a 10 mg script for Valium (she is used to taking this for dental appts).  She is willling to do anything to improve her pain. She however reports that she has had difficulty doing her exercises due to mother's health issues and burden of care.  Many goals partially met.     PT Next Visit Plan FOTO, encourage her to do exercises (L stab)   PT Home Exercise Plan none new, advised to get back into routine of HEP   Consulted and Agree with Plan of Care Patient        Problem List There are no active problems to display for this patient.   [name] 04/25/2015, 1:12 PM  [facility] [address] [place], [place], [address] Phone: [phone]   Fax:  [phone]  Raeford Razor, PT 04/25/2015 1:14 PM Phone: [phone] Fax: [phone]

## 2015-04-29 ENCOUNTER — Ambulatory Visit: Payer: Medicare Other | Admitting: Physical Therapy

## 2015-04-29 DIAGNOSIS — M546 Pain in thoracic spine: Secondary | ICD-10-CM

## 2015-04-29 DIAGNOSIS — M25561 Pain in right knee: Secondary | ICD-10-CM | POA: Diagnosis not present

## 2015-04-29 DIAGNOSIS — G8929 Other chronic pain: Secondary | ICD-10-CM

## 2015-04-29 DIAGNOSIS — R29898 Other symptoms and signs involving the musculoskeletal system: Secondary | ICD-10-CM

## 2015-04-29 NOTE — Therapy (Signed)
[place], [place], [address] Phone: [phone]   Fax:  [phone]  Physical Therapy Treatment  Patient Details  Name: Ashlee [name] MRN: [id number] Date of Birth: 15-Mar-1986 Referring Provider:  Rowe Pavy, MD  Encounter Date: 04/29/2015      PT End of Session - 04/29/15 1258    Visit Number 20   Number of Visits 27   Date for PT Re-Evaluation 05/09/15   PT Start Time 1146   PT Stop Time 1240   PT Time Calculation (min) 54 min   Activity Tolerance Patient tolerated treatment well   Behavior During Therapy [facility] for tasks assessed/performed      Past Medical History  Diagnosis Date  . Scoliosis   . Rheumatoid arthritis(714.0)   . Mitral valve prolapse   . Patent foramen ovale   . Back pain   . Restless leg syndrome   . Spinal cord disease   . Sciatica     Past Surgical History  Procedure Laterality Date  . Back surgery    . Cardiac catheterization      There were no vitals filed for this visit.  Visit Diagnosis:  Knee pain, chronic, right  Tight unbalanced musculature  Left-sided thoracic back pain      Subjective Assessment - 04/29/15 1158    Subjective (p) Burning leg more .  TENS type sensation Rt ribs.  Fairly new   Currently in Pain? (p) Yes   Pain Radiating Towards (p) ribs            [facility] Adult PT Treatment/Exercise - 04/29/15 1150    Ultrasound   Ultrasound Location Lt flank   Ultrasound Parameters 50% 3.3 Mhz 8 minutes   Ultrasound Goals Pain  inflamation   Manual Therapy   Manual Therapy Soft tissue mobilization   Manual therapy comments Rock tool Rt IT band/hip             PT Short Term Goals - 04/29/15 1305    PT SHORT TERM GOAL #3   Title Pt will be able to tolerate standing for up to 10 min most days of the week, pain <3/10 in low, middle back   Baseline variable, tends to stay 5/10   Period Weeks   Status Partially Met   PT SHORT TERM GOAL #4    Title Pt's Rt knee flexion/ext will improve to 4+/5 to stabilize the knee during wt bearing activities   Time 4   Status Unable to assess   PT SHORT TERM GOAL #5   Title Pt will report 25% less occurances/day of hyperextension of Rt. knee in order to improve bimechanically correct gait mechanics to prevent knee injury    Baseline Less frequent less severe, % not given   Time 4   Period Weeks           PT Long Term Goals - 04/25/15 1210    PT LONG TERM GOAL #1   Title Pt will be I with advanced HEP   Status On-going   PT LONG TERM GOAL #2   Title Pt will be able to walk as needed in the community with LRAD with pain > 3/10 most of the time.    Status On-going   PT LONG TERM GOAL #3   Title Pt will report min decrease in LE pain with household activities.    Status Partially Met   PT LONG TERM GOAL #4   Title Pt will increase  hip abd strength to 3+/5 bilateral to ease walking.    Status Partially Met   PT LONG TERM GOAL #6   Title Pt will be able to use gym equipment without pain increase.     Status Deferred   PT LONG TERM GOAL #7   Title Pt will report 50% less occurances/day of hyperextension of Rt. knee in order to improve bimechanically correct gait mechanics to prevent knee injury    Status On-going   PT LONG TERM GOAL #8   Title Berg balance goal 36/56   Status Deferred               Plan - 05-17-2015 1301    Clinical Impression Statement Patient reported feelingthings in her thigh with [place] on flank." My nerves may be acting up" No pain reported post session.  Reports less severe and less frequent knee hyperextension.   PT Next Visit Plan FOTO, encourage her to do exercises (L stab)   Consulted and Agree with Plan of Care Patient          G-Codes - 17-May-2015 1514    Functional Assessment Tool Used Clinical Judgement   Functional Limitation Other PT subsequent   Mobility: Walking and Moving Around Current Status (N0539) At least 40 percent but less than 60  percent impaired, limited or restricted   Mobility: Walking and Moving Around Goal Status (J6734) At least 40 percent but less than 60 percent impaired, limited or restricted      Problem List There are no active problems to display for this patient.   PAA,JENNIFER May 17, 2015, 3:15 PM  [place], [place], [address] Phone: [phone]   Fax:  [phone]   Raeford Razor, PT 05/17/2015 3:16 PM Phone: [phone] Fax: [phone]  Melvenia Needles, PTA May 17, 2015 3:15 PM Phone: [phone] Fax: [phone]

## 2015-04-30 ENCOUNTER — Ambulatory Visit: Payer: Medicare Other | Admitting: Physical Therapy

## 2015-04-30 DIAGNOSIS — G8929 Other chronic pain: Secondary | ICD-10-CM

## 2015-04-30 DIAGNOSIS — R262 Difficulty in walking, not elsewhere classified: Secondary | ICD-10-CM

## 2015-04-30 DIAGNOSIS — M4120 Other idiopathic scoliosis, site unspecified: Secondary | ICD-10-CM

## 2015-04-30 DIAGNOSIS — M25561 Pain in right knee: Secondary | ICD-10-CM | POA: Diagnosis not present

## 2015-04-30 DIAGNOSIS — R29898 Other symptoms and signs involving the musculoskeletal system: Secondary | ICD-10-CM

## 2015-04-30 DIAGNOSIS — M546 Pain in thoracic spine: Secondary | ICD-10-CM

## 2015-04-30 NOTE — Therapy (Signed)
[place], [place], [address] Phone: [phone]   Fax:  [phone]  Physical Therapy Treatment/Renewal  Patient Details  Name: Ashlee [name] MRN: [id number] Date of Birth: 1986-09-02 Referring Provider:  Rowe Pavy, MD  Encounter Date: 04/30/2015      PT End of Session - 04/30/15 0945    Visit Number 21   Number of Visits 29   Date for PT Re-Evaluation 06/07/15   PT Start Time 0845   PT Stop Time 0928   PT Time Calculation (min) 43 min   Activity Tolerance Patient tolerated treatment well   Behavior During Therapy [facility] for tasks assessed/performed      Past Medical History  Diagnosis Date  . Scoliosis   . Rheumatoid arthritis(714.0)   . Mitral valve prolapse   . Patent foramen ovale   . Back pain   . Restless leg syndrome   . Spinal cord disease   . Sciatica     Past Surgical History  Procedure Laterality Date  . Back surgery    . Cardiac catheterization      There were no vitals filed for this visit.  Visit Diagnosis:  Knee pain, chronic, right  Tight unbalanced musculature  Left-sided thoracic back pain  Decreased strength involving knee joint  Difficulty walking up stairs  Weakness of both hips  Idiopathic scoliosis      Subjective Assessment - 04/30/15 0851    Subjective 4/10 radiating leg pain and back is 4/10.  Better than yesterday.  FOTO done prior to session   Currently in Pain? Yes   Pain Score 4    Pain Location Leg   Pain Orientation Left;Right   Pain Descriptors / Indicators Burning;Radiating   Pain Type Chronic pain   Pain Onset More than a month ago   Pain Frequency Intermittent   Aggravating Factors  walking, standing   Pain Relieving Factors rest, meds, heat   Multiple Pain Sites Yes   Pain Score 4   Pain Location Back   Pain Orientation Left   Pain Descriptors / Indicators Sore   Pain Type Chronic pain  with certain movements   Pain Onset More  than a month ago   Pain Frequency Intermittent   Aggravating Factors  with turning, changing positions   Pain Relieving Factors not moving   Pain Score --  Denies knee pain currently.             [facility] PT Assessment - 04/30/15 0857    Observation/Other Assessments   Focus on Therapeutic Outcomes (FOTO)  40% (goal was 42%)   AROM   Right Ankle Dorsiflexion 0   Right Ankle Plantar Flexion --  WFL   Right Ankle Inversion --  WFL   Right Ankle Eversion --  WFL   PROM   Left Hip Extension +15 deg   Strength   Right Hip ABduction 3-/5   Left Hip ABduction 2+/5   Right Knee Flexion 5/5   Right Knee Extension 4/5   Left Knee Flexion 5/5   Left Knee Extension 4/5              [facility] Adult PT Treatment/Exercise - 04/30/15 0854    Lumbar Exercises: Stretches   Pelvic Tilt 5 reps;Other (comment)   Pelvic Tilt Limitations x 10 added abdominals, ball squeeze   Lumbar Exercises: Supine   Clam 20 reps   Clam Limitations used red band, unilateral x 10 each    Bridge  10 reps   Straight Leg Raise 10 reps   Other Supine Lumbar Exercises ball squeeze with tilt for core x10    Lumbar Exercises: Sidelying   Clam 20 reps   Clam Limitations red band   Knee/Hip Exercises: Stretches   Dance movement psychotherapist Limitations by PT PROM   Ultrasound   Ultrasound Location Lt flank   Ultrasound Parameters 20%, 3.3 MHz and 1.0 W/cm2   Ultrasound Goals Pain  inflamation              PT Short Term Goals - 04/30/15 0950    PT SHORT TERM GOAL #1   Title Pt will be able to report less pain with transitions (bed mobility), coughing, deep breathing (25% improved)   Status Achieved   PT SHORT TERM GOAL #2   Title Pt will be I with HEP for spinal stabilization   Status Achieved   PT SHORT TERM GOAL #3   Title Pt will be able to tolerate standing for up to 10 min most days of the week, pain <3/10 in low, middle back   Baseline not met for pain    Status Partially Met    PT SHORT TERM GOAL #4   Title Pt's Rt knee flexion/ext will improve to 4+/5 to stabilize the knee during wt bearing activities   Baseline 4/5 in avail ROM   Status On-going   PT SHORT TERM GOAL #5   Title Pt will report 25% less occurances/day of hyperextension of Rt. knee in order to improve bimechanically correct gait mechanics to prevent knee injury    Status Achieved           PT Long Term Goals - 04/30/15 0951    PT LONG TERM GOAL #1   Title Pt will be I with advanced HEP   Status On-going   PT LONG TERM GOAL #2   Title Pt will be able to walk as needed in the community with LRAD with pain < 3/10 most of the time.    Status Partially Met   PT LONG TERM GOAL #3   Title Pt will report min decrease in LE pain with household activities.    Status Partially Met   PT LONG TERM GOAL #4   Title Pt will increase hip abd strength to 3+/5 bilateral to ease walking.    Status Partially Met   PT LONG TERM GOAL #5   Title Pt will score FOTO for L spine < 48% limited to demo functional improvement    Status Achieved   PT LONG TERM GOAL #6   Title Pt will be able to use gym equipment without pain increase.     Status Deferred   PT LONG TERM GOAL #7   Title Pt will report 50% less occurances/day of hyperextension of Rt. knee in order to improve bimechanically correct gait mechanics to prevent knee injury    Status Achieved   PT LONG TERM GOAL #8   Title Berg balance goal 36/56   Status Deferred               Plan - 04/30/15 0946    Clinical Impression Statement Patient with less overall pain today.  She has improved her FOTO score (knee) to beyond what was predicted.  She would like to see a PT for the next 4 weeks for trigger point dry needling.  She continues to have pain and impaired functional mobility, weakness.  It is not  likely for her to gain much more strength in knees but I do see potential for core strength and stability in hips with PT intervention.    Pt will  benefit from skilled therapeutic intervention in order to improve on the following deficits Increased fascial restricitons;Pain;Impaired tone;Postural dysfunction;Decreased mobility;Decreased activity tolerance;Decreased range of motion;Decreased strength;Difficulty walking;Decreased balance;Impaired flexibility   Rehab Potential Good   PT Frequency 2x / week  1-2 times per week as schedule allows   PT Duration 4 weeks   PT Treatment/Interventions Functional mobility training;Patient/family education;Passive range of motion;Therapeutic exercise;Ultrasound;Gait training;Manual techniques;Neuromuscular re-education;Dry needling;Moist Heat;Therapeutic activities;Cryotherapy   PT Next Visit Plan [place] to L trunk, try a quadruped plank, standing ex to challenge   PT Home Exercise Plan has all HEP and is I with those.  Cont to encourage regular schedule.    Consulted and Agree with Plan of Care Patient          G-Codes - May 30, 2015 0953    Functional Assessment Tool Used Clinical Judgement   Functional Limitation Other PT subsequent   Mobility: Walking and Moving Around Current Status [phone]) At least 40 percent but less than 60 percent impaired, limited or restricted   Mobility: Walking and Moving Around Goal Status [phone]) At least 20 percent but less than 40 percent impaired, limited or restricted      Problem List There are no active problems to display for this patient.   Ashlee [name] 2015-05-30, 9:59 AM  [facility] [address] [place], [place], [address] Phone: [phone]   Fax:  [phone]   Raeford Razor, PT 05/30/15 10:00 AM Phone: [phone] Fax: [phone]

## 2015-05-14 ENCOUNTER — Encounter: Payer: Medicare Other | Admitting: Physical Therapy

## 2015-05-14 ENCOUNTER — Ambulatory Visit: Payer: Medicare Other | Attending: Orthopedic Surgery | Admitting: Physical Therapy

## 2015-05-14 DIAGNOSIS — M4120 Other idiopathic scoliosis, site unspecified: Secondary | ICD-10-CM | POA: Diagnosis present

## 2015-05-14 DIAGNOSIS — R29898 Other symptoms and signs involving the musculoskeletal system: Secondary | ICD-10-CM

## 2015-05-14 DIAGNOSIS — M6289 Other specified disorders of muscle: Secondary | ICD-10-CM | POA: Insufficient documentation

## 2015-05-14 DIAGNOSIS — R262 Difficulty in walking, not elsewhere classified: Secondary | ICD-10-CM | POA: Diagnosis present

## 2015-05-14 DIAGNOSIS — Z4782 Encounter for orthopedic aftercare following scoliosis surgery: Secondary | ICD-10-CM | POA: Insufficient documentation

## 2015-05-14 DIAGNOSIS — M546 Pain in thoracic spine: Secondary | ICD-10-CM | POA: Insufficient documentation

## 2015-05-14 DIAGNOSIS — M25561 Pain in right knee: Secondary | ICD-10-CM | POA: Diagnosis present

## 2015-05-14 DIAGNOSIS — G8929 Other chronic pain: Secondary | ICD-10-CM | POA: Insufficient documentation

## 2015-05-14 NOTE — Therapy (Signed)
[place], [place], [address] Phone: [phone]   Fax:  [phone]  Physical Therapy Treatment  Patient Details  Name: Ashlee [name] MRN: [id number] Date of Birth: 04/08/1986 Referring Provider:  Rowe Pavy, MD  Encounter Date: 05/14/2015      PT End of Session - 05/14/15 0844    Visit Number 22   Number of Visits 29   Date for PT Re-Evaluation 06/07/15   PT Start Time 0803   PT Stop Time 0845   PT Time Calculation (min) 42 min   Activity Tolerance Patient tolerated treatment well   Behavior During Therapy [facility] for tasks assessed/performed      Past Medical History  Diagnosis Date  . Scoliosis   . Rheumatoid arthritis(714.0)   . Mitral valve prolapse   . Patent foramen ovale   . Back pain   . Restless leg syndrome   . Spinal cord disease   . Sciatica     Past Surgical History  Procedure Laterality Date  . Back surgery    . Cardiac catheterization      There were no vitals filed for this visit.  Visit Diagnosis:  Tight unbalanced musculature  Decreased strength involving knee joint  Weakness of both hips      Subjective Assessment - 05/14/15 0810    Subjective 5/10 mostly radiating leg IT band piriformiis    Currently in Pain? Yes   Pain Score 5    Pain Location Back   Pain Orientation Right;Left   Pain Descriptors / Indicators Radiating   Pain Radiating Towards legs   Aggravating Factors  standing, getting worse as days goes on   Pain Relieving Factors stretching   Multiple Pain Sites Yes  Wrist/ fingers intermittantly form RA, not treating.                         [facility] Adult PT Treatment/Exercise - 05/14/15 0820    Lumbar Exercises: Stretches   Quad Stretch 3 reps;30 seconds  each passive   Lumbar Exercises: Standing   Other Standing Lumbar Exercises Pallof press 2 sets 10 red band with increasing trension after 1 st set.   Other Standing Lumbar  Exercises band diagonals and horizantal abduction SBS , cues   Lumbar Exercises: Quadruped   Single Arm Raise 10 reps   Plank 3 X 20 seconds                  PT Short Term Goals - 05/14/15 0849    PT SHORT TERM GOAL #3   Title Pt will be able to tolerate standing for up to 10 min most days of the week, pain <3/10 in low, middle back   Baseline not met for pain   Time 4   Period Weeks   Status Partially Met           PT Long Term Goals - 04/30/15 0951    PT LONG TERM GOAL #1   Title Pt will be I with advanced HEP   Status On-going   PT LONG TERM GOAL #2   Title Pt will be able to walk as needed in the community with LRAD with pain < 3/10 most of the time.    Status Partially Met   PT LONG TERM GOAL #3   Title Pt will report min decrease in LE pain with household activities.    Status Partially Met   PT LONG  TERM GOAL #4   Title Pt will increase hip abd strength to 3+/5 bilateral to ease walking.    Status Partially Met   PT LONG TERM GOAL #5   Title Pt will score FOTO for L spine < 48% limited to demo functional improvement    Status Achieved   PT LONG TERM GOAL #6   Title Pt will be able to use gym equipment without pain increase.     Status Deferred   PT LONG TERM GOAL #7   Title Pt will report 50% less occurances/day of hyperextension of Rt. knee in order to improve bimechanically correct gait mechanics to prevent knee injury    Status Achieved   PT LONG TERM GOAL #8   Title Berg balance goal 36/56   Status Deferred               Plan - 05/14/15 0848    PT Next Visit Plan DN, stretch/exercise per Ethlyn Gallery and Agree with Plan of Care Patient        Problem List There are no active problems to display for this patient.   [facility] 05/14/2015, 8:50 AM  [facility] [address] [place], [place], [address] Phone: [phone]   Fax:  [phone]     Melvenia Needles,  PTA 05/14/2015 8:50 AM Phone: [phone] Fax: [phone]

## 2015-05-16 ENCOUNTER — Ambulatory Visit: Payer: Medicare Other | Admitting: Physical Therapy

## 2015-05-16 DIAGNOSIS — R29898 Other symptoms and signs involving the musculoskeletal system: Secondary | ICD-10-CM | POA: Diagnosis not present

## 2015-05-16 DIAGNOSIS — R262 Difficulty in walking, not elsewhere classified: Secondary | ICD-10-CM

## 2015-05-16 DIAGNOSIS — Z4782 Encounter for orthopedic aftercare following scoliosis surgery: Secondary | ICD-10-CM

## 2015-05-16 DIAGNOSIS — M4120 Other idiopathic scoliosis, site unspecified: Secondary | ICD-10-CM

## 2015-05-16 DIAGNOSIS — G8929 Other chronic pain: Secondary | ICD-10-CM

## 2015-05-16 DIAGNOSIS — M546 Pain in thoracic spine: Secondary | ICD-10-CM

## 2015-05-16 DIAGNOSIS — M25561 Pain in right knee: Secondary | ICD-10-CM

## 2015-05-16 NOTE — Patient Instructions (Addendum)
Trigger Point Dry Needling  . What is Trigger Point Dry Needling (DN)? o DN is a physical therapy technique used to treat muscle pain and dysfunction. Specifically, DN helps deactivate muscle trigger points (muscle knots).  o A thin filiform needle is used to penetrate the skin and stimulate the underlying trigger point. The goal is for a local twitch response (LTR) to occur and for the trigger point to relax. No medication of any kind is injected during the procedure.   . What Does Trigger Point Dry Needling Feel Like?  o The procedure feels different for each individual patient. Some patients report that they do not actually feel the needle enter the skin and overall the process is not painful. Very mild bleeding may occur. However, many patients feel a deep cramping in the muscle in which the needle was inserted. This is the local twitch response.   Marland Kitchen How Will I feel after the treatment? o Soreness is normal, and the onset of soreness may not occur for a few hours. Typically this soreness does not last longer than two days.  o Bruising is uncommon, however; ice can be used to decrease any possible bruising.  o In rare cases feeling tired or nauseous after the treatment is normal. In addition, your symptoms may get worse before they get better, this period will typically not last longer than 24 hours.   . What Can I do After My Treatment? o Increase your hydration by drinking more water for the next 24 hours. o You may place ice or heat on the areas treated that have become sore, however, do not use heat on inflamed or bruised areas. Heat often brings more relief post needling. o You can continue your regular activities, but vigorous activity is not recommended initially after the treatment for 24 hours. o DN is best combined with other physical therapy such as strengthening, stretching, and other therapies.     Garen Lah, PT 05/16/2015 10:22 AM Phone: [phone] Fax:  [phone]

## 2015-05-16 NOTE — Therapy (Signed)
[place], [place], [address] Phone: [phone]   Fax:  [phone]  Physical Therapy Treatment  Patient Details  Name: Ashlee [name] MRN: [id number] Date of Birth: [birth year] Referring Provider:  Rowe Pavy, MD  Encounter Date: 05/16/2015      PT End of Session - 05/16/15 1200    Visit Number 23   Number of Visits 29   Date for PT Re-Evaluation 06/07/15   PT Start Time 1016   PT Stop Time 1113   PT Time Calculation (min) 57 min   Activity Tolerance Patient tolerated treatment well   Behavior During Therapy [facility] for tasks assessed/performed      Past Medical History  Diagnosis Date  . Scoliosis   . Rheumatoid arthritis(714.0)   . Mitral valve prolapse   . Patent foramen ovale   . Back pain   . Restless leg syndrome   . Spinal cord disease   . Sciatica     Past Surgical History  Procedure Laterality Date  . Back surgery    . Cardiac catheterization      There were no vitals filed for this visit.  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  Total # surgeries is 4.  angina, heart murmur, IBS, RA, anemia, RLS. RA of both knees, currently in remission, per pt.      Diagnostic tests XR by Dr. Marykay Lex last month and rods were fine.    Patient Stated Goals Wants to strengthen musciulature around the knee, stretch tight muscles, and relieve pain.    Currently in Pain? Yes   Pain Score 5    Pain Location Back   Pain  Orientation Right;Left   Pain Descriptors / Indicators Radiating   Pain Type Chronic pain   Pain Onset More than a month ago   Pain Frequency Intermittent   Pain Location Hip  piriformis   Pain Orientation Right   Pain Descriptors / Indicators Tightness   Pain Type Chronic pain   Pain Onset More than a month ago   Pain Frequency Intermittent                         [facility] Adult PT Treatment/Exercise - 05/16/15 1016    Knee/Hip Exercises: Stretches   Quad Stretch 5 reps;30 seconds  with contract relax with PT    Piriformis Stretch 3 reps;30 seconds  right    Manual Therapy   Manual Therapy Myofascial release;Soft tissue mobilization   Soft tissue mobilization Piriformis/Gluts on Right and Right rectus femoris and vastus lateralis soft tissue post TDN   Myofascial Release IASTYM tool to quad with pt in supine and over the plinth stretch/contract relax          Trigger Point Dry Needling - 05/16/15 1028    Consent Given? Yes   Education Handout Provided Yes   Muscles Treated Lower Body Gluteus maximus;Piriformis;Quadriceps  Right side only  Gluteus Maximus Response Twitch response elicited;Palpable increased muscle length  right side only   Piriformis Response Twitch response elicited;Palpable increased muscle length  right side onlyt   Quadriceps Response Twitch response elicited;Palpable increased muscle length  right side only              PT Education - 05/16/15 1027    Education provided Yes   Education Details Precautians for dry needling and need to continue piriformis/quad stretching at home   Person(s) Educated Patient   Methods Explanation;Demonstration;Handout   Comprehension Verbalized understanding;Returned demonstration          PT Short Term Goals - 05/14/15 0849    PT SHORT TERM GOAL #3   Title Pt will be able to tolerate standing for up to 10 min most days of the week, pain <3/10 in low, middle back   Baseline not met for  pain   Time 4   Period Weeks   Status Partially Met           PT Long Term Goals - 04/30/15 0951    PT LONG TERM GOAL #1   Title Pt will be I with advanced HEP   Status On-going   PT LONG TERM GOAL #2   Title Pt will be able to walk as needed in the community with LRAD with pain < 3/10 most of the time.    Status Partially Met   PT LONG TERM GOAL #3   Title Pt will report min decrease in LE pain with household activities.    Status Partially Met   PT LONG TERM GOAL #4   Title Pt will increase hip abd strength to 3+/5 bilateral to ease walking.    Status Partially Met   PT LONG TERM GOAL #5   Title Pt will score FOTO for L spine < 48% limited to demo functional improvement    Status Achieved   PT LONG TERM GOAL #6   Title Pt will be able to use gym equipment without pain increase.     Status Deferred   PT LONG TERM GOAL #7   Title Pt will report 50% less occurances/day of hyperextension of Rt. knee in order to improve bimechanically correct gait mechanics to prevent knee injury    Status Achieved   PT LONG TERM GOAL #8   Title Berg balance goal 36/56   Status Deferred               Plan - 05/16/15 1200    Clinical Impression Statement Pt tolerated TDN for right piriformis, gluts and quads.  Pt was educated on precautians of TDN .  Pt 5/10 pain level and had palpable lengthening of rectus femoris with manual techniques. Will continue for 1 -2 more times fto assess the effectiveness of TDN for pt.   Pt will benefit from skilled therapeutic intervention in order to improve on the following deficits Increased fascial restricitons;Pain;Impaired tone;Postural dysfunction;Decreased mobility;Decreased activity tolerance;Decreased range of motion;Decreased strength;Difficulty walking;Decreased balance;Impaired flexibility   Rehab Potential Good   PT Frequency 2x / week   PT Duration 4 weeks   PT Treatment/Interventions Functional mobility training;Patient/family  education;Passive range of motion;Therapeutic exercise;Ultrasound;Gait training;Manual techniques;Neuromuscular re-education;Dry needling;Moist Heat;Therapeutic activities;Cryotherapy   PT Next Visit Plan DN, stretch/exercise per [place] has all HEP and is I with those.  Cont to encourage regular schedule.    Consulted and Agree with Plan of Care Patient        Problem  List There are no active problems to display for this patient.   Voncille Lo, PT 05/16/2015 12:04 PM Phone: [phone] Fax: [facility] [place], [place], [address] Phone: [phone]   Fax:  [phone]

## 2015-05-21 ENCOUNTER — Ambulatory Visit: Payer: Medicare Other | Admitting: Physical Therapy

## 2015-05-21 DIAGNOSIS — R29898 Other symptoms and signs involving the musculoskeletal system: Secondary | ICD-10-CM

## 2015-05-21 DIAGNOSIS — M25561 Pain in right knee: Secondary | ICD-10-CM

## 2015-05-21 DIAGNOSIS — Z4782 Encounter for orthopedic aftercare following scoliosis surgery: Secondary | ICD-10-CM

## 2015-05-21 DIAGNOSIS — G8929 Other chronic pain: Secondary | ICD-10-CM

## 2015-05-21 DIAGNOSIS — M546 Pain in thoracic spine: Secondary | ICD-10-CM

## 2015-05-21 DIAGNOSIS — M4120 Other idiopathic scoliosis, site unspecified: Secondary | ICD-10-CM

## 2015-05-21 DIAGNOSIS — R262 Difficulty in walking, not elsewhere classified: Secondary | ICD-10-CM

## 2015-05-21 NOTE — Patient Instructions (Signed)
Discussed with pt about treating bruise and administered KT tape for edema over bruise site.  Pt verbalized understanding of use of ice and tape for edema.  No dry needling for site proximal quad today.  Garen Lah, PT 05/21/2015 12:10 PM Phone: [phone] Fax: [phone]

## 2015-05-21 NOTE — Therapy (Signed)
[place], [place], [address] Phone: [phone]   Fax:  [phone]  Physical Therapy Treatment  Patient Details  Name: Ashlee [name] MRN: [id number] Date of Birth: [birth year] Referring Provider:  Rowe Pavy, MD  Encounter Date: 05/21/2015      PT End of Session - 05/21/15 1214    Visit Number 24   Number of Visits 29   Date for PT Re-Evaluation 06/07/15   PT Start Time 8413   PT Stop Time 1114   PT Time Calculation (min) 59 min   Activity Tolerance Patient tolerated treatment well  extra cautian for needlle stick due to bruising   Behavior During Therapy [facility] for tasks assessed/performed      Past Medical History  Diagnosis Date  . Scoliosis   . Rheumatoid arthritis(714.0)   . Mitral valve prolapse   . Patent foramen ovale   . Back pain   . Restless leg syndrome   . Spinal cord disease   . Sciatica     Past Surgical History  Procedure Laterality Date  . Back surgery    . Cardiac catheterization      There were no vitals filed for this visit.  Visit Diagnosis:  Tight unbalanced musculature  Knee pain, chronic, right  Idiopathic scoliosis  Difficulty walking up stairs  Left-sided thoracic back pain  Weakness of both hips  Decreased strength involving knee joint  Encounter for orthopedic aftercare following scoliosis surgery      Subjective Assessment - 05/21/15 1021    Subjective I had significant bruising on my quad  . My overall pain was improved 5/10 to 4/10.  My mom and I are going to check my blood and platelets.  I have noticed I bruise more easily lately.  I laid on a vacuum cleaner hose and I noticed I bruised lately as well.    Pertinent History SCI age 45,  Spine surgeries with broken rods (L5 x 2), she is 11 mo s/p T8 iliac wing revision.  Total # surgeries is 4.  angina, heart murmur, IBS, RA, anemia, RLS. RA of both knees, currently in remission, per pt.      Patient Stated Goals Wants to strengthen musciulature around the knee, stretch tight muscles, and relieve pain.    Currently in Pain? Yes   Pain Score 4    Pain Location Back   Pain Orientation Right;Left   Pain Descriptors / Indicators Radiating   Pain Type Chronic pain   Pain Onset More than a month ago   Pain Frequency Intermittent   Pain Score 2   Pain Location Knee  Right proximal quad   Pain Orientation Right   Pain Type Acute pain   Pain Onset In the past 7 days                         [facility] Adult PT Treatment/Exercise - 05/21/15 1029    Knee/Hip Exercises: Stretches   Sports administrator 5 reps;30 seconds  with contract relax with PT    Piriformis Stretch 3 reps;30 seconds  right    Moist Heat Therapy   Number Minutes Moist Heat 15 Minutes   Moist Heat Location Hip;Knee  right piriformis and R quad   Manual Therapy   Manual Therapy Myofascial release;Soft tissue mobilization   Soft tissue mobilization Piriformis/Gluts on Right and Right rectus femoris and vastus lateralis soft tissue post TDN   Myofascial Release IASTYM  tool to quad with pt in supine and over the plinth stretch/contract relax   Kinesiotix   Edema KT tape with [employer]we[employer] over proximal quad bruise          Trigger Point Dry Needling - 05/21/15 1030    Consent Given? Yes   Education Handout Provided --  no previously given   Muscles Treated Lower Body Gluteus maximus;Piriformis;Quadriceps  right side only distal rectus femoris and Vastus Lateralis   Gluteus Maximus Response Twitch response elicited;Palpable increased muscle length   Piriformis Response Twitch response elicited   Quadriceps Response Twitch response elicited;Palpable increased muscle length  Quad distal insertion , avoid proximal due to bruising.              PT Education - 05/21/15 1025    Education provided Yes   Education Details Pt educated on care of bruise and use of ice for pain.  Pt also  educated on sue of KT tape for swelling and healing of bruised area quicker   Person(s) Educated Patient   Methods Explanation   Comprehension Verbalized understanding;Returned demonstration          PT Short Term Goals - 05/14/15 0849    PT SHORT TERM GOAL #3   Title Pt will be able to tolerate standing for up to 10 min most days of the week, pain <3/10 in low, middle back   Baseline not met for pain   Time 4   Period Weeks   Status Partially Met           PT Long Term Goals - 04/30/15 0951    PT LONG TERM GOAL #1   Title Pt will be I with advanced HEP   Status On-going   PT LONG TERM GOAL #2   Title Pt will be able to walk as needed in the community with LRAD with pain < 3/10 most of the time.    Status Partially Met   PT LONG TERM GOAL #3   Title Pt will report min decrease in LE pain with household activities.    Status Partially Met   PT LONG TERM GOAL #4   Title Pt will increase hip abd strength to 3+/5 bilateral to ease walking.    Status Partially Met   PT LONG TERM GOAL #5   Title Pt will score FOTO for L spine < 48% limited to demo functional improvement    Status Achieved   PT LONG TERM GOAL #6   Title Pt will be able to use gym equipment without pain increase.     Status Deferred   PT LONG TERM GOAL #7   Title Pt will report 50% less occurances/day of hyperextension of Rt. knee in order to improve bimechanically correct gait mechanics to prevent knee injury    Status Achieved   PT LONG TERM GOAL #8   Title Berg balance goal 36/56   Status Deferred               Plan - 05/21/15 1215    Clinical Impression Statement Pt decreased pain from 5/10 to 4/10 this week, but pt with significant bruising on proximal quad ( rectus femoris.)  PT avoided area today and administered KT tape for edemal to area.  Pt has remarked that she has noticed more bruising lately and that she had laid on  a vacuum cleaner  hose with a bruise afterwards.  Pt/PT discussed need  for seeing an MD if she has such reactions  and no known cause.  Pt is scheduled for 1 more DN visit before DC.  Will continue for 1 more visit and then D C since pt is recieving relief from TDN   Pt will benefit from skilled therapeutic intervention in order to improve on the following deficits Increased fascial restricitons;Pain;Impaired tone;Postural dysfunction;Decreased mobility;Decreased activity tolerance;Decreased range of motion;Decreased strength;Difficulty walking;Decreased balance;Impaired flexibility   PT Frequency 2x / week   PT Duration 4 weeks   PT Treatment/Interventions Functional mobility training;Patient/family education;Passive range of motion;Therapeutic exercise;Ultrasound;Gait training;Manual techniques;Neuromuscular re-education;Dry needling;Moist Heat;Therapeutic activities;Cryotherapy   PT Next Visit Plan DN/ manual and assess goals and then DC next visit  and do FOTO for last visit        Problem List There are no active problems to display for this patient. Voncille Lo, PT 05/21/2015 12:22 PM Phone: [phone] Fax: [facility] [place], [place], [address] Phone: [phone]   Fax:  [phone]

## 2015-05-23 ENCOUNTER — Ambulatory Visit: Payer: Medicare Other | Admitting: Physical Therapy

## 2015-05-23 DIAGNOSIS — M25561 Pain in right knee: Secondary | ICD-10-CM

## 2015-05-23 DIAGNOSIS — M4120 Other idiopathic scoliosis, site unspecified: Secondary | ICD-10-CM

## 2015-05-23 DIAGNOSIS — R262 Difficulty in walking, not elsewhere classified: Secondary | ICD-10-CM

## 2015-05-23 DIAGNOSIS — R29898 Other symptoms and signs involving the musculoskeletal system: Secondary | ICD-10-CM

## 2015-05-23 DIAGNOSIS — G8929 Other chronic pain: Secondary | ICD-10-CM

## 2015-05-23 NOTE — Therapy (Addendum)
[place], [place], [address] Phone: [phone]   Fax:  [phone]  Physical Therapy Treatment/Discharge Summary  Patient Details  Name: Ashlee [name] MRN: [id number] Date of Birth: [birth year] Referring Provider:  Rowe Pavy, MD  Encounter Date: 05/23/2015      PT End of Session - 05/23/15 1711    Visit Number 25   Number of Visits 29   Date for PT Re-Evaluation 06/07/15   PT Start Time 0488   PT Stop Time 1112   PT Time Calculation (min) 57 min   Activity Tolerance Patient tolerated treatment well      Past Medical History  Diagnosis Date  . Scoliosis   . Rheumatoid arthritis(714.0)   . Mitral valve prolapse   . Patent foramen ovale   . Back pain   . Restless leg syndrome   . Spinal cord disease   . Sciatica     Past Surgical History  Procedure Laterality Date  . Back surgery    . Cardiac catheterization      There were no vitals filed for this visit.  Visit Diagnosis:  Tight unbalanced musculature  Knee pain, chronic, right  Idiopathic scoliosis  Difficulty walking up stairs      Subjective Assessment - 05/23/15 1011    Subjective I think the needling helps a little in the piriformis/buttock pain.  Less bruising this past time.  More radiating pain today anterior thighs bilaterally.  Ordered a piece of equipment called [employer] CA-27 to help with leg stretching.     Currently in Pain? Yes   Pain Score 5    Pain Orientation Right;Left   Pain Type Chronic pain   Aggravating Factors  standing   Pain Relieving Factors stretching             [facility] PT Assessment - 05/23/15 1017    Observation/Other Assessments   Focus on Therapeutic Outcomes (FOTO)  42% limit   Strength   Right Hip ABduction 3+/5   Left Hip ABduction 3+/5   Right Knee Extension 4/5   Left Knee Extension 4/5                     [facility] Adult PT Treatment/Exercise - 05/23/15 0001    Moist Heat Therapy   Number Minutes Moist Heat 10 Minutes   Moist Heat Location Hip;Knee   Manual Therapy   Manual Therapy Soft tissue mobilization   Soft tissue mobilization Piriformis/Gluts on Right and Right rectus femoris and vastus lateralis soft tissue post TDN          Trigger Point Dry Needling - 05/23/15 1709    Consent Given? Yes   Muscles Treated Lower Body Gluteus maximus;Piriformis;Tensor fascia lata;Quadriceps   Gluteus Maximus Response Palpable increased muscle length   Piriformis Response Palpable increased muscle length   Tensor Fascia Lata Response Palpable increased muscle length   Quadriceps Response Palpable increased muscle length        Right side only        PT Short Term Goals - 05/23/15 1722    PT SHORT TERM GOAL #1   Title Pt will be able to report less pain with transitions (bed mobility), coughing, deep breathing (25% improved)   Status Achieved   PT SHORT TERM GOAL #2   Title Pt will be I with HEP for spinal stabilization   Status Achieved   PT SHORT TERM GOAL #3   Title Pt will  be able to tolerate standing for up to 10 min most days of the week, pain <3/10 in low, middle back   Baseline not met for pain   Time 4   Period Weeks   Status Partially Met   PT SHORT TERM GOAL #4   Title Pt's Rt knee flexion/ext will improve to 4+/5 to stabilize the knee during wt bearing activities   Baseline 4/5 in avail ROM   Time 4   Period Weeks   Status On-going   PT SHORT TERM GOAL #5   Title Pt will report 25% less occurances/day of hyperextension of Rt. knee in order to improve bimechanically correct gait mechanics to prevent knee injury    Status Achieved           PT Long Term Goals - 01-Jun-2015 1723    PT LONG TERM GOAL #1   Title Pt will be I with advanced HEP   Status Achieved   PT LONG TERM GOAL #2   Title Pt will be able to walk as needed in the community with LRAD with pain < 3/10 most of the time.    Status Partially Met   PT LONG  TERM GOAL #3   Title Pt will report min decrease in LE pain with household activities.    Status Achieved   PT LONG TERM GOAL #4   Title Pt will increase hip abd strength to 3+/5 bilateral to ease walking.    Status Achieved   PT LONG TERM GOAL #5   Title Pt will score FOTO for L spine < 48% limited to demo functional improvement    Status Achieved   PT LONG TERM GOAL #6   Title Pt will be able to use gym equipment without pain increase.     Status Partially Met   PT LONG TERM GOAL #7   Title Pt will report 50% less occurances/day of hyperextension of Rt. knee in order to improve bimechanically correct gait mechanics to prevent knee injury    Status Achieved   PT LONG TERM GOAL #8   Title Berg balance goal 36/56   Status Deferred               Plan - 01-Jun-2015 1712    Clinical Impression Statement Pain level remains 5/10 typically.  She has completed a comprehensive course a PT including therapeutic exercises, modalities, manual therapy, dry needling.  She is independent in a HEP and safe self progression.  Her FOTO functional outcome score improved from 63% limitation on intake to 42% at discharge.    She reports some improvement dry needling although additional long term goals not met.  Discharge from PT with partial goals met.            G-Codes - 06/01/2015 1724    Functional Assessment Tool Used Clinical Judgement   Functional Limitation Mobility: Walking and moving around   Mobility: Walking and Moving Around Discharge Status [phone]) At least 20 percent but less than 40 percent impaired, limited or restricted    Other PT secondary discharge status CJ 20-40% PHYSICAL THERAPY DISCHARGE SUMMARY  Visits from Start of Care: 25  Current functional level related to goals / functional outcomes: See clinical impressions above.  Patient's progress has plateaued and she is independent with HEP   Remaining deficits: See above   Education / Equipment: HEP Plan: Patient  agrees to discharge.  Patient goals were partially met. Patient is being discharged due to meeting the stated rehab  goals.  ?????       Problem List There are no active problems to display for this patient.  Ruben Im, PT 05/23/2015 5:30 PM Phone: [phone] Fax: [phone]  Alvera Singh 05/23/2015, 5:28 PM  [place], [place], [address] Phone: [phone]   Fax:  [phone]

## 2015-06-08 ENCOUNTER — Encounter (HOSPITAL_COMMUNITY): Payer: Self-pay | Admitting: Emergency Medicine

## 2015-06-08 ENCOUNTER — Emergency Department (HOSPITAL_COMMUNITY)
Admission: EM | Admit: 2015-06-08 | Discharge: 2015-06-08 | Disposition: A | Discharge status: Home / Self Care | Payer: Medicare Other | Attending: Emergency Medicine | Admitting: Emergency Medicine

## 2015-06-08 DIAGNOSIS — Y9289 Other specified places as the place of occurrence of the external cause: Secondary | ICD-10-CM | POA: Insufficient documentation

## 2015-06-08 DIAGNOSIS — Y998 Other external cause status: Secondary | ICD-10-CM | POA: Diagnosis not present

## 2015-06-08 DIAGNOSIS — Z23 Encounter for immunization: Secondary | ICD-10-CM | POA: Diagnosis not present

## 2015-06-08 DIAGNOSIS — T24201D Burn of second degree of unspecified site of right lower limb, except ankle and foot, subsequent encounter: Secondary | ICD-10-CM | POA: Diagnosis not present

## 2015-06-08 DIAGNOSIS — Z7982 Long term (current) use of aspirin: Secondary | ICD-10-CM | POA: Diagnosis not present

## 2015-06-08 DIAGNOSIS — Z8739 Personal history of other diseases of the musculoskeletal system and connective tissue: Secondary | ICD-10-CM | POA: Insufficient documentation

## 2015-06-08 DIAGNOSIS — Z8669 Personal history of other diseases of the nervous system and sense organs: Secondary | ICD-10-CM | POA: Diagnosis not present

## 2015-06-08 DIAGNOSIS — Y9389 Activity, other specified: Secondary | ICD-10-CM | POA: Diagnosis not present

## 2015-06-08 DIAGNOSIS — W92XXXD Exposure to excessive heat of man-made origin, subsequent encounter: Secondary | ICD-10-CM | POA: Diagnosis not present

## 2015-06-08 DIAGNOSIS — Z79899 Other long term (current) drug therapy: Secondary | ICD-10-CM | POA: Diagnosis not present

## 2015-06-08 DIAGNOSIS — T24001D Burn of unspecified degree of unspecified site of right lower limb, except ankle and foot, subsequent encounter: Secondary | ICD-10-CM | POA: Diagnosis present

## 2015-06-08 MED ORDER — TETANUS-DIPHTH-ACELL PERTUSSIS 5-2.5-18.5 LF-MCG/0.5 IM SUSP
0.5000 mL | Freq: Once | INTRAMUSCULAR | Status: AC
  Administered 2015-06-08: 0.5 mL via INTRAMUSCULAR
  Filled 2015-06-08: qty 0.5

## 2015-06-08 MED ORDER — SILVER SULFADIAZINE 1 % EX CREA
TOPICAL_CREAM | Freq: Once | CUTANEOUS | Status: AC
  Administered 2015-06-08: 11:00:00 via TOPICAL
  Filled 2015-06-08: qty 85

## 2015-06-08 NOTE — Discharge Instructions (Signed)
Burn Care Burns hurt your skin. When your skin is hurt, it is easier to get an infection. Follow your doctor's directions to help prevent an infection. HOME CARE  Wash your hands well before you change your bandage.  Change your bandage as often as told by your doctor.  Remove the old bandage. If the bandage sticks, soak it off with cool, clean water.  Gently clean the burn with mild soap and water.  Pat the burn dry with a clean, dry cloth.  Put a thin layer of medicated cream on the burn.  Put a clean bandage on as told by your doctor.  Keep the bandage clean and dry.  Raise (elevate) the burn for the first 24 hours. After that, follow your doctor's directions.  Only take medicine as told by your doctor. GET HELP RIGHT AWAY IF:   You have too much pain.  The skin near the burn is red, tender, puffy (swollen), or has red streaks.  The burn area has yellowish white fluid (pus) or a bad smell coming from it.  You have a fever. MAKE SURE YOU:   Understand these instructions.  Will watch your condition.  Will get help right away if you are not doing well or get worse. Document Released: 07/28/2008 Document Revised: 01/11/2012 Document Reviewed: 03/11/2011 [facility] Patient Information 2015 [place], [place]. This information is not intended to replace advice given to you by your health care provider. Make sure you discuss any questions you have with your health care provider.  Please contact your primary care provider inform them of your visit today. Please request follow-up evaluation. Please contact the burn center for immediate follow-up evaluation. Please apply Silvadene cream once to twice daily. Warm water soaks with soft debridement. Monitor for new or worsening signs or infection, follow-up immediately if any present.

## 2015-06-08 NOTE — ED Notes (Signed)
EDP at bedside  
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
## 2015-09-02 ENCOUNTER — Other Ambulatory Visit (HOSPITAL_COMMUNITY): Payer: Self-pay | Admitting: Orthopedic Surgery

## 2015-09-02 DIAGNOSIS — M79604 Pain in right leg: Secondary | ICD-10-CM

## 2015-09-02 DIAGNOSIS — M79605 Pain in left leg: Secondary | ICD-10-CM

## 2015-09-02 DIAGNOSIS — M546 Pain in thoracic spine: Secondary | ICD-10-CM

## 2015-09-06 ENCOUNTER — Ambulatory Visit (HOSPITAL_COMMUNITY): Admission: RE | Admit: 2015-09-06 | Payer: Medicare Other | Source: Ambulatory Visit

## 2015-09-11 ENCOUNTER — Ambulatory Visit (HOSPITAL_COMMUNITY)
Admission: RE | Admit: 2015-09-11 | Discharge: 2015-09-11 | Disposition: A | Discharge status: Home / Self Care | Payer: Medicare Other | Source: Ambulatory Visit | Attending: Orthopedic Surgery | Admitting: Orthopedic Surgery

## 2015-09-11 DIAGNOSIS — M4185 Other forms of scoliosis, thoracolumbar region: Secondary | ICD-10-CM | POA: Diagnosis not present

## 2015-09-11 DIAGNOSIS — M79605 Pain in left leg: Secondary | ICD-10-CM

## 2015-09-11 DIAGNOSIS — M4682 Other specified inflammatory spondylopathies, cervical region: Secondary | ICD-10-CM | POA: Diagnosis not present

## 2015-09-11 DIAGNOSIS — M4684 Other specified inflammatory spondylopathies, thoracic region: Secondary | ICD-10-CM | POA: Insufficient documentation

## 2015-09-11 DIAGNOSIS — Y838 Other surgical procedures as the cause of abnormal reaction of the patient, or of later complication, without mention of misadventure at the time of the procedure: Secondary | ICD-10-CM | POA: Diagnosis not present

## 2015-09-11 DIAGNOSIS — M960 Pseudarthrosis after fusion or arthrodesis: Secondary | ICD-10-CM | POA: Insufficient documentation

## 2015-09-11 DIAGNOSIS — M4683 Other specified inflammatory spondylopathies, cervicothoracic region: Secondary | ICD-10-CM | POA: Diagnosis not present

## 2015-09-11 DIAGNOSIS — M546 Pain in thoracic spine: Secondary | ICD-10-CM | POA: Insufficient documentation

## 2015-09-11 DIAGNOSIS — M79604 Pain in right leg: Secondary | ICD-10-CM

## 2016-04-03 ENCOUNTER — Ambulatory Visit (HOSPITAL_COMMUNITY): Payer: Medicare Other

## 2016-04-03 ENCOUNTER — Encounter (HOSPITAL_COMMUNITY): Payer: Self-pay | Admitting: *Deleted

## 2016-04-03 ENCOUNTER — Ambulatory Visit (HOSPITAL_COMMUNITY)
Admission: EM | Admit: 2016-04-03 | Discharge: 2016-04-03 | Disposition: A | Discharge status: Home / Self Care | Payer: Medicare Other | Attending: Emergency Medicine | Admitting: Emergency Medicine

## 2016-04-03 DIAGNOSIS — M419 Scoliosis, unspecified: Secondary | ICD-10-CM | POA: Insufficient documentation

## 2016-04-03 DIAGNOSIS — B370 Candidal stomatitis: Secondary | ICD-10-CM | POA: Diagnosis not present

## 2016-04-03 DIAGNOSIS — G2581 Restless legs syndrome: Secondary | ICD-10-CM | POA: Diagnosis not present

## 2016-04-03 DIAGNOSIS — M549 Dorsalgia, unspecified: Secondary | ICD-10-CM | POA: Diagnosis present

## 2016-04-03 DIAGNOSIS — Z7982 Long term (current) use of aspirin: Secondary | ICD-10-CM | POA: Insufficient documentation

## 2016-04-03 DIAGNOSIS — R52 Pain, unspecified: Secondary | ICD-10-CM | POA: Insufficient documentation

## 2016-04-03 DIAGNOSIS — M069 Rheumatoid arthritis, unspecified: Secondary | ICD-10-CM | POA: Insufficient documentation

## 2016-04-03 DIAGNOSIS — Z79899 Other long term (current) drug therapy: Secondary | ICD-10-CM | POA: Insufficient documentation

## 2016-04-03 DIAGNOSIS — B379 Candidiasis, unspecified: Secondary | ICD-10-CM | POA: Insufficient documentation

## 2016-04-03 MED ORDER — NYSTATIN 100000 UNIT/ML MT SUSP: 500000.0000 [IU] | Freq: Four times a day (QID) | OROMUCOSAL | Status: DC

## 2016-04-03 NOTE — ED Notes (Signed)
Pt   Reports    2   Symptoms       She  Reports she  Has been  On   Anti biotics       For  A  uti   And  Has  Symptoms    Of  Whitish  Coating  And     A  unpleasant  Sensation  And    Taste  In her  Mouth     X  3  Days       she  Also   Reports  Increase  In  Back  Pain     And     Leg  Pain     X  2  Days  denys  specefic   Recent injury     But  She  Has   Scoliosis   And  Rods  In her  Back  Which  She  Is   Concerned  About  -  She  Is    In   An  Administrator

## 2016-04-03 NOTE — Discharge Instructions (Signed)
Back Pain, Adult °Back pain is very common in adults. The cause of back pain is rarely dangerous and the pain often gets better over time. The cause of your back pain may not be known. Some common causes of back pain include: °· Strain of the muscles or ligaments supporting the spine. °· Wear and tear (degeneration) of the spinal disks. °· Arthritis. °· Direct injury to the back. °For many people, back pain may return. Since back pain is rarely dangerous, most people can learn to manage this condition on their own. °HOME CARE INSTRUCTIONS °Watch your back pain for any changes. The following actions may help to lessen any discomfort you are feeling: °· Remain active. It is stressful on your back to sit or stand in one place for long periods of time. Do not sit, drive, or stand in one place for more than 30 minutes at a time. Take short walks on even surfaces as soon as you are able. Try to increase the length of time you walk each day. °· Exercise regularly as directed by your health care provider. Exercise helps your back heal faster. It also helps avoid future injury by keeping your muscles strong and flexible. °· Do not stay in bed. Resting more than 1-2 days can delay your recovery. °· Pay attention to your body when you bend and lift. The most comfortable positions are those that put less stress on your recovering back. Always use proper lifting techniques, including: °¨ Bending your knees. °¨ Keeping the load close to your body. °¨ Avoiding twisting. °· Find a comfortable position to sleep. Use a firm mattress and lie on your side with your knees slightly bent. If you lie on your back, put a pillow under your knees. °· Avoid feeling anxious or stressed. Stress increases muscle tension and can worsen back pain. It is important to recognize when you are anxious or stressed and learn ways to manage it, such as with exercise. °· Take medicines only as directed by your health care provider. Over-the-counter  medicines to reduce pain and inflammation are often the most helpful. Your health care provider may prescribe muscle relaxant drugs. These medicines help dull your pain so you can more quickly return to your normal activities and healthy exercise. °· Apply ice to the injured area: °¨ Put ice in a plastic bag. °¨ Place a towel between your skin and the bag. °¨ Leave the ice on for 20 minutes, 2-3 times a day for the first 2-3 days. After that, ice and heat may be alternated to reduce pain and spasms. °· Maintain a healthy weight. Excess weight puts extra stress on your back and makes it difficult to maintain good posture. °SEEK MEDICAL CARE IF: °· You have pain that is not relieved with rest or medicine. °· You have increasing pain going down into the legs or buttocks. °· You have pain that does not improve in one week. °· You have night pain. °· You lose weight. °· You have a fever or chills. °SEEK IMMEDIATE MEDICAL CARE IF:  °· You develop new bowel or bladder control problems. °· You have unusual weakness or numbness in your arms or legs. °· You develop nausea or vomiting. °· You develop abdominal pain. °· You feel faint. °  °This information is not intended to replace advice given to you by your health care provider. Make sure you discuss any questions you have with your health care provider. °  °Document Released: 10/19/2005 Document Revised: 11/09/2014 Document Reviewed: 02/20/2014 °Elsevier Interactive Patient Education ©2016 [employer] Ashlee [name], Adult  Ashlee [name] is an infection that can happen on the mouth, throat, tongue, or other areas. It causes white patches to form on the mouth and tongue. HOME CARE  Only take medicine as told by your doctor. You may be given medicine to swallow or to apply right on the area.  Eat plain yogurt that contains live cultures (check the label).  Rinse your mouth many times a day with a warm saltwater rinse. To make the rinse, mix 1 teaspoon (6 g) of salt in 8 ounces  (0.2 L) of warm water. To reduce pain:  Drink cold liquids such as water or iced tea.  Eat frozen ice pops or frozen juices.  Eat foods that are easy to swallow, such as gelatin or ice cream.  Drink from a straw if the patches are painful. If you are breastfeeding:  Clean your nipples with an antifungal medicine.  Dry your nipples after breastfeeding.  Use an ointment called lanolin to help relieve nipple soreness. If you wear dentures:  Take out your dentures before going to bed.  Brush them thoroughly.  Soak them in a denture cleaner. GET HELP IF:   Your problems are getting worse.  Your problems are not improving within 7 days of starting treatment.  Your infection is spreading. This may show as white patches on the skin outside of your mouth.  You are nursing and have redness and pain in the nipples. MAKE SURE YOU:  Understand these instructions.  Will watch your condition.  Will get help right away if you are not doing well or get worse.   This information is not intended to replace advice given to you by your health care provider. Make sure you discuss any questions you have with your health care provider.   Document Released: 01/13/2010 Document Revised: 08/09/2013 Document Reviewed: 05/22/2013 Elsevier Interactive Patient Education [employer]

## 2016-04-03 NOTE — ED Provider Notes (Signed)
CSN: [id number]     Arrival date & time 04/03/16  1619 History   First MD Initiated Contact with Patient 04/03/16 1638     Chief Complaint  Patient presents with  . Back Pain   (Consider location/radiation/quality/duration/timing/severity/associated sxs/prior Treatment) HPI History obtained from patient:  Pt presents with the cc of:  2 complaints #1 oral thrush #2 broken rod and back Duration of symptoms: Thrush symptoms started a couple of days ago possible rod fracture patient states she's noticed that her curvature is high riding more so than usual. Treatment prior to arrival: Oral thrush she is use homeopathic treatment without relief. Context: Oral thrush patient states that she was given antibiotics for a UTI she is unsure why she may have broken a rod in her back she just states that it is happened before Other symptoms include: New pain in her back and legs Pain score: 4 FAMILY HISTORY: No family history of cancer    Past Medical History  Diagnosis Date  . Scoliosis   . Rheumatoid arthritis(714.0)   . Mitral valve prolapse   . Patent foramen ovale   . Back pain   . Restless leg syndrome   . Spinal cord disease (HCC)   . Sciatica    Past Surgical History  Procedure Laterality Date  . Back surgery    . Cardiac catheterization     History reviewed. No pertinent family history. Social History  Substance Use Topics  . Smoking status: Never Smoker   . Smokeless tobacco: None  . Alcohol Use: No   OB History    No data available     Review of Systems  Denies: HEADACHE, NAUSEA, ABDOMINAL PAIN, CHEST PAIN, CONGESTION, DYSURIA, SHORTNESS OF BREATH  Allergies  Food and Tape  Home Medications   Prior to Admission medications   Medication Sig Start Date End Date Taking? Authorizing Provider  acetaminophen (TYLENOL) 325 MG tablet Take 325 mg by mouth every 6 (six) hours as needed for pain.     Historical Provider, MD  amphetamine-dextroamphetamine (ADDERALL XR) 25 MG  24 hr capsule Take 25 mg by mouth every morning.    Historical Provider, MD  amphetamine-dextroamphetamine (ADDERALL) 10 MG tablet Take 10 mg by mouth every evening.     Historical Provider, MD  aspirin 81 MG chewable tablet Chew 81 mg by mouth daily.    Historical Provider, MD  baclofen (LIORESAL) 10 MG tablet Take 30-40 mg by mouth 3 (three) times daily. 4 tablet in the morning 3 tablets in the afternoon 4 tablets at bedtime    Historical Provider, MD  cephALEXin (KEFLEX) 500 MG capsule Take 1 capsule (500 mg total) by mouth 4 (four) times daily. Patient not taking: Reported on 02/05/2015 06/18/14   Zadie Rhine, MD  ferrous sulfate 325 (65 FE) MG tablet Take 1 tablet by mouth daily.    Historical Provider, MD  folic acid (FOLVITE) 1 MG tablet Take 1 mg by mouth daily. 12/11/14   Historical Provider, MD  gabapentin (NEURONTIN) 300 MG capsule Take 900 mg by mouth 3 (three) times daily. AM: 600, 300, pm 900    Historical Provider, MD  HYDROmorphone (DILAUDID) 2 MG tablet Take 2 mg by mouth 2 (two) times daily as needed for moderate pain or severe pain.    Historical Provider, MD  HYDROmorphone (DILAUDID) 4 MG tablet Take 2 mg by mouth 3 (three) times daily as needed. For pain    Historical Provider, MD  hydroxychloroquine (PLAQUENIL) 200 MG tablet Take  200 mg by mouth 2 (two) times daily.     Historical Provider, MD  methotrexate (RHEUMATREX) 2.5 MG tablet Take 10 mg by mouth once a week. Tuesday Caution:Chemotherapy. Protect from light.    Historical Provider, MD  naproxen (NAPROSYN) 500 MG tablet Take 500 mg by mouth 2 (two) times daily.     Historical Provider, MD  pregabalin (LYRICA) 100 MG capsule Take 100 mg by mouth 3 (three) times daily.    Historical Provider, MD  SUMAtriptan (IMITREX) 25 MG tablet Take 25 mg by mouth daily as needed for migraine. For migraine    Historical Provider, MD  tiZANidine (ZANAFLEX) 4 MG tablet Take 4 mg by mouth 3 (three) times daily.    Historical Provider, MD   traMADol (ULTRAM) 50 MG tablet Take 50-100 mg by mouth 3 (three) times daily as needed for moderate pain.     Historical Provider, MD  traMADol (ULTRAM-ER) 100 MG 24 hr tablet Take 100 mg by mouth 3 (three) times daily.    Historical Provider, MD   Meds Ordered and Administered this Visit  Medications - No data to display  BP 117/75 mmHg  Pulse 82  Temp(Src) 98.8 F (37.1 C) (Oral)  Resp 16  SpO2 98% No data found.   Physical Exam NURSES NOTES AND VITAL SIGNS REVIEWED. CONSTITUTIONAL: Well developed, well nourished, no acute distress HEENT: normocephalic, atraumatic, no actual thrush lesions noted in the mouth but there is a clear coating on the tongue EYES: Conjunctiva normal NECK:normal ROM, supple, no adenopathy PULMONARY:No respiratory distress, normal effort ABDOMINAL: Soft, ND, NT BS+, No CVAT MUSCULOSKELETAL: Normal ROM of all extremities, back exam deferred. SKIN: warm and dry without rash PSYCHIATRIC: Mood and affect, behavior are normal  ED Course  Procedures (including critical care time)  Labs Review Labs Reviewed - No data to display  Imaging Review Dg Thoracic Spine 2 View  04/03/2016  CLINICAL DATA:  Pain following fall. Chronic scoliosis with rod fixation EXAM: THORACIC SPINE 2 VIEWS COMPARISON:  Thoracic spine CT September 11, 2015 FINDINGS: Frontal and lateral views were obtained. There is rod fixation in the lower thoracic and visualized lumbar regions. No right fracture or pedicle screw fracture is demonstrated on this study. Marked scoliosis is again noted with lower thoracic dextroscoliosis and upper lumbar levoscoliosis with rotatory components in these areas. No acute fracture or spondylolisthesis. The disc spaces appear stable and unremarkable. Nonunion at T11-12 is better seen by CT. No paraspinous lesions are evident. IMPRESSION: The support hardware appears intact without demonstrable fracture by radiography. Marked scoliosis remains. No acute fracture or  spondylolisthesis. No paraspinous lesions evident. Electronically Signed   By: Bretta Bang III M.D.   On: 04/03/2016 18:07   Dg Lumbar Spine Complete  04/03/2016  CLINICAL DATA:  Pain following fall EXAM: LUMBAR SPINE - COMPLETE 4+ VIEW COMPARISON:  Lumbar spine CT September 25, 2013 FINDINGS: Frontal, lateral, spot lumbosacral lateral, and bilateral oblique views were obtained. Marked lumbar levoscoliosis remains. There is extensive screw and rod fixation. The support hardware appears intact without appreciable rod or screw fracture. There is no acute fracture or spondylolisthesis. Extensive bony overgrowth is noted, consistent with previous surgery. No erosive change or bony destruction is evident. IMPRESSION: Support hardware appears intact without evidence of fracture. Marked scoliosis with areas of bony hypertrophy. No acute bony fracture or spondylolisthesis evident. Electronically Signed   By: Bretta Bang III M.D.   On: 04/03/2016 18:09   Reviewed his part of the medical decision  making  Visual Acuity Review  Right Eye Distance:   Left Eye Distance:   Bilateral Distance:    Right Eye Near:   Left Eye Near:    Bilateral Near:      Patient's back problem as a long-term issue that will need to be addressed by her orthopedist in [place]. As far as the thrush will treat with nystatin and she can follow up with her primary care provider. Low risk for acute deterioration but this is a long-term issue mostly with her back for recovery from the thrush as expected.   MDM   1. Thrush   2. Pain     Patient is reassured that there are no issues that require transfer to higher level of care at this time or additional tests. Patient is advised to continue home symptomatic treatment. Patient is advised that if there are new or worsening symptoms to attend the emergency department, contact primary care provider, or return to [facility]. Instructions of care provided discharged home in stable  condition.    THIS NOTE WAS GENERATED USING A VOICE RECOGNITION SOFTWARE PROGRAM. ALL REASONABLE EFFORTS  WERE MADE TO PROOFREAD THIS DOCUMENT FOR ACCURACY.  I have verbally reviewed the discharge instructions with the patient. A printed AVS was given to the patient.  All questions were answered prior to discharge.      Tharon Aquas, PA 04/03/16 1921

## 2016-11-09 ENCOUNTER — Encounter: Payer: Self-pay | Admitting: Physical Therapy

## 2016-11-09 ENCOUNTER — Ambulatory Visit: Payer: Medicare Other | Attending: *Deleted | Admitting: Physical Therapy

## 2016-11-09 DIAGNOSIS — M25651 Stiffness of right hip, not elsewhere classified: Secondary | ICD-10-CM | POA: Insufficient documentation

## 2016-11-09 DIAGNOSIS — M546 Pain in thoracic spine: Secondary | ICD-10-CM | POA: Insufficient documentation

## 2016-11-09 DIAGNOSIS — M6281 Muscle weakness (generalized): Secondary | ICD-10-CM | POA: Insufficient documentation

## 2016-11-09 DIAGNOSIS — M545 Low back pain: Secondary | ICD-10-CM | POA: Insufficient documentation

## 2016-11-09 DIAGNOSIS — R2689 Other abnormalities of gait and mobility: Secondary | ICD-10-CM | POA: Insufficient documentation

## 2016-11-09 DIAGNOSIS — M25652 Stiffness of left hip, not elsewhere classified: Secondary | ICD-10-CM | POA: Insufficient documentation

## 2016-11-09 DIAGNOSIS — R293 Abnormal posture: Secondary | ICD-10-CM | POA: Insufficient documentation

## 2016-11-09 DIAGNOSIS — G8929 Other chronic pain: Secondary | ICD-10-CM | POA: Insufficient documentation

## 2016-11-09 NOTE — Therapy (Signed)
[facility] [address] [place], [place], [address] Phone: [phone]   Fax:  [phone]  Physical Therapy Evaluation  Patient Details  Name: Ashlee [name] MRN: [id number] Date of Birth: 12/16/1985 Referring Provider: Desma Maxim MD  Encounter Date: 11/09/2016      Ashlee [name] End of Session - 11/09/16 1437    Visit Number 1   Number of Visits 24   Date for Ashlee [name] Re-Evaluation 01/04/17   Authorization Type Medicare: Kx mod by 15th visit, progress note by 10th visit.    Ashlee [name] Start Time 1330   Ashlee [name] Stop Time 1430  additonal time needed for evaluation   Ashlee [name] Time Calculation (min) 60 min   Activity Tolerance Patient tolerated treatment well   Behavior During Therapy WFL for tasks assessed/performed      Past Medical History:  Diagnosis Date  . Allergy    some nuts, juniper bushes  . Anxiety   . Back pain   . GERD (gastroesophageal reflux disease)   . Migraines   . Mitral valve prolapse   . Patent foramen ovale   . Restless leg syndrome   . Rheumatoid arthritis(714.0)   . Sciatica   . Scoliosis   . Spinal cord disease [facility])     Past Surgical History:  Procedure Laterality Date  . BACK SURGERY    . CARDIAC CATHETERIZATION      There were no vitals filed for this visit.       Subjective Assessment - 11/09/16 1346    Subjective Ashlee [name] is a 30 y.o F with CC of chronic thoracic/ lumbar spine pain that started since 2000 and had a SCI in 2011, with referral down bil LE to bil feet reported as pain and burning. uses a rollator/ walking around the house and can walk short distance with SPC. arrived today with a scooter. Intenvise hx of scoliosis with mulitple surgeries / replacements of harrington rods.    Pertinent History SCI 2012   Limitations Sitting;Standing;Lifting;Walking;House hold activities   How long can you sit comfortably? unlimited   How long can you stand comfortably? 10-15 min   How long can you walk  comfortably? 10 min   Diagnostic tests x-ray   Patient Stated Goals stretching out tight muscles in the LE, controlling pain,    Currently in Pain? Yes   Pain Score 5   took medication before 1pm   Pain Location Back   Pain Orientation Right;Left;Mid;Lower   Pain Descriptors / Indicators Burning;Aching;Sharp   Pain Type Chronic pain   Pain Radiating Towards bil LE to the feet   Pain Onset More than a month ago   Pain Frequency Constant  with fluctuating intensity   Aggravating Factors  Cold weather, activity, standing, walking,    Pain Relieving Factors sitting, laying down, medication, heat,             [facility] Ashlee [name] Assessment - 11/09/16 1358      Assessment   Medical Diagnosis Scoliosis, thoracic pain and lumbar pain with sciatica   Referring Provider Desma Maxim MD   Onset Date/Surgical Date --  since 2000   Hand Dominance Right   Next MD Visit 12/01/2016   Prior Therapy yes     Precautions   Precautions Back   Precaution Comments no bending, lifting, twisting. avoid lifting over 15-20#     Balance Screen   Has the patient fallen in the past 6 months Yes   How many times? 4  Has the patient had a decrease in activity level because of a fear of falling?  No   Is the patient reluctant to leave their home because of a fear of falling?  No     Home Environment   Living Environment Private residence   Living Arrangements Alone   Type of Home Apartment   Home Access Level entry   Home Layout One level   Copywriter, advertising - 2 wheels;[place] - single point     Prior Function   Level of Independence Independent with household mobility with device;Independent with community mobility with device   Vocation On disability     Cognition   Overall Cognitive Status Within Functional Limits for tasks assessed     Observation/Other Assessments   Focus on Therapeutic Outcomes (FOTO)  not take at evaluation     Posture/Postural Control    Posture/Postural Control Postural limitations   Postural Limitations Rounded Shoulders;Forward head;Decreased lumbar lordosis  thoracolumbar scoliosis     ROM / Strength   AROM / PROM / Strength PROM;AROM;Strength     AROM   AROM Assessment Site Lumbar   Right/Left Hip Right;Left   Right Hip Extension 8   Right Hip Flexion 92   Right Hip ABduction 11   Left Hip Extension 5   Left Hip Flexion 74   Left Hip ABduction 10   Lumbar Flexion 10   Lumbar Extension not assessed    Lumbar - Right Side Bend 4   Lumbar - Left Side Bend 5     PROM   Right/Left Hip Right;Left   Left Hip Flexion 58   Left Hip ABduction 12     Strength   Strength Assessment Site Hip;Knee   Right/Left Hip Right;Left   Right Hip Flexion 3-/5   Right Hip Extension 3-/5   Right Hip ABduction 3+/5   Right Hip ADduction 3+/5   Left Hip Flexion 3/5   Left Hip Extension 3-/5   Left Hip ABduction 3+/5   Left Hip ADduction 3+/5   Right/Left Knee Right;Left   Right Knee Flexion 4-/5   Right Knee Extension 4/5   Left Knee Flexion 4+/5   Left Knee Extension 4+/5     Right Hip   Right Hip ABduction 13     Flexibility   Soft Tissue Assessment /Muscle Length yes   Hamstrings R 65 degrees and L 58 degrees with SLR     Palpation   Palpation comment spasm noted in bil thoracolumbar region, tightness noted in bil hip abductors, adductors and hamstrings      Special Tests    Special Tests Hip Special Tests   Hip Special Tests  [facility] Test    Findings Positive   Side --  bil   Comments 8 degrees flexion on the L, 10 degrees flexion on the R.     Transfers   Comments stand using SPC, sit and stand from table using back of legs against table                           Ashlee [name] Education - 11/09/16 1435    Education provided Yes   Education Details evaluation findings, POC, goals, HEP with proper form and treatment rationale.    Person(s) Educated Patient   Methods  Explanation;Verbal cues   Comprehension Verbalized understanding;Verbal cues required          Ashlee [name] Short Term Goals -  11/09/16 1448      Ashlee [name] SHORT TERM GOAL #1   Title Ashlee [name] will be I with inital HEP given (12/10/2016)   Time 4   Period Weeks   Status New     Ashlee [name] SHORT TERM GOAL #2   Title Ashlee [name] will increase bil hip strength to >/=4-/5 with </=5/10 pain to promote functional strength (12/10/2016)   Time 4   Period Weeks   Status New     Ashlee [name] SHORT TERM GOAL #3   Title Ashlee [name] will be able to tolerate standing for >/= 10 minutes with </= 5/10 pain in the low back and bil LE's to promote function and ADLS (12/10/2016)   Time 4   Period Weeks   Status New     Ashlee [name] SHORT TERM GOAL #4   Title Ashlee [name] will demonstrate decreased muscle spasm to promote hamstring flexibility by >/= 5 degrees on the L to promote increased mobility  (12/10/2016)   Time 4   Period Weeks   Status New           Ashlee [name] Long Term Goals - 11/09/16 1454      Ashlee [name] LONG TERM GOAL #1   Title Ashlee [name] will be I with advanced HEP (01/04/17)   Time 8   Period Weeks   Status New     Ashlee [name] LONG TERM GOAL #2   Title Ashlee [name] will increase bil hip strength to >/= 4/5 with </= 2/10 pain to promote strength required for ADLS and prolonged walking / standing activities (01/04/17)   Time 8   Period Weeks   Status New     Ashlee [name] LONG TERM GOAL #3   Title Ashlee [name] will increase L hamstring flexibility to >/= 65 degrees to assist with prolonged standing and walking with LRAD with </= 2/10 pain (01/04/17)   Baseline *   Time 8   Period Weeks   Status New     Ashlee [name] LONG TERM GOAL #4   Title Ashlee [name] will will be able to walk/ stand for >/=15 minutes with LRAD to promote functional mobility and endurance required for community ambulation and ADLs (01/04/17)   Baseline *   Time 8   Period Weeks   Status New     Ashlee [name] LONG TERM GOAL #5   Title Ashlee [name] will increaese bil hip flexibility in thomas test position to </= 3 degrees of hip flexion bil to demonstrate reduced hip flexor  tightness (01/04/17)   Baseline *   Time 8   Period Weeks   Status New               Plan - 11/09/16 1441    Clinical Impression Statement Ashlee [name] presents to OPPT as high complexity evaluation based on involved PMHx, worsening pain that fluctuates with LE referral and evlautions findings of CC oflow back pain. Ashlee [name] demonstrate significant trunk and hip mobility secondary to pain and muscle tension. weakness in bil hip / knee strengthening. tightness in bil thoracolumbar paraspinals and tightness in bil hip musculature. Ashlee [name] would benefit from physical therapy to increased hip strength/ mobility, reduce pain and muscle tightness and maximize her function by addressing the deficits listed.    Rehab Potential Good   Ashlee [name] Frequency 3x / week   Ashlee [name] Duration 8 weeks   Ashlee [name] Treatment/Interventions ADLs/Self Care Home Management;Cryotherapy;Iontophoresis 4mg /ml Dexamethasone;Electrical Stimulation;Moist Heat;Ultrasound;Gait training;Therapeutic activities;Therapeutic exercise;Patient/family education;Dry needling;Taping;Manual techniques;Balance training   Ashlee [name] Next Visit Plan assess response to HEP, modify stretching PRN, hip stregthening, DN, modalities  Ashlee [name] Home Exercise Plan glute med / piriformis/ hip flexor stretching, clamshells, SLR,    Consulted and Agree with Plan of Care Patient      Patient will benefit from skilled therapeutic intervention in order to improve the following deficits and impairments:  Pain, Improper body mechanics, Postural dysfunction, Impaired flexibility, Decreased strength, Decreased mobility, Impaired tone, Increased muscle spasms, Increased fascial restricitons, Abnormal gait, Decreased activity tolerance, Decreased endurance, Decreased balance, Difficulty walking, Decreased range of motion  Visit Diagnosis: Chronic bilateral low back pain, with sciatica presence unspecified - Plan: Ashlee [name] plan of care cert/re-cert  Muscle weakness (generalized) - Plan: Ashlee [name] plan of care  cert/re-cert  Abnormal posture - Plan: Ashlee [name] plan of care cert/re-cert  Other abnormalities of gait and mobility - Plan: Ashlee [name] plan of care cert/re-cert  Stiffness of left hip, not elsewhere classified - Plan: Ashlee [name] plan of care cert/re-cert  Stiffness of right hip, not elsewhere classified - Plan: Ashlee [name] plan of care cert/re-cert  Pain in thoracic spine - Plan: Ashlee [name] plan of care cert/re-cert      G-Codes - 12-07-16 1517    Functional Assessment Tool Used clinical judgement   Functional Limitation Mobility: Walking and moving around   Mobility: Walking and Moving Around Current Status (G2952) At least 60 percent but less than 80 percent impaired, limited or restricted   Mobility: Walking and Moving Around Goal Status (W4132) At least 40 percent but less than 60 percent impaired, limited or restricted       Problem List There are no active problems to display for this patient.  Ashlee [name] Ashlee [name], Ashlee [name], Ashlee [name], Ashlee [name]  December 07, 2016  3:18 PM      [facility] [address] [place], [place], [address] Phone: [phone]   Fax:  [phone]  Name: JARIELYS GIRARDOT MRN: [id number] Date of Birth: September 10, 1986

## 2016-11-13 ENCOUNTER — Ambulatory Visit: Payer: Medicare Other | Admitting: Physical Therapy

## 2016-11-13 DIAGNOSIS — M546 Pain in thoracic spine: Secondary | ICD-10-CM

## 2016-11-13 DIAGNOSIS — R293 Abnormal posture: Secondary | ICD-10-CM

## 2016-11-13 DIAGNOSIS — R2689 Other abnormalities of gait and mobility: Secondary | ICD-10-CM

## 2016-11-13 DIAGNOSIS — M545 Low back pain: Secondary | ICD-10-CM | POA: Diagnosis not present

## 2016-11-13 DIAGNOSIS — M6281 Muscle weakness (generalized): Secondary | ICD-10-CM

## 2016-11-13 DIAGNOSIS — M25652 Stiffness of left hip, not elsewhere classified: Secondary | ICD-10-CM

## 2016-11-13 DIAGNOSIS — M25651 Stiffness of right hip, not elsewhere classified: Secondary | ICD-10-CM

## 2016-11-13 DIAGNOSIS — G8929 Other chronic pain: Secondary | ICD-10-CM

## 2016-11-13 NOTE — Therapy (Signed)
[facility] [address] [place], [place], [address] Phone: [phone]   Fax:  [phone]  Physical Therapy Treatment  Ashlee [name] Details  Name: Ashlee [name] MRN: [id number] Date of Birth: 03-20-1986 Referring Provider: Desma Maxim MD  Encounter Date: 11/13/2016      Ashlee [name] End of Session - 11/13/16 0930    Visit Number 2   Number of Visits 24   Date for Ashlee [name] Re-Evaluation 01/04/17   Ashlee [name] Start Time 0845   Ashlee [name] Stop Time 0936   Ashlee [name] Time Calculation (min) 51 min   Activity Tolerance Ashlee [name] tolerated treatment well   Behavior During Therapy [facility] for tasks assessed/performed      Past Medical History:  Diagnosis Date  . Allergy    some nuts, juniper bushes  . Anxiety   . Back pain   . GERD (gastroesophageal reflux disease)   . Migraines   . Mitral valve prolapse   . Patent foramen ovale   . Restless leg syndrome   . Rheumatoid arthritis(714.0)   . Sciatica   . Scoliosis   . Spinal cord disease [facility])     Past Surgical History:  Procedure Laterality Date  . BACK SURGERY    . CARDIAC CATHETERIZATION      There were no vitals filed for this visit.      Subjective Assessment - 11/13/16 0846    Subjective "I got my imaging back from the MD and they report I have broken one of the rods." this week has been rough so I haven't been as consistent with the HEP.   Currently in Pain? Yes   Pain Score 5    Pain Location Back   Pain Orientation Right;Left;Mid;Lower                         [facility] Adult Ashlee [name] Treatment/Exercise - 11/13/16 0908      Lumbar Exercises: Stretches   Passive Hamstring Stretch 3 reps;30 seconds  both sides   Single Knee to Chest Stretch 3 reps;30 seconds  passive, both sides   Piriformis Stretch 3 reps;30 seconds  Passive, both sides     Knee/Hip Exercises: Stretches   Other Knee/Hip Stretches bil hip adductor stretc 3 x 30 sec hold     Knee/Hip Exercises: Supine   Bridges  AROM;15 reps;Strengthening;2 sets   Other Supine Knee/Hip Exercises clamshells 2 x 15 with green theraband      Manual Therapy   Manual Therapy Soft tissue mobilization;Myofascial release   Soft tissue mobilization manual trigger point compression of R and L piriformis and L paraspinals with tennis ball, IASTM of L paraspinals with tennis ball   Myofascial Release DTM over L lumbar paraspinals                Ashlee [name] Education - 11/13/16 0929    Education provided Yes   Education Details reviewed previously provided HEP   Person(s) Educated Ashlee [name]   Methods Explanation;Verbal cues;Handout   Comprehension Verbalized understanding;Verbal cues required          Ashlee [name] Short Term Goals - 11/09/16 1448      Ashlee [name] SHORT TERM GOAL #1   Title Ashlee [name] will be I with inital HEP given (12/10/2016)   Time 4   Period Weeks   Status New     Ashlee [name] SHORT TERM GOAL #2   Title Ashlee [name] will increase bil hip strength to >/=4-/5 with </=5/10 pain to promote functional strength (12/10/2016)  Time 4   Period Weeks   Status New     Ashlee [name] SHORT TERM GOAL #3   Title Ashlee [name] will be able to tolerate standing for >/= 10 minutes with </= 5/10 pain in the low back and bil LE's to promote function and ADLS (12/10/2016)   Time 4   Period Weeks   Status New     Ashlee [name] SHORT TERM GOAL #4   Title Ashlee [name] will demonstrate decreased muscle spasm to promote hamstring flexibility by >/= 5 degrees on the L to promote increased mobility  (12/10/2016)   Time 4   Period Weeks   Status New           Ashlee [name] Long Term Goals - 11/09/16 1454      Ashlee [name] LONG TERM GOAL #1   Title Ashlee [name] will be I with advanced HEP (01/04/17)   Time 8   Period Weeks   Status New     Ashlee [name] LONG TERM GOAL #2   Title Ashlee [name] will increase bil hip strength to >/= 4/5 with </= 2/10 pain to promote strength required for ADLS and prolonged walking / standing activities (01/04/17)   Time 8   Period Weeks   Status New     Ashlee [name] LONG TERM GOAL #3   Title Ashlee [name] will increase L hamstring  flexibility to >/= 65 degrees to assist with prolonged standing and walking with LRAD with </= 2/10 pain (01/04/17)   Baseline *   Time 8   Period Weeks   Status New     Ashlee [name] LONG TERM GOAL #4   Title Ashlee [name] will will be able to walk/ stand for >/=15 minutes with LRAD to promote functional mobility and endurance required for community ambulation and ADLs (01/04/17)   Baseline *   Time 8   Period Weeks   Status New     Ashlee [name] LONG TERM GOAL #5   Title Ashlee [name] will increaese bil hip flexibility in thomas test position to </= 3 degrees of hip flexion bil to demonstrate reduced hip flexor tightness (01/04/17)   Baseline *   Time 8   Period Weeks   Status New               Plan - 11/13/16 0930    Clinical Impression Statement Ashlee [name] reports consistency with her HEP when Ashlee [name] can do it. reviewed HEP and worked on hip stretching and bil hip strengthening. Following soft tissue work Ashlee [name] reported relief of tightness/ soreness. utilized MHP to calm down soreness post session.    Ashlee [name] Next Visit Plan updated HEP PRN, modify stretching PRN, hip stregthening, DN, modalities    Ashlee [name] Home Exercise Plan glute med / piriformis/ hip flexor stretching, clamshells, SLR,    Consulted and Agree with Plan of Care Ashlee [name]      Ashlee [name] will benefit from skilled therapeutic intervention in order to improve the following deficits and impairments:  Pain, Improper body mechanics, Postural dysfunction, Impaired flexibility, Decreased strength, Decreased mobility, Impaired tone, Increased muscle spasms, Increased fascial restricitons, Abnormal gait, Decreased activity tolerance, Decreased endurance, Decreased balance, Difficulty walking, Decreased range of motion  Visit Diagnosis: Chronic bilateral low back pain, with sciatica presence unspecified  Muscle weakness (generalized)  Abnormal posture  Other abnormalities of gait and mobility  Stiffness of left hip, not elsewhere classified  Stiffness of right hip, not  elsewhere classified  Pain in thoracic spine     Problem List There are no active problems to display for this Ashlee [name].  Lulu Riding Ashlee [name], DPT, LAT, ATC  11/13/16  9:33 AM      [facility] [address] [place], [place], [address] Phone: [phone]   Fax:  [phone]  Name: RONNE SAVOIA MRN: [id number] Date of Birth: [birth year]

## 2016-11-16 ENCOUNTER — Ambulatory Visit: Payer: Medicare Other | Admitting: Physical Therapy

## 2016-11-16 ENCOUNTER — Ambulatory Visit (INDEPENDENT_AMBULATORY_CARE_PROVIDER_SITE_OTHER): Payer: Medicare Other

## 2016-11-16 ENCOUNTER — Encounter (HOSPITAL_COMMUNITY): Payer: Self-pay | Admitting: Emergency Medicine

## 2016-11-16 ENCOUNTER — Ambulatory Visit (HOSPITAL_COMMUNITY)
Admission: EM | Admit: 2016-11-16 | Discharge: 2016-11-16 | Disposition: A | Discharge status: Home / Self Care | Payer: Medicare Other | Attending: Family Medicine | Admitting: Family Medicine

## 2016-11-16 DIAGNOSIS — M5440 Lumbago with sciatica, unspecified side: Secondary | ICD-10-CM

## 2016-11-16 DIAGNOSIS — S39012A Strain of muscle, fascia and tendon of lower back, initial encounter: Secondary | ICD-10-CM | POA: Diagnosis not present

## 2016-11-16 DIAGNOSIS — R293 Abnormal posture: Secondary | ICD-10-CM

## 2016-11-16 DIAGNOSIS — M6281 Muscle weakness (generalized): Secondary | ICD-10-CM

## 2016-11-16 DIAGNOSIS — M25651 Stiffness of right hip, not elsewhere classified: Secondary | ICD-10-CM

## 2016-11-16 DIAGNOSIS — G8929 Other chronic pain: Secondary | ICD-10-CM | POA: Diagnosis not present

## 2016-11-16 DIAGNOSIS — M545 Low back pain: Secondary | ICD-10-CM | POA: Diagnosis not present

## 2016-11-16 DIAGNOSIS — R2689 Other abnormalities of gait and mobility: Secondary | ICD-10-CM

## 2016-11-16 DIAGNOSIS — M25652 Stiffness of left hip, not elsewhere classified: Secondary | ICD-10-CM

## 2016-11-16 DIAGNOSIS — M546 Pain in thoracic spine: Secondary | ICD-10-CM

## 2016-11-16 MED ORDER — CYCLOBENZAPRINE HCL 5 MG PO TABS: 5.0000 mg | ORAL_TABLET | Freq: Three times a day (TID) | ORAL | 0 refills | Status: DC
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
## 2016-11-16 NOTE — ED Triage Notes (Signed)
Burning in lower back and into both legs since last night .

## 2016-11-16 NOTE — Discharge Instructions (Signed)
Heat and medicine as needed. See your back doctor if needed
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
## 2016-11-16 NOTE — ED Triage Notes (Signed)
Patient wants to make sure no damage occurred to hard ware in back after incident last night involving twisting of back

## 2016-11-18 ENCOUNTER — Ambulatory Visit: Payer: Medicare Other | Admitting: Physical Therapy

## 2016-11-19 ENCOUNTER — Ambulatory Visit: Payer: Medicare Other | Admitting: Physical Therapy

## 2016-11-23 ENCOUNTER — Ambulatory Visit: Payer: Medicare Other | Admitting: Physical Therapy

## 2016-11-23 DIAGNOSIS — M545 Low back pain: Secondary | ICD-10-CM | POA: Diagnosis not present

## 2016-11-23 DIAGNOSIS — M546 Pain in thoracic spine: Secondary | ICD-10-CM

## 2016-11-23 DIAGNOSIS — R293 Abnormal posture: Secondary | ICD-10-CM

## 2016-11-23 DIAGNOSIS — R2689 Other abnormalities of gait and mobility: Secondary | ICD-10-CM

## 2016-11-23 DIAGNOSIS — M25652 Stiffness of left hip, not elsewhere classified: Secondary | ICD-10-CM

## 2016-11-23 DIAGNOSIS — G8929 Other chronic pain: Secondary | ICD-10-CM

## 2016-11-23 DIAGNOSIS — M6281 Muscle weakness (generalized): Secondary | ICD-10-CM

## 2016-11-23 DIAGNOSIS — M25651 Stiffness of right hip, not elsewhere classified: Secondary | ICD-10-CM

## 2016-11-23 NOTE — Therapy (Signed)
[facility] [address] [place], [place], [address] Phone: [phone]   Fax:  [phone]  Physical Therapy Treatment  Patient Details  Name: Ashlee [name] MRN: [id number] Date of Birth: 24-Oct-1986 Referring Provider: Desma Maxim MD  Encounter Date: 11/23/2016    Past Medical History:  Diagnosis Date  . Allergy    some nuts, juniper bushes  . Anxiety   . Back pain   . GERD (gastroesophageal reflux disease)   . Migraines   . Mitral valve prolapse   . Patent foramen ovale   . Restless leg syndrome   . Rheumatoid arthritis(714.0)   . Sciatica   . Scoliosis   . Spinal cord disease [facility])     Past Surgical History:  Procedure Laterality Date  . BACK SURGERY    . CARDIAC CATHETERIZATION      There were no vitals filed for this visit.      Subjective Assessment - 11/23/16 1156    Subjective " I did end up going to urgent care which they said, I have just a strain in the lumbar spine"    Currently in Pain? Yes   Pain Score 6   took a dilaudid   Pain Location Back   Pain Type Chronic pain   Pain Onset More than a month ago   Pain Frequency Constant   Aggravating Factors  cold weather, activity, standing/ prolonged   Pain Relieving Factors sitting, laying down, medication, heat.                          [facility] Adult PT Treatment/Exercise - 11/23/16 1200      Lumbar Exercises: Stretches   Passive Hamstring Stretch 2 reps;30 seconds   Single Knee to Chest Stretch 2 reps;30 seconds   Piriformis Stretch 2 reps;30 seconds     Lumbar Exercises: Aerobic   Stationary Bike Nu-step L 5 x 10 min  UE/LE     Lumbar Exercises: Supine   Other Supine Lumbar Exercises Modified table top with min assist 3 x 10 sec hold     Knee/Hip Exercises: Stretches   Quad Stretch 2 reps;30 seconds;Both   Other Knee/Hip Stretches bil hip adductor stretch 3 x 30 sec hold     Knee/Hip Exercises: Supine   Other  Supine Knee/Hip Exercises clamshells alternating LE 2 x 15 ea.  1 set with red therband, downgraded to yellow   Other Supine Knee/Hip Exercises pelvic tilt 2 x 10 with 3 sec hold  with tactile cues on where to palpate for transvers abd     Modalities   Modalities Moist Heat     Moist Heat Therapy   Number Minutes Moist Heat 10 Minutes   Moist Heat Location Lumbar Spine  with pt in prone     Manual Therapy   Soft tissue mobilization IASTM over the rectus femoris while on stretch                   PT Short Term Goals - 11/09/16 1448      PT SHORT TERM GOAL #1   Title pt will be I with inital HEP given (12/10/2016)   Time 4   Period Weeks   Status New     PT SHORT TERM GOAL #2   Title pt will increase bil hip strength to >/=4-/5 with </=5/10 pain to promote functional strength (12/10/2016)   Time 4   Period Weeks  Status New     PT SHORT TERM GOAL #3   Title pt will be able to tolerate standing for >/= 10 minutes with </= 5/10 pain in the low back and bil LE's to promote function and ADLS (12/10/2016)   Time 4   Period Weeks   Status New     PT SHORT TERM GOAL #4   Title she will demonstrate decreased muscle spasm to promote hamstring flexibility by >/= 5 degrees on the L to promote increased mobility  (12/10/2016)   Time 4   Period Weeks   Status New           PT Long Term Goals - 11/09/16 1454      PT LONG TERM GOAL #1   Title Pt will be I with advanced HEP (01/04/17)   Time 8   Period Weeks   Status New     PT LONG TERM GOAL #2   Title she will increase bil hip strength to >/= 4/5 with </= 2/10 pain to promote strength required for ADLS and prolonged walking / standing activities (01/04/17)   Time 8   Period Weeks   Status New     PT LONG TERM GOAL #3   Title she will increase L hamstring flexibility to >/= 65 degrees to assist with prolonged standing and walking with LRAD with </= 2/10 pain (01/04/17)   Baseline *   Time 8   Period Weeks   Status New      PT LONG TERM GOAL #4   Title She will will be able to walk/ stand for >/=15 minutes with LRAD to promote functional mobility and endurance required for community ambulation and ADLs (01/04/17)   Baseline *   Time 8   Period Weeks   Status New     PT LONG TERM GOAL #5   Title pt will increaese bil hip flexibility in thomas test position to </= 3 degrees of hip flexion bil to demonstrate reduced hip flexor tightness (01/04/17)   Baseline *   Time 8   Period Weeks   Status New               Plan - 11/23/16 1250    Clinical Impression Statement Ashlee [name] reports some improvement in pain and tightness in the LE. continued stretching and controlled strengthening of the hips and core. utilied MHP to calm down soreness/ tightness.    PT Next Visit Plan updated HEP PRN, modify stretching PRN, hip stregthening, DN, modalities, DTM over the hip flexors and quads   Consulted and Agree with Plan of Care Patient      Patient will benefit from skilled therapeutic intervention in order to improve the following deficits and impairments:  Pain, Improper body mechanics, Postural dysfunction, Impaired flexibility, Decreased strength, Decreased mobility, Impaired tone, Increased muscle spasms, Increased fascial restricitons, Abnormal gait, Decreased activity tolerance, Decreased endurance, Decreased balance, Difficulty walking, Decreased range of motion  Visit Diagnosis: Chronic bilateral low back pain, with sciatica presence unspecified  Muscle weakness (generalized)  Abnormal posture  Other abnormalities of gait and mobility  Stiffness of left hip, not elsewhere classified  Stiffness of right hip, not elsewhere classified  Pain in thoracic spine     Problem List There are no active problems to display for this patient.  Ashlee [name] PT, DPT, LAT, ATC  11/23/16  12:55 PM      [name] [address] [place],  [place], [address] Phone:  [phone]   Fax:  [phone]  Name: Ashlee [name] MRN: [id number] Date of Birth: 02/14/1986

## 2016-11-25 ENCOUNTER — Ambulatory Visit: Payer: Medicare Other | Admitting: Physical Therapy

## 2016-11-25 ENCOUNTER — Encounter: Payer: Self-pay | Admitting: Physical Therapy

## 2016-11-25 DIAGNOSIS — M545 Low back pain: Principal | ICD-10-CM

## 2016-11-25 DIAGNOSIS — G8929 Other chronic pain: Secondary | ICD-10-CM

## 2016-11-25 DIAGNOSIS — M25651 Stiffness of right hip, not elsewhere classified: Secondary | ICD-10-CM

## 2016-11-25 DIAGNOSIS — M25652 Stiffness of left hip, not elsewhere classified: Secondary | ICD-10-CM

## 2016-11-25 DIAGNOSIS — R2689 Other abnormalities of gait and mobility: Secondary | ICD-10-CM

## 2016-11-25 DIAGNOSIS — M6281 Muscle weakness (generalized): Secondary | ICD-10-CM

## 2016-11-25 DIAGNOSIS — R293 Abnormal posture: Secondary | ICD-10-CM

## 2016-11-25 NOTE — Patient Instructions (Signed)
Place stretching strap on inside of her heel to avoid twisting knee with quad stretch.

## 2016-11-25 NOTE — Therapy (Signed)
[place], [place], [address] Phone: [phone]   Fax:  [phone]  Physical Therapy Treatment  Patient Details  Name: Ashlee [name] MRN: [id number] Date of Birth: 1986-02-29 Referring Provider: Vivi Barrack MD  Encounter Date: 11/25/2016      PT End of Session - 11/25/16 1752    Visit Number 5   Number of Visits 24   Date for PT Re-Evaluation 01/04/17   PT Start Time 1331   PT Stop Time 1420   PT Time Calculation (min) 49 min   Activity Tolerance Patient tolerated treatment well   Behavior During Therapy [facility] for tasks assessed/performed      Past Medical History:  Diagnosis Date  . Allergy    some nuts, juniper bushes  . Anxiety   . Back pain   . GERD (gastroesophageal reflux disease)   . Migraines   . Mitral valve prolapse   . Patent foramen ovale   . Restless leg syndrome   . Rheumatoid arthritis(714.0)   . Sciatica   . Scoliosis   . Spinal cord disease [facility])     Past Surgical History:  Procedure Laterality Date  . BACK SURGERY    . CARDIAC CATHETERIZATION      There were no vitals filed for this visit.      Subjective Assessment - 11/25/16 1339    Subjective Back is still sore from [employer].  I feel my hip is more mobile.     Currently in Pain? Yes   Pain Score 6    Pain Location Back   Pain Orientation Mid;Lower;Left   Pain Descriptors / Indicators Aching;Sharp   Pain Type Chronic pain   Pain Radiating Towards feet both   Pain Frequency Constant   Aggravating Factors  cold weather, standing and walking, worse at night   Pain Relieving Factors medication, heat   Multiple Pain Sites --  RA pain right elbow 4/10 worse when loaded weight wise                         [facility] Adult PT Treatment/Exercise - 11/25/16 0001      Lumbar Exercises: Aerobic   Stationary Bike Nu-step L 5 x 10 min  UE/LE     Lumbar Exercises: Supine   Ab Set 5 reps    Clam Limitations 15 x single yellow band  2 sets   Bent Knee Raise 10 reps   Large Ball Abdominal Isometric Limitations 10 X both and single arm presses into foam roller   Other Supine Lumbar Exercises 5 X 5 second holds  min assist,  single leg Table tops.     Knee/Hip Exercises: Stretches   Sports administrator 3 reps;30 seconds  both.  cues needed with strap so knee would not twist   Quad Stretch Limitations Instrument assist     Knee/Hip Exercises: Supine   Other Supine Knee/Hip Exercises --  patient notes she uses her back rods> abs to stabilize   Other Supine Knee/Hip Exercises 10 X     Manual Therapy   Soft tissue mobilization IASTM over the rectus femoris while on stretch   right only,  due to lack of time                PT Education - 11/25/16 1752    Education provided Yes   Education Details quad stretch technique   Person(s) Educated Patient   Methods Explanation;Demonstration;Tactile cues;Verbal  cues   Comprehension Verbalized understanding;Returned demonstration          PT Short Term Goals - 11/25/16 1406      PT SHORT TERM GOAL #1   Title pt will be I with inital HEP given (12/10/2016)   Baseline independent with all except with hip flexor stretch   Time 4   Period Weeks   Status Partially Met     PT SHORT TERM GOAL #2   Title pt will increase bil hip strength to >/=4-/5 with </=5/10 pain to promote functional strength (12/10/2016)   Time 4   Period Weeks     PT SHORT TERM GOAL #3   Title pt will be able to tolerate standing for >/= 10 minutes with </= 5/10 pain in the low back and bil LE's to promote function and ADLS (01/03/1961)   Baseline 10-5 minutes.  varies form day to day   Time 4   Period Weeks   Status Partially Met     PT SHORT TERM GOAL #4   Title she will demonstrate decreased muscle spasm to promote hamstring flexibility by >/= 5 degrees on the L to promote increased mobility  (12/10/2016)   Time 4   Period Weeks   Status Unable to  assess           PT Long Term Goals - 11/09/16 1454      PT LONG TERM GOAL #1   Title Pt will be I with advanced HEP (01/04/17)   Time 8   Period Weeks   Status New     PT LONG TERM GOAL #2   Title she will increase bil hip strength to >/= 4/5 with </= 2/10 pain to promote strength required for ADLS and prolonged walking / standing activities (01/04/17)   Time 8   Period Weeks   Status New     PT LONG TERM GOAL #3   Title she will increase L hamstring flexibility to >/= 65 degrees to assist with prolonged standing and walking with LRAD with </= 2/10 pain (01/04/17)   Baseline *   Time 8   Period Weeks   Status New     PT LONG TERM GOAL #4   Title She will will be able to walk/ stand for >/=15 minutes with LRAD to promote functional mobility and endurance required for community ambulation and ADLs (01/04/17)   Baseline *   Time 8   Period Weeks   Status New     PT LONG TERM GOAL #5   Title pt will increaese bil hip flexibility in thomas test position to </= 3 degrees of hip flexion bil to demonstrate reduced hip flexor tightness (01/04/17)   Baseline *   Time 8   Period Weeks   Status New               Plan - 11/25/16 1753    Clinical Impression Statement Stretching and strengthening continued today.  No pain increase noted by patient today.  Hip extension improves with stretching,  she is not yet using this ROM with gait.   No new goals met.   PT Next Visit Plan updated HEP PRN, modify stretching PRN, hip stregthening, DN, modalities, DTM over the hip flexors and quads   PT Home Exercise Plan glute med / piriformis/ hip flexor stretching, clamshells, SLR,    Consulted and Agree with Plan of Care Patient      Patient will benefit from skilled therapeutic intervention in order  to improve the following deficits and impairments:     Visit Diagnosis: Chronic bilateral low back pain, with sciatica presence unspecified  Muscle weakness (generalized)  Abnormal  posture  Other abnormalities of gait and mobility  Stiffness of left hip, not elsewhere classified  Stiffness of right hip, not elsewhere classified     Problem List There are no active problems to display for this patient.   HARRIS,KAREN PTA 11/25/2016, 5:56 PM  [place], [place], [address] Phone: [phone]   Fax:  [phone]  Name: Ashlee [name] MRN: [id number] Date of Birth: [birth year]

## 2016-11-26 ENCOUNTER — Ambulatory Visit: Payer: Medicare Other | Admitting: Physical Therapy

## 2016-11-26 DIAGNOSIS — R293 Abnormal posture: Secondary | ICD-10-CM

## 2016-11-26 DIAGNOSIS — M545 Low back pain: Secondary | ICD-10-CM | POA: Diagnosis not present

## 2016-11-26 DIAGNOSIS — M25652 Stiffness of left hip, not elsewhere classified: Secondary | ICD-10-CM

## 2016-11-26 DIAGNOSIS — M25651 Stiffness of right hip, not elsewhere classified: Secondary | ICD-10-CM

## 2016-11-26 DIAGNOSIS — R2689 Other abnormalities of gait and mobility: Secondary | ICD-10-CM

## 2016-11-26 DIAGNOSIS — M6281 Muscle weakness (generalized): Secondary | ICD-10-CM

## 2016-11-26 DIAGNOSIS — G8929 Other chronic pain: Secondary | ICD-10-CM

## 2016-11-26 NOTE — Patient Instructions (Signed)
Knee High   Holding stable object, raise knee to hip level, then lower knee. Repeat with other knee. Complete __15_ repetitions. Do __2__ sessions per day. Do both legs  ABDUCTION: Standing (Active)   Stand, feet flat. Lift right leg out to side. Use _0__ lbs. Complete __10_ repetitions. Perform __2_ sessions per day. Do both legs    EXTENSION: Standing (Active)  Stand, both feet flat. Draw right leg behind body as far as possible. Use 0___ lbs. Complete 10 repetitions. Perform __2_ sessions per day.  Do both legs Copyright  [employer]. All rights reserved.   Garen Lah, PT Exercise Expert for the Aging Adult  11/26/16 12:23 PM Phone: [phone] Fax: [phone]

## 2016-11-26 NOTE — Therapy (Signed)
[place], [place], [address] Phone: [phone]   Fax:  [phone]  Physical Therapy Treatment  Patient Details  Name: Ashlee [name] MRN: [id number] Date of Birth: 29-Sep-1986 Referring Provider: Vivi Barrack MD  Encounter Date: 11/26/2016      PT End of Session - 11/26/16 1147    Visit Number 6   Number of Visits 24   Date for PT Re-Evaluation 01/04/17   Authorization Type Medicare: Kx mod by 15th visit, progress note by 10th visit.    PT Start Time 1146   PT Stop Time 1230   PT Time Calculation (min) 44 min   Activity Tolerance Patient tolerated treatment well   Behavior During Therapy WFL for tasks assessed/performed      Past Medical History:  Diagnosis Date  . Allergy    some nuts, juniper bushes  . Anxiety   . Back pain   . GERD (gastroesophageal reflux disease)   . Migraines   . Mitral valve prolapse   . Patent foramen ovale   . Restless leg syndrome   . Rheumatoid arthritis(714.0)   . Sciatica   . Scoliosis   . Spinal cord disease [facility])     Past Surgical History:  Procedure Laterality Date  . BACK SURGERY    . CARDIAC CATHETERIZATION      There were no vitals filed for this visit.      Subjective Assessment - 11/26/16 1152    Subjective I feel like my hip more mobile from coming to PT  My hip flexors were really tight.   Pertinent History SCI 2012   Limitations Sitting;Standing;Lifting;Walking;House hold activities   How long can you sit comfortably? unlimited   How long can you stand comfortably? 10-15 min   How long can you walk comfortably? 10 min   Patient Stated Goals stretching out tight muscles in the LE, controlling pain,    Currently in Pain? Yes   Pain Score 4    Pain Location Back   Pain Orientation Mid;Lower;Left   Pain Descriptors / Indicators Aching;Sharp   Pain Type Chronic pain   Pain Onset More than a month ago   Pain Frequency Constant                          [facility] Adult PT Treatment/Exercise - 11/26/16 1156      Lumbar Exercises: Stretches   Passive Hamstring Stretch 2 reps;30 seconds  right elbow irritated Rheumatoid Arthritis   Passive Hamstring Stretch Limitations 2 reps in sitting,  knee does not straighten as well  elbow painfull with RA to use strap   Single Knee to Chest Stretch 2 reps;30 seconds   Piriformis Stretch 2 reps;30 seconds     Lumbar Exercises: Standing   Other Standing Lumbar Exercises Standing 3 way SLR with walker x 15 bil   utilized 2 in step for better clearance. hip ext with pulses     Lumbar Exercises: Supine   Ab Set 5 reps   AB Set Limitations 10 sec hold   Clam Limitations 15 x single yellow band  2 sets   Bent Knee Raise 10 reps   Other Supine Lumbar Exercises Modified table top with min assist 3 x 20 sec hold     Knee/Hip Exercises: Stretches   Quad Stretch 3 reps;30 seconds  both.  cues needed with strap so knee would not twist   Sports administrator Limitations  Instrument assist     Manual Therapy   Manual Therapy Joint mobilization;Soft tissue mobilization   Joint Mobilization joint mobs to bil hips grade 3/4 posterior and with long arm distraction bil    Soft tissue mobilization IASTM over the rectus femoris while on stretch     due to lack of time bil knees                PT Education - 11/26/16 1223    Education provided Yes   Education Details added standing 3 way SLR with walker /counter to [employer]) Educated Patient   Methods Explanation;Handout;Verbal cues;Tactile cues;Demonstration   Comprehension Verbalized understanding;Returned demonstration          PT Short Term Goals - 11/25/16 1406      PT SHORT TERM GOAL #1   Title pt will be I with inital HEP given (12/10/2016)   Baseline independent with all except with hip flexor stretch   Time 4   Period Weeks   Status Partially Met     PT SHORT TERM GOAL #2   Title pt will increase  bil hip strength to >/=4-/5 with </=5/10 pain to promote functional strength (12/10/2016)   Time 4   Period Weeks     PT SHORT TERM GOAL #3   Title pt will be able to tolerate standing for >/= 10 minutes with </= 5/10 pain in the low back and bil LE's to promote function and ADLS (12/07/3662)   Baseline 10-5 minutes.  varies form day to day   Time 4   Period Weeks   Status Partially Met     PT SHORT TERM GOAL #4   Title she will demonstrate decreased muscle spasm to promote hamstring flexibility by >/= 5 degrees on the L to promote increased mobility  (12/10/2016)   Time 4   Period Weeks   Status Unable to assess           PT Long Term Goals - 11/09/16 1454      PT LONG TERM GOAL #1   Title Pt will be I with advanced HEP (01/04/17)   Time 8   Period Weeks   Status New     PT LONG TERM GOAL #2   Title she will increase bil hip strength to >/= 4/5 with </= 2/10 pain to promote strength required for ADLS and prolonged walking / standing activities (01/04/17)   Time 8   Period Weeks   Status New     PT LONG TERM GOAL #3   Title she will increase L hamstring flexibility to >/= 65 degrees to assist with prolonged standing and walking with LRAD with </= 2/10 pain (01/04/17)   Baseline *   Time 8   Period Weeks   Status New     PT LONG TERM GOAL #4   Title She will will be able to walk/ stand for >/=15 minutes with LRAD to promote functional mobility and endurance required for community ambulation and ADLs (01/04/17)   Baseline *   Time 8   Period Weeks   Status New     PT LONG TERM GOAL #5   Title pt will increaese bil hip flexibility in thomas test position to </= 3 degrees of hip flexion bil to demonstrate reduced hip flexor tightness (01/04/17)   Baseline *   Time 8   Period Weeks   Status New  Plan - 11/26/16 1324    Clinical Impression Statement Pt enters clinic 4/10 pain in back but no new exacerbations.  3 way SLR added to HEP for increase in hip ext  and hip abduction for functional movement.  Stretching and strengthening continued today.  Pt was apprehensive about trying trigger point dry needling without anxiety medication.  She is currently seeking RX from MD before attempting.  Willl continue to progress as pt is able.   Rehab Potential Good      Patient will benefit from skilled therapeutic intervention in order to improve the following deficits and impairments:  Pain, Improper body mechanics, Postural dysfunction, Impaired flexibility, Decreased strength, Decreased mobility, Impaired tone, Increased muscle spasms, Increased fascial restricitons, Abnormal gait, Decreased activity tolerance, Decreased endurance, Decreased balance, Difficulty walking, Decreased range of motion  Visit Diagnosis: Chronic bilateral low back pain, with sciatica presence unspecified  Muscle weakness (generalized)  Abnormal posture  Other abnormalities of gait and mobility  Stiffness of left hip, not elsewhere classified  Stiffness of right hip, not elsewhere classified     Problem List There are no active problems to display for this patient.   Voncille Lo, PT Exercise Expert for the Aging Adult  11/26/16 1:29 PM Phone: [phone] Fax: [facility] [address] [place], [place], [address] Phone: [phone]   Fax:  [phone]  Name: Ashlee [name] MRN: [id number] Date of Birth: 11-12-1985

## 2016-11-30 ENCOUNTER — Ambulatory Visit: Payer: Medicare Other | Admitting: Physical Therapy

## 2016-11-30 DIAGNOSIS — M545 Low back pain: Secondary | ICD-10-CM | POA: Diagnosis not present

## 2016-11-30 DIAGNOSIS — R293 Abnormal posture: Secondary | ICD-10-CM

## 2016-11-30 DIAGNOSIS — M25652 Stiffness of left hip, not elsewhere classified: Secondary | ICD-10-CM

## 2016-11-30 DIAGNOSIS — M25651 Stiffness of right hip, not elsewhere classified: Secondary | ICD-10-CM

## 2016-11-30 DIAGNOSIS — M6281 Muscle weakness (generalized): Secondary | ICD-10-CM

## 2016-11-30 DIAGNOSIS — R2689 Other abnormalities of gait and mobility: Secondary | ICD-10-CM

## 2016-11-30 DIAGNOSIS — G8929 Other chronic pain: Secondary | ICD-10-CM

## 2016-11-30 NOTE — Therapy (Signed)
[facility] [address] [place], [place], [address] Phone: [phone]   Fax:  [phone]  Physical Therapy Treatment  Patient Details  Name: Ashlee [name] MRN: [id number] Date of Birth: 11-18-1985 Referring Provider: Desma Maxim MD  Encounter Date: 11/30/2016      PT End of Session - 11/30/16 1244    Visit Number 7   Number of Visits 24   Date for PT Re-Evaluation 01/04/17   PT Start Time 1147   PT Stop Time 1240   PT Time Calculation (min) 53 min   Activity Tolerance Patient tolerated treatment well   Behavior During Therapy [facility] for tasks assessed/performed      Past Medical History:  Diagnosis Date  . Allergy    some nuts, juniper bushes  . Anxiety   . Back pain   . GERD (gastroesophageal reflux disease)   . Migraines   . Mitral valve prolapse   . Patent foramen ovale   . Restless leg syndrome   . Rheumatoid arthritis(714.0)   . Sciatica   . Scoliosis   . Spinal cord disease [facility])     Past Surgical History:  Procedure Laterality Date  . BACK SURGERY    . CARDIAC CATHETERIZATION      There were no vitals filed for this visit.      Subjective Assessment - 11/30/16 1148    Subjective "Last couple days my back and legs have been bothering me more. I have been taking Dilaudid about 3x a day and it is only mildly successful at relieving pain. Took medicine at 11 this morning."   Currently in Pain? Yes   Pain Score 6    Pain Location Back   Pain Orientation Lower;Left;Mid   Pain Descriptors / Indicators Aching   Pain Type Chronic pain   Pain Radiating Towards both feet   Pain Frequency Constant   Aggravating Factors  cold weather and rain, standing and walking   Pain Relieving Factors medication, heat   Multiple Pain Sites Yes   Pain Score 7   Pain Location Leg   Pain Orientation Right;Left;Proximal   Pain Descriptors / Indicators Burning;Pins and needles   Aggravating Factors  movement  or touch   Pain Relieving Factors medication                         [facility] Adult PT Treatment/Exercise - 11/30/16 0001      Lumbar Exercises: Stretches   Passive Hamstring Stretch 2 reps;30 seconds   Single Knee to Chest Stretch 2 reps;30 seconds   Piriformis Stretch 2 reps;30 seconds     Lumbar Exercises: Aerobic   Stationary Bike Nu-step L 5 x 10 min  UE/LE     Knee/Hip Exercises: Stretches   Lobbyist 3 reps;30 seconds   Quad Stretch Limitations IASTM over rectus femoris     Moist Heat Therapy   Number Minutes Moist Heat 10 Minutes   Moist Heat Location Hip  R hip and thigh     Manual Therapy   Joint Mobilization grade 3-4 bil posterior hip mobs, long axis distraction grade 3 bil   Soft tissue mobilization IASTM over the rectus femoris while on stretch                   PT Short Term Goals - 11/25/16 1406      PT SHORT TERM GOAL #1   Title pt will be I with inital  HEP given (12/10/2016)   Baseline independent with all except with hip flexor stretch   Time 4   Period Weeks   Status Partially Met     PT SHORT TERM GOAL #2   Title pt will increase bil hip strength to >/=4-/5 with </=5/10 pain to promote functional strength (12/10/2016)   Time 4   Period Weeks     PT SHORT TERM GOAL #3   Title pt will be able to tolerate standing for >/= 10 minutes with </= 5/10 pain in the low back and bil LE's to promote function and ADLS (01/09/7672)   Baseline 10-5 minutes.  varies form day to day   Time 4   Period Weeks   Status Partially Met     PT SHORT TERM GOAL #4   Title she will demonstrate decreased muscle spasm to promote hamstring flexibility by >/= 5 degrees on the L to promote increased mobility  (12/10/2016)   Time 4   Period Weeks   Status Unable to assess           PT Long Term Goals - 11/09/16 1454      PT LONG TERM GOAL #1   Title Pt will be I with advanced HEP (01/04/17)   Time 8   Period Weeks   Status New     PT LONG TERM  GOAL #2   Title she will increase bil hip strength to >/= 4/5 with </= 2/10 pain to promote strength required for ADLS and prolonged walking / standing activities (01/04/17)   Time 8   Period Weeks   Status New     PT LONG TERM GOAL #3   Title she will increase L hamstring flexibility to >/= 65 degrees to assist with prolonged standing and walking with LRAD with </= 2/10 pain (01/04/17)   Baseline *   Time 8   Period Weeks   Status New     PT LONG TERM GOAL #4   Title She will will be able to walk/ stand for >/=15 minutes with LRAD to promote functional mobility and endurance required for community ambulation and ADLs (01/04/17)   Baseline *   Time 8   Period Weeks   Status New     PT LONG TERM GOAL #5   Title pt will increaese bil hip flexibility in thomas test position to </= 3 degrees of hip flexion bil to demonstrate reduced hip flexor tightness (01/04/17)   Baseline *   Time 8   Period Weeks   Status New               Plan - 11/30/16 1245    Clinical Impression Statement Pt reports increase in pain in the last few days especially in bil hips and lateral thighs. She is still seeking RX from MD before DN. Focused on stretching and joint mobs at the hips to decrease pain and tightness. Reports most of her pain is coming from L IT band. Requested MHP post session for R hip.    PT Next Visit Plan updated HEP PRN, modify stretching PRN, hip stregthening, DN, modalities, DTM over the hip flexors and quads, core strengthening   PT Home Exercise Plan glute med / piriformis/ hip flexor stretching, clamshells, SLR,    Consulted and Agree with Plan of Care Patient      Patient will benefit from skilled therapeutic intervention in order to improve the following deficits and impairments:  Pain, Improper body mechanics, Postural dysfunction, Impaired flexibility,  Decreased strength, Decreased mobility, Impaired tone, Increased muscle spasms, Increased fascial restricitons, Abnormal gait,  Decreased activity tolerance, Decreased endurance, Decreased balance, Difficulty walking, Decreased range of motion  Visit Diagnosis: Muscle weakness (generalized)  Abnormal posture  Other abnormalities of gait and mobility  Stiffness of left hip, not elsewhere classified  Stiffness of right hip, not elsewhere classified  Chronic bilateral low back pain, with sciatica presence unspecified     Problem List There are no active problems to display for this patient.   Alda Ponder, SPT 11/30/2016, 12:58 PM  [facility] [address] [place], [place], [address] Phone: [phone]   Fax:  [phone]  Name: Ashlee [name] MRN: [id number] Date of Birth: November 15, 1985   Starr [place] PT, DPT, LAT, ATC  11/30/16  1:02 PM

## 2016-12-02 ENCOUNTER — Ambulatory Visit: Payer: Medicare Other | Admitting: Physical Therapy

## 2016-12-02 DIAGNOSIS — R293 Abnormal posture: Secondary | ICD-10-CM

## 2016-12-02 DIAGNOSIS — M25652 Stiffness of left hip, not elsewhere classified: Secondary | ICD-10-CM

## 2016-12-02 DIAGNOSIS — M545 Low back pain: Secondary | ICD-10-CM | POA: Diagnosis not present

## 2016-12-02 DIAGNOSIS — R2689 Other abnormalities of gait and mobility: Secondary | ICD-10-CM

## 2016-12-02 DIAGNOSIS — M25651 Stiffness of right hip, not elsewhere classified: Secondary | ICD-10-CM

## 2016-12-02 DIAGNOSIS — G8929 Other chronic pain: Secondary | ICD-10-CM

## 2016-12-02 DIAGNOSIS — M6281 Muscle weakness (generalized): Secondary | ICD-10-CM

## 2016-12-02 NOTE — Therapy (Signed)
[place], [place], [address] Phone: [phone]   Fax:  [phone]  Physical Therapy Treatment  Patient Details  Name: Ashlee [name] MRN: [id number] Date of Birth: 1986/09/19 Referring Provider: Vivi Barrack MD  Encounter Date: 12/02/2016      PT End of Session - 12/02/16 1246    Visit Number 8   Number of Visits 24   Date for PT Re-Evaluation 01/04/17   PT Start Time 1146   PT Stop Time 1240   PT Time Calculation (min) 54 min   Activity Tolerance Patient tolerated treatment well   Behavior During Therapy [facility] for tasks assessed/performed      Past Medical History:  Diagnosis Date  . Allergy    some nuts, juniper bushes  . Anxiety   . Back pain   . GERD (gastroesophageal reflux disease)   . Migraines   . Mitral valve prolapse   . Patent foramen ovale   . Restless leg syndrome   . Rheumatoid arthritis(714.0)   . Sciatica   . Scoliosis   . Spinal cord disease [facility])     Past Surgical History:  Procedure Laterality Date  . BACK SURGERY    . CARDIAC CATHETERIZATION      There were no vitals filed for this visit.      Subjective Assessment - 12/02/16 1146    Subjective "overall I have had more pain in my legs than I had been having. I have been doing some stretching, has not really decreased pain."    Currently in Pain? Yes   Pain Score 4    Pain Location Back   Pain Orientation Lower;Mid;Left   Pain Descriptors / Indicators Burning;Shooting   Aggravating Factors  standing and walking   Pain Relieving Factors heat, medication, resting    Pain Score 6   Pain Location Leg   Pain Orientation Right;Left;Proximal   Pain Descriptors / Indicators Burning;Pins and needles   Aggravating Factors  movement, touch   Pain Relieving Factors rest, medication                         [facility] Adult PT Treatment/Exercise - 12/02/16 0001      Lumbar Exercises: Stretches    Passive Hamstring Stretch 2 reps;30 seconds   Single Knee to Chest Stretch 2 reps;30 seconds   Piriformis Stretch 2 reps;30 seconds     Lumbar Exercises: Aerobic   Stationary Bike Nu-step L 5 x 10 min  UE/LE     Lumbar Exercises: Standing   Other Standing Lumbar Exercises standing straight leg extension using walker 2x 15 bil     Lumbar Exercises: Supine   Clam 15 reps  2 sets; one with yellow band and one with red band   Other Supine Lumbar Exercises modified table top with min assist 10 x 10 s hold      Knee/Hip Exercises: Stretches   Sports administrator 3 reps;30 seconds   Quad Stretch Limitations IASTM over rectus femoris     Manual Therapy   Soft tissue mobilization IASTM over the rectus femoris while on stretch                   PT Short Term Goals - 11/25/16 1406      PT SHORT TERM GOAL #1   Title pt will be I with inital HEP given (12/10/2016)   Baseline independent with all except with hip flexor stretch  Time 4   Period Weeks   Status Partially Met     PT SHORT TERM GOAL #2   Title pt will increase bil hip strength to >/=4-/5 with </=5/10 pain to promote functional strength (12/10/2016)   Time 4   Period Weeks     PT SHORT TERM GOAL #3   Title pt will be able to tolerate standing for >/= 10 minutes with </= 5/10 pain in the low back and bil LE's to promote function and ADLS (01/02/4400)   Baseline 10-5 minutes.  varies form day to day   Time 4   Period Weeks   Status Partially Met     PT SHORT TERM GOAL #4   Title she will demonstrate decreased muscle spasm to promote hamstring flexibility by >/= 5 degrees on the L to promote increased mobility  (12/10/2016)   Time 4   Period Weeks   Status Unable to assess           PT Long Term Goals - 11/09/16 1454      PT LONG TERM GOAL #1   Title Pt will be I with advanced HEP (01/04/17)   Time 8   Period Weeks   Status New     PT LONG TERM GOAL #2   Title she will increase bil hip strength to >/= 4/5 with  </= 2/10 pain to promote strength required for ADLS and prolonged walking / standing activities (01/04/17)   Time 8   Period Weeks   Status New     PT LONG TERM GOAL #3   Title she will increase L hamstring flexibility to >/= 65 degrees to assist with prolonged standing and walking with LRAD with </= 2/10 pain (01/04/17)   Baseline *   Time 8   Period Weeks   Status New     PT LONG TERM GOAL #4   Title She will will be able to walk/ stand for >/=15 minutes with LRAD to promote functional mobility and endurance required for community ambulation and ADLs (01/04/17)   Baseline *   Time 8   Period Weeks   Status New     PT LONG TERM GOAL #5   Title pt will increaese bil hip flexibility in thomas test position to </= 3 degrees of hip flexion bil to demonstrate reduced hip flexor tightness (01/04/17)   Baseline *   Time 8   Period Weeks   Status New               Plan - 12/02/16 1248    Clinical Impression Statement Pt reports her leg pain has still been up recently. She has been working on stretching at home. Focused on stretching today and engaging core. Continued MHP post session for R hip.    PT Next Visit Plan updated HEP PRN, modify stretching PRN, hip stregthening, DN, modalities, DTM over the hip flexors and quads, core strengthening   PT Home Exercise Plan glute med / piriformis/ hip flexor stretching, clamshells, SLR,    Consulted and Agree with Plan of Care Patient      Patient will benefit from skilled therapeutic intervention in order to improve the following deficits and impairments:  Pain, Improper body mechanics, Postural dysfunction, Impaired flexibility, Decreased strength, Decreased mobility, Impaired tone, Increased muscle spasms, Increased fascial restricitons, Abnormal gait, Decreased activity tolerance, Decreased endurance, Decreased balance, Difficulty walking, Decreased range of motion  Visit Diagnosis: Muscle weakness (generalized)  Abnormal  posture  Other abnormalities of gait  and mobility  Stiffness of left hip, not elsewhere classified  Stiffness of right hip, not elsewhere classified  Chronic bilateral low back pain, with sciatica presence unspecified     Problem List There are no active problems to display for this patient.   Alda Ponder, SPT 12/02/2016, 12:59 PM  [facility] [address] [place], [place], [address] Phone: [phone]   Fax:  [phone]  Name: Ashlee [name] MRN: [id number] Date of Birth: [birth year]

## 2016-12-03 ENCOUNTER — Ambulatory Visit: Payer: Medicare Other | Attending: *Deleted | Admitting: Physical Therapy

## 2016-12-03 DIAGNOSIS — M545 Low back pain: Secondary | ICD-10-CM | POA: Diagnosis present

## 2016-12-03 DIAGNOSIS — G8929 Other chronic pain: Secondary | ICD-10-CM

## 2016-12-03 DIAGNOSIS — R29898 Other symptoms and signs involving the musculoskeletal system: Secondary | ICD-10-CM | POA: Diagnosis present

## 2016-12-03 DIAGNOSIS — R293 Abnormal posture: Secondary | ICD-10-CM | POA: Diagnosis present

## 2016-12-03 DIAGNOSIS — M546 Pain in thoracic spine: Secondary | ICD-10-CM | POA: Diagnosis present

## 2016-12-03 DIAGNOSIS — M25652 Stiffness of left hip, not elsewhere classified: Secondary | ICD-10-CM

## 2016-12-03 DIAGNOSIS — R2689 Other abnormalities of gait and mobility: Secondary | ICD-10-CM | POA: Diagnosis present

## 2016-12-03 DIAGNOSIS — M25651 Stiffness of right hip, not elsewhere classified: Secondary | ICD-10-CM

## 2016-12-03 DIAGNOSIS — M6281 Muscle weakness (generalized): Secondary | ICD-10-CM | POA: Diagnosis present

## 2016-12-03 NOTE — Therapy (Signed)
[place], [place], [address] Phone: [phone]   Fax:  [phone]  Physical Therapy Treatment  Patient Details  Name: Ashlee [name] MRN: [id number] Date of Birth: Mar 12, 1986 Referring Provider: Vivi Barrack MD  Encounter Date: 12/03/2016      PT End of Session - 12/03/16 1238    Visit Number 9   Number of Visits 24   Date for PT Re-Evaluation 01/04/17   Authorization Type Medicare: Kx mod by 15th visit, progress note by 10th visit.    PT Start Time 1145   PT Stop Time 1243   PT Time Calculation (min) 58 min   Equipment Utilized During Treatment --  RW  not wearing AFO's today   Activity Tolerance Patient tolerated treatment well   Behavior During Therapy WFL for tasks assessed/performed      Past Medical History:  Diagnosis Date  . Allergy    some nuts, juniper bushes  . Anxiety   . Back pain   . GERD (gastroesophageal reflux disease)   . Migraines   . Mitral valve prolapse   . Patent foramen ovale   . Restless leg syndrome   . Rheumatoid arthritis(714.0)   . Sciatica   . Scoliosis   . Spinal cord disease [facility])     Past Surgical History:  Procedure Laterality Date  . BACK SURGERY    . CARDIAC CATHETERIZATION      There were no vitals filed for this visit.      Subjective Assessment - 12/03/16 1149    Subjective Today I would like to try some dry needling.  I took 5 mg valium before RX so I can be prepared to needle   Pertinent History SCI 2012   Limitations Sitting;Standing;Lifting;Walking;House hold activities   How long can you sit comfortably? unlimited   Patient Stated Goals stretching out tight muscles in the LE, controlling pain,    Currently in Pain? Yes   Pain Score 4    Pain Location Back   Pain Orientation Lower;Mid;Left   Pain Descriptors / Indicators Burning;Shooting   Pain Type Chronic pain   Pain Onset More than a month ago   Pain Frequency Constant    Pain Score 5   Pain Location Hip  Into Right leg    Pain Orientation Right;Left   Pain Descriptors / Indicators Burning;Pins and needles   Pain Type Acute pain                         [facility] Adult PT Treatment/Exercise - 12/03/16 1240      Self-Care   Self-Care Other Self-Care Comments   Other Self-Care Comments  discussed stretching machines for home and education on trigger point dry needling precautians and aftercare     Lumbar Exercises: Stretches   Piriformis Stretch 2 reps;30 seconds     Knee/Hip Exercises: Prone   Other Prone Exercises prone quad stretch using strap. 3 x 30 sec     Modalities   Modalities Moist Heat     Moist Heat Therapy   Number Minutes Moist Heat 15 Minutes   Moist Heat Location Hip;Knee  right side     Manual Therapy   Manual Therapy Myofascial release;Soft tissue mobilization   Manual therapy comments deep tissue to iliopsoas strumming in supine   Soft tissue mobilization right gluteals and quadriceps   Myofascial Release right quad and right quadratus lumboru/  Trigger Point Dry Needling - 12/03/16 1154    Consent Given? Yes   Education Handout Provided Yes   Muscles Treated Upper Body Quadratus Lumborum  right side only with twitch response   Muscles Treated Lower Body Piriformis;Gluteus maximus;Gluteus minimus;Quadriceps  right side only   Gluteus Maximus Response Twitch response elicited;Palpable increased muscle length   Gluteus Minimus Response Twitch response elicited;Palpable increased muscle length   Piriformis Response Twitch response elicited;Palpable increased muscle length   Quadriceps Response Twitch response elicited;Palpable increased muscle length              PT Education - 12/03/16 1157    Education provided Yes   Education Details Education on trigger point dry needling and added prone quadriceps stretch   Person(s) Educated Patient   Methods Explanation;Demonstration;Tactile  cues;Verbal cues;Handout   Comprehension Verbalized understanding;Returned demonstration          PT Short Term Goals - 11/25/16 1406      PT SHORT TERM GOAL #1   Title pt will be I with inital HEP given (12/10/2016)   Baseline independent with all except with hip flexor stretch   Time 4   Period Weeks   Status Partially Met     PT SHORT TERM GOAL #2   Title pt will increase bil hip strength to >/=4-/5 with </=5/10 pain to promote functional strength (12/10/2016)   Time 4   Period Weeks     PT SHORT TERM GOAL #3   Title pt will be able to tolerate standing for >/= 10 minutes with </= 5/10 pain in the low back and bil LE's to promote function and ADLS (01/03/3544)   Baseline 10-5 minutes.  varies form day to day   Time 4   Period Weeks   Status Partially Met     PT SHORT TERM GOAL #4   Title she will demonstrate decreased muscle spasm to promote hamstring flexibility by >/= 5 degrees on the L to promote increased mobility  (12/10/2016)   Time 4   Period Weeks   Status Unable to assess           PT Long Term Goals - 11/09/16 1454      PT LONG TERM GOAL #1   Title Pt will be I with advanced HEP (01/04/17)   Time 8   Period Weeks   Status New     PT LONG TERM GOAL #2   Title she will increase bil hip strength to >/= 4/5 with </= 2/10 pain to promote strength required for ADLS and prolonged walking / standing activities (01/04/17)   Time 8   Period Weeks   Status New     PT LONG TERM GOAL #3   Title she will increase L hamstring flexibility to >/= 65 degrees to assist with prolonged standing and walking with LRAD with </= 2/10 pain (01/04/17)   Baseline *   Time 8   Period Weeks   Status New     PT LONG TERM GOAL #4   Title She will will be able to walk/ stand for >/=15 minutes with LRAD to promote functional mobility and endurance required for community ambulation and ADLs (01/04/17)   Baseline *   Time 8   Period Weeks   Status New     PT LONG TERM GOAL #5   Title pt  will increaese bil hip flexibility in thomas test position to </= 3 degrees of hip flexion bil to demonstrate reduced hip flexor tightness (  01/04/17)   Baseline *   Time 8   Period Weeks   Status New               Plan - 12/03/16 1230    Clinical Impression Statement Pt reports right hip pain 5/10 and   back 4/10.  Pt consented to trigger point dry needling and had  premedicated herself prior to physical therapy.  Pt consented to trigger point dry needling and was closely monitored throughout process.  Pt  spoke with PT about various stretching machines online and discussed pros and cons for each. She prefers the stretch loop portable leg stretcher.( somewhat like a home modified Pilates reformers.  Pt was instructed to utilize lying down and never standing.  Ms Bells was in complete agreement and is aware of neurological deficts.  Will continue to progress toward goals.   Rehab Potential Good   PT Frequency 3x / week   PT Duration 8 weeks   PT Treatment/Interventions ADLs/Self Care Home Management;Cryotherapy;Iontophoresis '4mg'$ /ml Dexamethasone;Electrical Stimulation;Moist Heat;Ultrasound;Gait training;Therapeutic activities;Therapeutic exercise;Patient/family education;Dry needling;Taping;Manual techniques;Balance training   PT Next Visit Plan added prone  quad stretch with strap with modifications.  Assess DN and DTM over hip flexors and quads Assess for G code next visit and check goals   PT Home Exercise Plan glute med / piriformis/ hip flexor stretching, clamshells, SLR, prone quad stretch   Consulted and Agree with Plan of Care Patient      Patient will benefit from skilled therapeutic intervention in order to improve the following deficits and impairments:  Pain, Improper body mechanics, Postural dysfunction, Impaired flexibility, Decreased strength, Decreased mobility, Impaired tone, Increased muscle spasms, Increased fascial restricitons, Abnormal gait, Decreased activity tolerance,  Decreased endurance, Decreased balance, Difficulty walking, Decreased range of motion  Visit Diagnosis: Muscle weakness (generalized)  Abnormal posture  Other abnormalities of gait and mobility  Stiffness of left hip, not elsewhere classified  Stiffness of right hip, not elsewhere classified  Chronic bilateral low back pain, with sciatica presence unspecified  Pain in thoracic spine  Tight unbalanced musculature     Problem List There are no active problems to display for this patient.   Dorothea Ogle 12/03/2016, 12:46 PM  [facility] [address] [place], [place], [address] Phone: [phone]   Fax:  [phone]  Name: DEANDA RUDDELL MRN: [id number] Date of Birth: Nov 30, 1985

## 2016-12-03 NOTE — Patient Instructions (Addendum)
Trigger Point Dry Needling  . What is Trigger Point Dry Needling (DN)? o DN is a physical therapy technique used to treat muscle pain and dysfunction. Specifically, DN helps deactivate muscle trigger points (muscle knots).  o A thin filiform needle is used to penetrate the skin and stimulate the underlying trigger point. The goal is for a local twitch response (LTR) to occur and for the trigger point to relax. No medication of any kind is injected during the procedure.   . What Does Trigger Point Dry Needling Feel Like?  o The procedure feels different for each individual patient. Some patients report that they do not actually feel the needle enter the skin and overall the process is not painful. Very mild bleeding may occur. However, many patients feel a deep cramping in the muscle in which the needle was inserted. This is the local twitch response.   Marland Kitchen How Will I feel after the treatment? o Soreness is normal, and the onset of soreness may not occur for a few hours. Typically this soreness does not last longer than two days.  o Bruising is uncommon, however; ice can be used to decrease any possible bruising.  o In rare cases feeling tired or nauseous after the treatment is normal. In addition, your symptoms may get worse before they get better, this period will typically not last longer than 24 hours.   . What Can I do After My Treatment? o Increase your hydration by drinking more water for the next 24 hours. o You may place ice or heat on the areas treated that have become sore, however, do not use heat on inflamed or bruised areas. Heat often brings more relief post needling. o You can continue your regular activities, but vigorous activity is not recommended initially after the treatment for 24 hours. o DN is best combined with other physical therapy such as strengthening, stretching, and other therapies.   Ashlee [name], PT Exercise Expert for the Aging Adult  12/03/16 11:56 AM Phone:  [phone] Fax: [phone]   Quadriceps (Prone)    On stomach with sheet around ankles, knees together, hips down, pull heels toward bottom. Keep hips flat. Hold _30___ seconds. Repeat _3___ times. Do _1-2___ sessions per day. Place pillow/block/foam roller under knee to add stretch to quadriceps as shown in clinic  Use Pillow under stomach for comfort and support of back. CAUTION: Stretch should be gentle, steady and slow.  Copyright  VHI. All rights reserved.   Ashlee [name], PT Exercise Expert for the Aging Adult  12/03/16 11:57 AM Phone: [phone] Fax: [phone]

## 2016-12-07 ENCOUNTER — Ambulatory Visit: Payer: Medicare Other | Admitting: Physical Therapy

## 2016-12-07 DIAGNOSIS — R2689 Other abnormalities of gait and mobility: Secondary | ICD-10-CM

## 2016-12-07 DIAGNOSIS — R293 Abnormal posture: Secondary | ICD-10-CM

## 2016-12-07 DIAGNOSIS — M25652 Stiffness of left hip, not elsewhere classified: Secondary | ICD-10-CM

## 2016-12-07 DIAGNOSIS — M546 Pain in thoracic spine: Secondary | ICD-10-CM

## 2016-12-07 DIAGNOSIS — M6281 Muscle weakness (generalized): Secondary | ICD-10-CM | POA: Diagnosis not present

## 2016-12-07 DIAGNOSIS — M25651 Stiffness of right hip, not elsewhere classified: Secondary | ICD-10-CM

## 2016-12-07 DIAGNOSIS — G8929 Other chronic pain: Secondary | ICD-10-CM

## 2016-12-07 DIAGNOSIS — M545 Low back pain: Secondary | ICD-10-CM

## 2016-12-07 DIAGNOSIS — R29898 Other symptoms and signs involving the musculoskeletal system: Secondary | ICD-10-CM

## 2016-12-07 NOTE — Therapy (Signed)
[place], [place], [address] Phone: [phone]   Fax:  [phone]  Physical Therapy Treatment/ Progress Note  Patient Details  Name: Ashlee [name] MRN: [id number] Date of Birth: 14-Oct-1986 Referring Provider: Vivi Barrack MD  Encounter Date: 12/07/2016      PT End of Session - 12/07/16 1422    Visit Number 10   Number of Visits 24   Date for PT Re-Evaluation 01/04/17   PT Start Time 6962   PT Stop Time 1422   PT Time Calculation (min) 60 min   Activity Tolerance Patient tolerated treatment well   Behavior During Therapy [facility] for tasks assessed/performed      Past Medical History:  Diagnosis Date  . Allergy    some nuts, juniper bushes  . Anxiety   . Back pain   . GERD (gastroesophageal reflux disease)   . Migraines   . Mitral valve prolapse   . Patent foramen ovale   . Restless leg syndrome   . Rheumatoid arthritis(714.0)   . Sciatica   . Scoliosis   . Spinal cord disease [facility])     Past Surgical History:  Procedure Laterality Date  . BACK SURGERY    . CARDIAC CATHETERIZATION      There were no vitals filed for this visit.      Subjective Assessment - 12/07/16 1318    Subjective "AFO has a screw missing so I will not be wearing them until next week when they are repaired. Back feels a little better after waearing my back brace last week. Pain is only up now because my next medication dose has not quite kicked in."   Currently in Pain? Yes   Pain Score 6    Pain Location Back   Pain Orientation Lower;Mid;Left   Pain Descriptors / Indicators Burning   Pain Score 6   Pain Location Hip   Pain Orientation Right   Pain Descriptors / Indicators Burning;Aching            [facility] PT Assessment - 12/07/16 0001      AROM   Right Hip Flexion 92   Right Hip ABduction 11   Left Hip Flexion 65   Left Hip ABduction 10     PROM   Left Hip Flexion 105   Left Hip ABduction 17      Strength   Right Hip Flexion 3/5   Right Hip Extension 3/5   Right Hip ABduction 4-/5   Right Hip ADduction 4-/5   Left Hip Flexion 3/5   Left Hip Extension 3/5   Left Hip ABduction 4-/5   Left Hip ADduction 4-/5   Right Knee Flexion 4/5   Right Knee Extension 4+/5   Left Knee Flexion 4+/5   Left Knee Extension 4+/5     Flexibility   Hamstrings R 45 degrees and L 55 grees with SLR     Thomas Test    Findings Positive   Side --  bil   Comments 8 degrees flexion on R, 4 degrees flexion on the L                     [facility] Adult PT Treatment/Exercise - 12/07/16 0001      Lumbar Exercises: Stretches   Passive Hamstring Stretch 2 reps;30 seconds   Single Knee to Chest Stretch 2 reps;30 seconds   Piriformis Stretch 30 seconds;2 reps     Lumbar Exercises: Aerobic  Stationary Bike Nu-step L 5 x 10 min  UE/LE     Lumbar Exercises: Standing   Other Standing Lumbar Exercises gastroc stretch 3 x 30  on slant board     Knee/Hip Exercises: Stretches   Quad Stretch 3 reps;30 seconds  bil     Moist Heat Therapy   Number Minutes Moist Heat 10 Minutes   Moist Heat Location Hip  R side                  PT Short Term Goals - 12/07/16 1401      PT SHORT TERM GOAL #1   Title pt will be I with inital HEP given (12/10/2016)   Time 4   Period Weeks   Status Achieved     PT SHORT TERM GOAL #2   Title pt will increase bil hip strength to >/=4-/5 with </=5/10 pain to promote functional strength (12/10/2016)   Baseline hip flex/ext 3/5   Time 4   Period Weeks   Status Partially Met     PT SHORT TERM GOAL #3   Title pt will be able to tolerate standing for >/= 10 minutes with </= 5/10 pain in the low back and bil LE's to promote function and ADLS (4/0/9811)   Baseline depends on the day   Time 4   Period Weeks   Status Partially Met     PT SHORT TERM GOAL #4   Title she will demonstrate decreased muscle spasm to promote hamstring flexibility by >/= 5  degrees on the L to promote increased mobility  (12/10/2016)   Time 4   Period Weeks   Status On-going           PT Long Term Goals - 12/07/16 1419      PT LONG TERM GOAL #1   Title Pt will be I with advanced HEP (01/04/17)   Time 8   Period Weeks   Status On-going     PT LONG TERM GOAL #2   Title she will increase bil hip strength to >/= 4/5 with </= 2/10 pain to promote strength required for ADLS and prolonged walking / standing activities (01/04/17)   Time 8   Period Weeks   Status On-going     PT LONG TERM GOAL #3   Title she will increase L hamstring flexibility to >/= 65 degrees to assist with prolonged standing and walking with LRAD with </= 2/10 pain (01/04/17)   Time 8   Period Weeks   Status On-going     PT LONG TERM GOAL #4   Title She will will be able to walk/ stand for >/=15 minutes with LRAD to promote functional mobility and endurance required for community ambulation and ADLs (01/04/17)   Time 8   Period Weeks   Status On-going     PT LONG TERM GOAL #5   Title pt will increaese bil hip flexibility in thomas test position to </= 3 degrees of hip flexion bil to demonstrate reduced hip flexor tightness (01/04/17)   Time 8   Period Weeks   Status On-going               Plan - 12/07/16 1427    Clinical Impression Statement Pt reports R hip pain and back pain at a 6/10. Reports that she believes it is from the ups and downs in weather, and that her pain pill had not yet had a lot of time to get into her system. She has  not been wearing her AFOs because they are being repaired. pt reports increased tightness in hamstrings and calves recently. Focused on stretching to improve ROM. She reports having no problems with her current HEP. pt goals are on-going. pt would continue to benefit from physical therapy to increase ROM, improve strength, and decrease pain. Continued MHP post session.    Rehab Potential Good   PT Frequency 3x / week   PT Duration 8 weeks   PT  Treatment/Interventions ADLs/Self Care Home Management;Cryotherapy;Iontophoresis '4mg'$ /ml Dexamethasone;Electrical Stimulation;Moist Heat;Ultrasound;Gait training;Therapeutic activities;Therapeutic exercise;Patient/family education;Dry needling;Taping;Manual techniques;Balance training   PT Next Visit Plan added prone  quad stretch with strap with modifications.  Assess DN and DTM over hip flexors and quads   PT Home Exercise Plan glute med / piriformis/ hip flexor stretching, clamshells, SLR, prone quad stretch   Consulted and Agree with Plan of Care Patient      Patient will benefit from skilled therapeutic intervention in order to improve the following deficits and impairments:  Pain, Improper body mechanics, Postural dysfunction, Impaired flexibility, Decreased strength, Decreased mobility, Impaired tone, Increased muscle spasms, Increased fascial restricitons, Abnormal gait, Decreased activity tolerance, Decreased endurance, Decreased balance, Difficulty walking, Decreased range of motion  Visit Diagnosis: Muscle weakness (generalized)  Abnormal posture  Other abnormalities of gait and mobility  Stiffness of left hip, not elsewhere classified  Stiffness of right hip, not elsewhere classified  Chronic bilateral low back pain, with sciatica presence unspecified  Pain in thoracic spine  Tight unbalanced musculature       G-Codes - 12-14-16 1437    Functional Assessment Tool Used clinical judgement   Functional Limitation Mobility: Walking and moving around   Mobility: Walking and Moving Around Current Status [phone]) At least 60 percent but less than 80 percent impaired, limited or restricted   Mobility: Walking and Moving Around Goal Status [phone]) At least 40 percent but less than 60 percent impaired, limited or restricted      Problem List There are no active problems to display for this patient.   Alda Ponder, SPT 12/14/16, 2:42 PM  [facility] [address] [place], [place], [address] Phone: [phone]   Fax:  [phone]  Name: DANAISHA CELLI MRN: [id number] Date of Birth: [birth year]

## 2016-12-09 ENCOUNTER — Ambulatory Visit: Payer: Medicare Other | Admitting: Physical Therapy

## 2016-12-09 DIAGNOSIS — M6281 Muscle weakness (generalized): Secondary | ICD-10-CM

## 2016-12-09 DIAGNOSIS — R2689 Other abnormalities of gait and mobility: Secondary | ICD-10-CM

## 2016-12-09 DIAGNOSIS — M25651 Stiffness of right hip, not elsewhere classified: Secondary | ICD-10-CM

## 2016-12-09 DIAGNOSIS — M25652 Stiffness of left hip, not elsewhere classified: Secondary | ICD-10-CM

## 2016-12-09 DIAGNOSIS — M545 Low back pain: Secondary | ICD-10-CM

## 2016-12-09 DIAGNOSIS — G8929 Other chronic pain: Secondary | ICD-10-CM

## 2016-12-09 DIAGNOSIS — R293 Abnormal posture: Secondary | ICD-10-CM

## 2016-12-09 NOTE — Therapy (Signed)
[place], [place], [address] Phone: [phone]   Fax:  [phone]  Physical Therapy Treatment  Patient Details  Name: Ashlee [name] MRN: [id number] Date of Birth: Mar 15, 1986 Referring Provider: Vivi Barrack MD  Encounter Date: 12/09/2016    Past Medical History:  Diagnosis Date  . Allergy    some nuts, juniper bushes  . Anxiety   . Back pain   . GERD (gastroesophageal reflux disease)   . Migraines   . Mitral valve prolapse   . Patent foramen ovale   . Restless leg syndrome   . Rheumatoid arthritis(714.0)   . Sciatica   . Scoliosis   . Spinal cord disease [facility])     Past Surgical History:  Procedure Laterality Date  . BACK SURGERY    . CARDIAC CATHETERIZATION      There were no vitals filed for this visit.      Subjective Assessment - 12/09/16 1151    Subjective "Still haven't gotten my AFO's back because the screw was missing. Last night I was having some weird muscle spasm in the back. The legs have been feeling up and down"    Currently in Pain? Yes   Pain Score 4    Pain Location Back   Pain Orientation Lower;Mid;Left   Pain Onset More than a month ago   Pain Frequency Constant   Aggravating Factors  standing and walking   Pain Relieving Factors heat, medication, resting,    Pain Score 4   Pain Location Hip   Pain Orientation Right                         [facility] Adult PT Treatment/Exercise - 12/09/16 0001      Lumbar Exercises: Stretches   Passive Hamstring Stretch 2 reps;30 seconds   Single Knee to Chest Stretch 2 reps;30 seconds   Piriformis Stretch 30 seconds;2 reps     Lumbar Exercises: Aerobic   Stationary Bike Nu-step L3 x 10 min  LE only     Knee/Hip Exercises: Stretches   Hip Flexor Stretch 4 reps;30 seconds   Gastroc Stretch 2 reps;30 seconds  bil using slant board   Other Knee/Hip Stretches bil hip adductor stretch 3 x 30 sec hold      Knee/Hip Exercises: Seated   Other Seated Knee/Hip Exercises hip IR 2 x 10 ea. with ball between knees using yellow band     Knee/Hip Exercises: Supine   Bridges with Clamshell Strengthening;Both;15 reps;1 set  with red theraband     Manual Therapy   Manual therapy comments deep tissue to iliopsoas strumming in supine                PT Education - 12/09/16 1238    Education provided --   Education Details --   Person(s) Educated --   Methods --   Comprehension --          PT Short Term Goals - 12/07/16 1401      PT SHORT TERM GOAL #1   Title pt will be I with inital HEP given (12/10/2016)   Time 4   Period Weeks   Status Achieved     PT SHORT TERM GOAL #2   Title pt will increase bil hip strength to >/=4-/5 with </=5/10 pain to promote functional strength (12/10/2016)   Baseline hip flex/ext 3/5   Time 4   Period Weeks   Status Partially  Met     PT SHORT TERM GOAL #3   Title pt will be able to tolerate standing for >/= 10 minutes with </= 5/10 pain in the low back and bil LE's to promote function and ADLS (04/05/4033)   Baseline depends on the day   Time 4   Period Weeks   Status Partially Met     PT SHORT TERM GOAL #4   Title Ashlee [name] will demonstrate decreased muscle spasm to promote hamstring flexibility by >/= 5 degrees on the L to promote increased mobility  (12/10/2016)   Time 4   Period Weeks   Status On-going           PT Long Term Goals - 12/07/16 1419      PT LONG TERM GOAL #1   Title Pt will be I with advanced HEP (01/04/17)   Time 8   Period Weeks   Status On-going     PT LONG TERM GOAL #2   Title Ashlee [name] will increase bil hip strength to >/= 4/5 with </= 2/10 pain to promote strength required for ADLS and prolonged walking / standing activities (01/04/17)   Time 8   Period Weeks   Status On-going     PT LONG TERM GOAL #3   Title Ashlee [name] will increase L hamstring flexibility to >/= 65 degrees to assist with prolonged standing and walking with LRAD  with </= 2/10 pain (01/04/17)   Time 8   Period Weeks   Status On-going     PT LONG TERM GOAL #4   Title Ashlee [name] will will be able to walk/ stand for >/=15 minutes with LRAD to promote functional mobility and endurance required for community ambulation and ADLs (01/04/17)   Time 8   Period Weeks   Status On-going     PT LONG TERM GOAL #5   Title pt will increaese bil hip flexibility in thomas test position to </= 3 degrees of hip flexion bil to demonstrate reduced hip flexor tightness (01/04/17)   Time 8   Period Weeks   Status On-going               Plan - 12/09/16 1301    Clinical Impression Statement Ashlee [name] reported decreased soreness today. continued stretching inthe hip / low back. worked on hip ER exercises to work toward keeping Ashlee [name] knees apart with standing/ walking. Continued hip strengthening in supine. Ashlee [name] reported decreased pain post session and declined modalities.    PT Next Visit Plan added prone  quad stretch with strap with modifications.  Assess DN and DTM over hip flexors and quads, hip strengthening.    Consulted and Agree with Plan of Care Patient      Patient will benefit from skilled therapeutic intervention in order to improve the following deficits and impairments:  Pain, Improper body mechanics, Postural dysfunction, Impaired flexibility, Decreased strength, Decreased mobility, Impaired tone, Increased muscle spasms, Increased fascial restricitons, Abnormal gait, Decreased activity tolerance, Decreased endurance, Decreased balance, Difficulty walking, Decreased range of motion  Visit Diagnosis: Muscle weakness (generalized)  Abnormal posture  Other abnormalities of gait and mobility  Stiffness of left hip, not elsewhere classified  Stiffness of right hip, not elsewhere classified  Chronic bilateral low back pain, with sciatica presence unspecified     Problem List There are no active problems to display for this patient.  Starr Lake PT,  DPT, LAT, ATC  12/09/16  1:06 PM      [facility] [phone]  [place], [place], [address] Phone: [phone]   Fax:  [phone]  Name: Ashlee [name] MRN: [id number] Date of Birth: 1986/02/27

## 2016-12-10 ENCOUNTER — Encounter: Payer: Medicare Other | Admitting: Physical Therapy

## 2016-12-10 ENCOUNTER — Ambulatory Visit: Payer: Medicare Other | Admitting: Physical Therapy

## 2016-12-14 ENCOUNTER — Ambulatory Visit: Payer: Medicare Other | Admitting: Physical Therapy

## 2016-12-14 DIAGNOSIS — M25652 Stiffness of left hip, not elsewhere classified: Secondary | ICD-10-CM

## 2016-12-14 DIAGNOSIS — G8929 Other chronic pain: Secondary | ICD-10-CM

## 2016-12-14 DIAGNOSIS — M25651 Stiffness of right hip, not elsewhere classified: Secondary | ICD-10-CM

## 2016-12-14 DIAGNOSIS — R2689 Other abnormalities of gait and mobility: Secondary | ICD-10-CM

## 2016-12-14 DIAGNOSIS — M6281 Muscle weakness (generalized): Secondary | ICD-10-CM

## 2016-12-14 DIAGNOSIS — M546 Pain in thoracic spine: Secondary | ICD-10-CM

## 2016-12-14 DIAGNOSIS — R293 Abnormal posture: Secondary | ICD-10-CM

## 2016-12-14 DIAGNOSIS — M545 Low back pain: Secondary | ICD-10-CM

## 2016-12-14 DIAGNOSIS — R29898 Other symptoms and signs involving the musculoskeletal system: Secondary | ICD-10-CM

## 2016-12-14 NOTE — Therapy (Signed)
[place], [place], [address] Phone: [phone]   Fax:  [phone]  Physical Therapy Treatment  Patient Details  Name: CASAUNDRA TAKACS MRN: [id number] Date of Birth: July 29, 1986 Referring Provider: Vivi Barrack MD  Encounter Date: 12/14/2016      PT End of Session - 12/14/16 1203    Visit Number 11   Number of Visits 24   Date for PT Re-Evaluation 01/04/17   PT Start Time 1100   PT Stop Time 1150   PT Time Calculation (min) 50 min   Activity Tolerance Patient tolerated treatment well   Behavior During Therapy [facility] for tasks assessed/performed      Past Medical History:  Diagnosis Date  . Allergy    some nuts, juniper bushes  . Anxiety   . Back pain   . GERD (gastroesophageal reflux disease)   . Migraines   . Mitral valve prolapse   . Patent foramen ovale   . Restless leg syndrome   . Rheumatoid arthritis(714.0)   . Sciatica   . Scoliosis   . Spinal cord disease [facility])     Past Surgical History:  Procedure Laterality Date  . BACK SURGERY    . CARDIAC CATHETERIZATION      There were no vitals filed for this visit.      Subjective Assessment - 12/14/16 1101    Subjective "It has been going. I have temporarily fixed AFO's but he said it is time I get new ones."    Currently in Pain? Yes   Pain Score 2   took dilaudid and laid on heat pad this morning   Pain Location Back   Pain Orientation Left;Lower;Mid   Pain Descriptors / Indicators Aching;Burning   Pain Type Chronic pain   Pain Onset More than a month ago   Pain Frequency Constant   Aggravating Factors  standing and walking   Pain Relieving Factors heat, medication, resting   Pain Score 6   Pain Location Hip   Pain Orientation Right   Pain Descriptors / Indicators Burning;Aching   Pain Type Acute pain   Aggravating Factors  movement   Pain Relieving Factors rest medication, heat                          [facility] Adult PT Treatment/Exercise - 12/14/16 1109      Lumbar Exercises: Stretches   Passive Hamstring Stretch 2 reps;30 seconds   Single Knee to Chest Stretch 2 reps;30 seconds   Piriformis Stretch 30 seconds;2 reps     Lumbar Exercises: Aerobic   Stationary Bike Nu-step L3 x 10 min  LE only     Knee/Hip Exercises: Stretches   Hip Flexor Stretch 4 reps;30 seconds     Knee/Hip Exercises: Seated   Other Seated Knee/Hip Exercises hip ER 2 x 10 ea. with ball between knees using yellow band     Knee/Hip Exercises: Supine   Bridges with Clamshell Strengthening;Both;15 reps;2 sets  red band     Manual Therapy   Soft tissue mobilization IASTM over R and L quad during hip flexor stretch                  PT Short Term Goals - 12/07/16 1401      PT SHORT TERM GOAL #1   Title pt will be I with inital HEP given (12/10/2016)   Time 4   Period Weeks   Status  Achieved     PT SHORT TERM GOAL #2   Title pt will increase bil hip strength to >/=4-/5 with </=5/10 pain to promote functional strength (12/10/2016)   Baseline hip flex/ext 3/5   Time 4   Period Weeks   Status Partially Met     PT SHORT TERM GOAL #3   Title pt will be able to tolerate standing for >/= 10 minutes with </= 5/10 pain in the low back and bil LE's to promote function and ADLS (11/07/1094)   Baseline depends on the day   Time 4   Period Weeks   Status Partially Met     PT SHORT TERM GOAL #4   Title she will demonstrate decreased muscle spasm to promote hamstring flexibility by >/= 5 degrees on the L to promote increased mobility  (12/10/2016)   Time 4   Period Weeks   Status On-going           PT Long Term Goals - 12/07/16 1419      PT LONG TERM GOAL #1   Title Pt will be I with advanced HEP (01/04/17)   Time 8   Period Weeks   Status On-going     PT LONG TERM GOAL #2   Title she will increase bil hip strength to >/= 4/5 with </= 2/10 pain to promote  strength required for ADLS and prolonged walking / standing activities (01/04/17)   Time 8   Period Weeks   Status On-going     PT LONG TERM GOAL #3   Title she will increase L hamstring flexibility to >/= 65 degrees to assist with prolonged standing and walking with LRAD with </= 2/10 pain (01/04/17)   Time 8   Period Weeks   Status On-going     PT LONG TERM GOAL #4   Title She will will be able to walk/ stand for >/=15 minutes with LRAD to promote functional mobility and endurance required for community ambulation and ADLs (01/04/17)   Time 8   Period Weeks   Status On-going     PT LONG TERM GOAL #5   Title pt will increaese bil hip flexibility in thomas test position to </= 3 degrees of hip flexion bil to demonstrate reduced hip flexor tightness (01/04/17)   Time 8   Period Weeks   Status On-going               Plan - 12/14/16 1204    Clinical Impression Statement Lylian reported decreased soreness in her low back today, but that her hip pain is up to a 6/10. Focused on stretching and hip strengthening in supine. IASTM over hip flexor while on stretch after report of continued tightness. She reported no increase in pain during treatment. Declined modalities post session.    PT Next Visit Plan IASTM over hip flexors and quads, hip strengthening, core strengthening   PT Home Exercise Plan glute med / piriformis/ hip flexor stretching, clamshells, SLR, prone quad stretch   Consulted and Agree with Plan of Care Patient      Patient will benefit from skilled therapeutic intervention in order to improve the following deficits and impairments:  Pain, Improper body mechanics, Postural dysfunction, Impaired flexibility, Decreased strength, Decreased mobility, Impaired tone, Increased muscle spasms, Increased fascial restricitons, Abnormal gait, Decreased activity tolerance, Decreased endurance, Decreased balance, Difficulty walking, Decreased range of motion  Visit Diagnosis: Muscle  weakness (generalized)  Abnormal posture  Other abnormalities of gait and mobility  Stiffness of  left hip, not elsewhere classified  Stiffness of right hip, not elsewhere classified  Chronic bilateral low back pain, with sciatica presence unspecified  Pain in thoracic spine  Tight unbalanced musculature     Problem List There are no active problems to display for this patient.   Alda Ponder, SPT 12/14/2016, 12:12 PM  [facility] [address] [place], [place], [address] Phone: [phone]   Fax:  [phone]  Name: DAUNE DIVIRGILIO MRN: [id number] Date of Birth: [birth year]

## 2016-12-17 ENCOUNTER — Encounter: Payer: Medicare Other | Admitting: Physical Therapy

## 2016-12-17 ENCOUNTER — Ambulatory Visit: Payer: Medicare Other | Admitting: Physical Therapy

## 2016-12-17 DIAGNOSIS — M25652 Stiffness of left hip, not elsewhere classified: Secondary | ICD-10-CM

## 2016-12-17 DIAGNOSIS — M25651 Stiffness of right hip, not elsewhere classified: Secondary | ICD-10-CM

## 2016-12-17 DIAGNOSIS — R293 Abnormal posture: Secondary | ICD-10-CM

## 2016-12-17 DIAGNOSIS — M545 Low back pain: Secondary | ICD-10-CM

## 2016-12-17 DIAGNOSIS — R2689 Other abnormalities of gait and mobility: Secondary | ICD-10-CM

## 2016-12-17 DIAGNOSIS — G8929 Other chronic pain: Secondary | ICD-10-CM

## 2016-12-17 DIAGNOSIS — M6281 Muscle weakness (generalized): Secondary | ICD-10-CM | POA: Diagnosis not present

## 2016-12-17 NOTE — Patient Instructions (Signed)
Quadriped walking-  Arms are straight, head is up with eye looking across room and toes are under and sit back on feet. Tongue in the roof of your mouth and belly breath while doing all exercises. Rock as far over your shoulders as you can without losing your posture  A. Thinking and feeling how your shoulder blades are moving B. With time your increase in shoulder range of motion and control with abdominal control.    Also do 20 x forward and then clockwise and counter clockwise  3 sets of 20 daily. Ashlee [name], PT Exercise Expert for the Aging Adult  12/17/16 11:16 AM Phone: [phone] Fax: [phone]

## 2016-12-17 NOTE — Therapy (Signed)
[place], [place], [address] Phone: [phone]   Fax:  [phone]  Physical Therapy Treatment  Patient Details  Name: Ashlee [name] MRN: [id number] Date of Birth: [birth year] Referring Provider: Vivi Barrack MD  Encounter Date: 12/17/2016      PT End of Session - 12/17/16 1105    Visit Number 12   Date for PT Re-Evaluation 01/04/17   Authorization Type Medicare: Kx mod by 15th visit, progress note by 10th visit.    PT Start Time 1100   PT Stop Time 1200   PT Time Calculation (min) 60 min   Activity Tolerance Patient tolerated treatment well;Other (comment)  had valium prior to TDN   Behavior During Therapy [facility] for tasks assessed/performed      Past Medical History:  Diagnosis Date  . Allergy    some nuts, juniper bushes  . Anxiety   . Back pain   . GERD (gastroesophageal reflux disease)   . Migraines   . Mitral valve prolapse   . Patent foramen ovale   . Restless leg syndrome   . Rheumatoid arthritis(714.0)   . Sciatica   . Scoliosis   . Spinal cord disease [facility])     Past Surgical History:  Procedure Laterality Date  . BACK SURGERY    . CARDIAC CATHETERIZATION      There were no vitals filed for this visit.      Subjective Assessment - 12/17/16 1106    Subjective Dr. Glena Norfolk stated I can use my Diazepam only for anxiety pre TDN.  I think the TDN helps me, I am having more and more trouble with my right hip pain   Pertinent History SCI 2012   Limitations Sitting;Standing;Lifting;Walking;House hold activities   How long can you sit comfortably? unlimited   Diagnostic tests x-ray   Patient Stated Goals stretching out tight muscles in the LE, controlling pain,    Currently in Pain? Yes   Pain Score 3   sitting   Pain Location Back   Pain Orientation Left;Lower;Mid   Pain Descriptors / Indicators Aching;Burning   Pain Type Chronic pain   Pain Onset More than a month ago    Pain Frequency Constant   Multiple Pain Sites Yes   Pain Score 6   Pain Location Hip   Pain Orientation Right   Pain Descriptors / Indicators Burning;Aching   Pain Type Acute pain                         [facility] Adult PT Treatment/Exercise - 12/17/16 1105      Lumbar Exercises: Prone   Other Prone Lumbar Exercises quadriped rocking x 20 forward and clockwise/counterclockwise shoulder circles x 20     Manual Therapy   Manual Therapy Myofascial release;Soft tissue mobilization   Manual therapy comments deep tissue to iliopsoas strumming in supine   Soft tissue mobilization right gluteals and quadriceps/hamstring lateral right   Myofascial Release right quad /hamstring IASTYM tool          Trigger Point Dry Needling - 12/17/16 1137    Consent Given? Yes   Education Handout Provided No   Muscles Treated Upper Body Quadratus Lumborum  right Iliacus  all TDN on right only right L5 S1   Muscles Treated Lower Body Gluteus minimus;Gluteus maximus;Piriformis;Hamstring;Quadriceps   Gluteus Maximus Response Twitch response elicited;Palpable increased muscle length   Gluteus Minimus Response Palpable increased muscle length   Piriformis  Response Palpable increased muscle length   Quadriceps Response Twitch response elicited;Palpable increased muscle length   Hamstring Response Twitch response elicited;Palpable increased muscle length  right lateral              PT Education - 12/17/16 1138    Education provided Yes   Education Details Added to HEP with Quadriped rocking   Person(s) Educated Patient   Methods Explanation;Demonstration;Handout   Comprehension Verbalized understanding;Returned demonstration          PT Short Term Goals - 12/07/16 1401      PT SHORT TERM GOAL #1   Title pt will be I with inital HEP given (12/10/2016)   Time 4   Period Weeks   Status Achieved     PT SHORT TERM GOAL #2   Title pt will increase bil hip strength to >/=4-/5  with </=5/10 pain to promote functional strength (12/10/2016)   Baseline hip flex/ext 3/5   Time 4   Period Weeks   Status Partially Met     PT SHORT TERM GOAL #3   Title pt will be able to tolerate standing for >/= 10 minutes with </= 5/10 pain in the low back and bil LE's to promote function and ADLS (11/07/1094)   Baseline depends on the day   Time 4   Period Weeks   Status Partially Met     PT SHORT TERM GOAL #4   Title she will demonstrate decreased muscle spasm to promote hamstring flexibility by >/= 5 degrees on the L to promote increased mobility  (12/10/2016)   Time 4   Period Weeks   Status On-going           PT Long Term Goals - 12/07/16 1419      PT LONG TERM GOAL #1   Title Pt will be I with advanced HEP (01/04/17)   Time 8   Period Weeks   Status On-going     PT LONG TERM GOAL #2   Title she will increase bil hip strength to >/= 4/5 with </= 2/10 pain to promote strength required for ADLS and prolonged walking / standing activities (01/04/17)   Time 8   Period Weeks   Status On-going     PT LONG TERM GOAL #3   Title she will increase L hamstring flexibility to >/= 65 degrees to assist with prolonged standing and walking with LRAD with </= 2/10 pain (01/04/17)   Time 8   Period Weeks   Status On-going     PT LONG TERM GOAL #4   Title She will will be able to walk/ stand for >/=15 minutes with LRAD to promote functional mobility and endurance required for community ambulation and ADLs (01/04/17)   Time 8   Period Weeks   Status On-going     PT LONG TERM GOAL #5   Title pt will increaese bil hip flexibility in thomas test position to </= 3 degrees of hip flexion bil to demonstrate reduced hip flexor tightness (01/04/17)   Time 8   Period Weeks   Status On-going               Plan - 12/17/16 1255    Clinical Impression Statement Pt reported she has increase in low back and hip today but she does think she is benefitting from TDN. Pt does appear to stand  more erect post TDN and RX.  Pain prior to treatment  5-6/10. Pt took valium prior to TDN as prescribed  for anxiety prior to TDN.  Pt was CGA coming into clinic for safety.  Pt with supine stretch of quads over table with IASTYM tool.  Pt was closely monitored throughout treatment with TDN.  Palpable lengthening post TDN.   Rehab Potential Good   PT Frequency 3x / week   PT Duration 8 weeks   PT Treatment/Interventions ADLs/Self Care Home Management;Cryotherapy;Iontophoresis 59m/ml Dexamethasone;Electrical Stimulation;Moist Heat;Ultrasound;Gait training;Therapeutic activities;Therapeutic exercise;Patient/family education;Dry needling;Taping;Manual techniques;Balance training   PT Next Visit Plan IASTM over hip flexors and quads, hip strengthening, core strengthening, Assess goals.   PT Home Exercise Plan glute med / piriformis/ hip flexor stretching, clamshells, SLR, prone quad stretch, quadriped rocking   Consulted and Agree with Plan of Care Patient      Patient will benefit from skilled therapeutic intervention in order to improve the following deficits and impairments:  Pain, Improper body mechanics, Postural dysfunction, Impaired flexibility, Decreased strength, Decreased mobility, Impaired tone, Increased muscle spasms, Increased fascial restricitons, Abnormal gait, Decreased activity tolerance, Decreased endurance, Decreased balance, Difficulty walking, Decreased range of motion  Visit Diagnosis: Muscle weakness (generalized)  Abnormal posture  Other abnormalities of gait and mobility  Stiffness of left hip, not elsewhere classified  Stiffness of right hip, not elsewhere classified  Chronic bilateral low back pain, with sciatica presence unspecified     Problem List There are no active problems to display for this patient.   LVoncille Lo PT Exercise Expert for the Aging Adult  12/17/16 1:01 PM Phone: [phone]: 3[id number] [place] [address]Phone: [phone]  Fax:  [phone] Name: Ashlee ONSTADMRN: [id number] of Birth: [birth year]

## 2016-12-22 ENCOUNTER — Ambulatory Visit: Payer: Medicare Other | Admitting: Physical Therapy

## 2016-12-22 DIAGNOSIS — M6281 Muscle weakness (generalized): Secondary | ICD-10-CM | POA: Diagnosis not present

## 2016-12-22 DIAGNOSIS — R293 Abnormal posture: Secondary | ICD-10-CM

## 2016-12-22 DIAGNOSIS — R2689 Other abnormalities of gait and mobility: Secondary | ICD-10-CM

## 2016-12-22 DIAGNOSIS — M25651 Stiffness of right hip, not elsewhere classified: Secondary | ICD-10-CM

## 2016-12-22 DIAGNOSIS — M25652 Stiffness of left hip, not elsewhere classified: Secondary | ICD-10-CM

## 2016-12-22 DIAGNOSIS — M545 Low back pain: Secondary | ICD-10-CM

## 2016-12-22 DIAGNOSIS — G8929 Other chronic pain: Secondary | ICD-10-CM

## 2016-12-22 NOTE — Therapy (Addendum)
[place], [place], [address] Phone: [phone]   Fax:  [phone]  Physical Therapy Treatment  Patient Details  Name: Ashlee [name] MRN: [id number] Date of Birth: July 31, 1986 Referring Provider: Vivi Barrack MD  Encounter Date: 12/22/2016      PT End of Session - 12/22/16 1019    Visit Number 13   Number of Visits 24   Date for PT Re-Evaluation 01/04/17   Authorization Type Medicare: Kx mod by 15th visit, progress note by 10th visit.    PT Start Time 1018   PT Stop Time 1115   PT Time Calculation (min) 57 min   Activity Tolerance Patient tolerated treatment well;Other (comment)   Behavior During Therapy WFL for tasks assessed/performed      Past Medical History:  Diagnosis Date  . Allergy    some nuts, juniper bushes  . Anxiety   . Back pain   . GERD (gastroesophageal reflux disease)   . Migraines   . Mitral valve prolapse   . Patent foramen ovale   . Restless leg syndrome   . Rheumatoid arthritis(714.0)   . Sciatica   . Scoliosis   . Spinal cord disease [facility])     Past Surgical History:  Procedure Laterality Date  . BACK SURGERY    . CARDIAC CATHETERIZATION      There were no vitals filed for this visit.      Subjective Assessment - 12/22/16 1020    Subjective I am using a new walker I have had at home and I changed the back gliders on them.  I am on 108m tablets for the TDN.  I saw [facility], JMaureen ChattersMD [facility].  I am having NCV test on bil arms/shoulders .  Dr. MHassell Donehas a  RX to a physical medicine MD Dr CBaltazar Apofor trochanteric bursitis on right side evaluation and possible corticosteroid injection.   Pertinent History SCI 2012   Limitations Sitting;Standing;Lifting;Walking;House hold activities   How long can you sit comfortably? unlimited   Patient Stated Goals stretching out tight muscles in the LE, controlling pain,    Currently  in Pain? Yes   Pain Score 2    Pain Location Back   Pain Orientation Left;Right;Mid   Pain Descriptors / Indicators Aching;Burning   Pain Type Chronic pain   Pain Radiating Towards both legs   Pain Onset More than a month ago   Pain Frequency Constant   Multiple Pain Sites Yes   Pain Score 4   Pain Location Hip   Pain Orientation Right;Left   Pain Descriptors / Indicators Burning;Aching   Pain Type Chronic pain                         [facility] Adult PT Treatment/Exercise - 12/22/16 1020      Self-Care   Self-Care Other Self-Care Comments   Other Self-Care Comments  discussed home equipment for stretching. ( Adductor stretch/ use of straps and all leg stroop 3 ring D ring strap for utilizing with hip flex/quad stretching.   adjusted walker as much as possible walker is too tall     Knee/Hip Exercises: Stretches   Hip Flexor Stretch 3 reps;30 seconds   Other Knee/Hip Stretches adductor stretch 2 reps 60 second frog leg  pt has adductor stretching machine at home     Manual Therapy   Manual Therapy Myofascial release;Soft tissue mobilization  Manual therapy comments deep tissue to iliopsoas strumming in supine   Soft tissue mobilization right gluteals and quadriceps/hamstring lateral right and left adductors.    Myofascial Release right quad /hamstring IASTYM tool. right quadratus lumborum and supine iliopsoas           Trigger Point Dry Needling - 12/22/16 1035    Consent Given? Yes   Education Handout Provided No  previously given   Muscles Treated Upper Body Quadratus Lumborum;Gluteus minimus;Gluteus maximus;Hamstring;Quadriceps   Muscles Treated Lower Body Gluteus minimus;Gluteus maximus;Quadriceps;Hamstring;Adductor longus/brevius/maximus   Adductor Response Twitch response elicited;Palpable increased muscle length  left side only   Hamstring Response Twitch response elicited;Palpable increased muscle length  right only marked localized twitch  responses      right Quadratus Lumborum, Glut minimus, Glut med, Iliacus and Quad on the Right with palpable lengthening.  TWitch response on Iliacus.            PT Short Term Goals - 12/07/16 1401      PT SHORT TERM GOAL #1   Title pt will be I with inital HEP given (12/10/2016)   Time 4   Period Weeks   Status Achieved     PT SHORT TERM GOAL #2   Title pt will increase bil hip strength to >/=4-/5 with </=5/10 pain to promote functional strength (12/10/2016)   Baseline hip flex/ext 3/5   Time 4   Period Weeks   Status Partially Met     PT SHORT TERM GOAL #3   Title pt will be able to tolerate standing for >/= 10 minutes with </= 5/10 pain in the low back and bil LE's to promote function and ADLS (01/08/1828)   Baseline depends on the day   Time 4   Period Weeks   Status Partially Met     PT SHORT TERM GOAL #4   Title she will demonstrate decreased muscle spasm to promote hamstring flexibility by >/= 5 degrees on the L to promote increased mobility  (12/10/2016)   Time 4   Period Weeks   Status On-going           PT Long Term Goals - 12/22/16 1027      PT LONG TERM GOAL #1   Title Pt will be I with advanced HEP (01/04/17)   Time 8   Period Weeks   Status On-going     PT LONG TERM GOAL #2   Title she will increase bil hip strength to >/= 4/5 with </= 2/10 pain to promote strength required for ADLS and prolonged walking / standing activities (01/04/17)   Time 8   Period Weeks   Status On-going     PT LONG TERM GOAL #3   Title she will increase L hamstring flexibility to >/= 65 degrees to assist with prolonged standing and walking with LRAD with </= 2/10 pain (01/04/17)   Time 8   Period Weeks   Status On-going     PT LONG TERM GOAL #4   Title She will will be able to walk/ stand for >/=15 minutes with LRAD to promote functional mobility and endurance required for community ambulation and ADLs (01/04/17)   Time 8   Period Weeks   Status On-going     PT LONG TERM  GOAL #5   Title pt will increaese bil hip flexibility in thomas test position to </= 3 degrees of hip flexion bil to demonstrate reduced hip flexor tightness (01/04/17)   Time 8   Period Weeks  Status On-going               Plan - 12/22/16 1034    Clinical Impression Statement Pt has 2/10 back pain and 4/10 right hip pain today. Pt is also on 5 mg of valium.  She reports she saw [facility] who is referring her to Physical Medicine MD for referral for evaluation and assess trochanteric bursitis with possible  corticoid injection.   PT attempted MMT/AROM today but pt is feeling to week due to 5 mg valium and declined evaluation until Friday when she feels she will be stronger.  Pt recieved TDN and was closely monitored today throught out session.  Pt has several bruising one over right buttock from former needling.  but has several she reports from self use of foam roller.  discussed proper use of  foam roller.  Pt has palpable lengthening of quads and hamstrings post RX but PT feels that tightened right hip flexor minimally effected by treatment.  Pt spaciticity plays greater role in hip flexor tight ness but also provides her stability in standing combined with her back fusion and rods.  Will defer to further medical evaluation by  Dr Romualdo Bolk.    Rehab Potential Good   PT Frequency 2x / week   PT Duration 8 weeks   PT Treatment/Interventions ADLs/Self Care Home Management;Cryotherapy;Iontophoresis 57m/ml Dexamethasone;Electrical Stimulation;Moist Heat;Ultrasound;Gait training;Therapeutic activities;Therapeutic exercise;Patient/family education;Dry needling;Taping;Manual techniques;Balance training   PT Next Visit Plan Assess Goals/ measure AROM/strength. Pt on valium on Tuesday and preferred to test not on medication.   PT Home Exercise Plan glute med / piriformis/ hip flexor stretching, clamshells, SLR, prone quad stretch, quadriped rocking, adductor stretching   Consulted and Agree with  Plan of Care Patient      Patient will benefit from skilled therapeutic intervention in order to improve the following deficits and impairments:  Pain, Improper body mechanics, Postural dysfunction, Impaired flexibility, Decreased strength, Decreased mobility, Impaired tone, Increased muscle spasms, Increased fascial restricitons, Abnormal gait, Decreased activity tolerance, Decreased endurance, Decreased balance, Difficulty walking, Decreased range of motion  Visit Diagnosis: Muscle weakness (generalized)  Abnormal posture  Other abnormalities of gait and mobility  Stiffness of left hip, not elsewhere classified  Stiffness of right hip, not elsewhere classified  Chronic bilateral low back pain, with sciatica presence unspecified     Problem List There are no active problems to display for this patient.  LVoncille Lo PT Exercise Expert for the Aging Adult  12/22/16 11:27 AM Phone: [phone]: 3[facility] [address]Phone: [phone]  Fax:  [phone] Name: Ashlee UTTMRN: [id number] of Birth: [birth year]

## 2016-12-25 ENCOUNTER — Ambulatory Visit: Payer: Medicare Other | Admitting: Physical Therapy

## 2016-12-25 DIAGNOSIS — M546 Pain in thoracic spine: Secondary | ICD-10-CM

## 2016-12-25 DIAGNOSIS — M545 Low back pain: Secondary | ICD-10-CM

## 2016-12-25 DIAGNOSIS — R293 Abnormal posture: Secondary | ICD-10-CM

## 2016-12-25 DIAGNOSIS — M25652 Stiffness of left hip, not elsewhere classified: Secondary | ICD-10-CM

## 2016-12-25 DIAGNOSIS — M6281 Muscle weakness (generalized): Secondary | ICD-10-CM | POA: Diagnosis not present

## 2016-12-25 DIAGNOSIS — M25651 Stiffness of right hip, not elsewhere classified: Secondary | ICD-10-CM

## 2016-12-25 DIAGNOSIS — G8929 Other chronic pain: Secondary | ICD-10-CM

## 2016-12-25 DIAGNOSIS — R2689 Other abnormalities of gait and mobility: Secondary | ICD-10-CM

## 2016-12-25 NOTE — Therapy (Signed)
[place], [place], [address] Phone: [phone]   Fax:  [phone]  Physical Therapy Treatment  Patient Details  Name: Ashlee [name] MRN: [id number] Date of Birth: 1986/05/03 Referring Provider: Vivi Barrack MD  Encounter Date: 12/25/2016      PT End of Session - 12/25/16 1034    Visit Number 14   Number of Visits 24   Date for PT Re-Evaluation 01/04/17   PT Start Time 1016   PT Stop Time 1102   PT Time Calculation (min) 46 min   Activity Tolerance Patient tolerated treatment well   Behavior During Therapy [facility] for tasks assessed/performed      Past Medical History:  Diagnosis Date  . Allergy    some nuts, juniper bushes  . Anxiety   . Back pain   . GERD (gastroesophageal reflux disease)   . Migraines   . Mitral valve prolapse   . Patent foramen ovale   . Restless leg syndrome   . Rheumatoid arthritis(714.0)   . Sciatica   . Scoliosis   . Spinal cord disease [facility])     Past Surgical History:  Procedure Laterality Date  . BACK SURGERY    . CARDIAC CATHETERIZATION      There were no vitals filed for this visit.      Subjective Assessment - 12/25/16 1018    Subjective "Since last session with the TPDN I haven been feeling a little looser"    Currently in Pain? Yes   Pain Score 3    Pain Location Back   Pain Orientation Right;Left;Mid            [facility] PT Assessment - 12/25/16 0001      AROM   Right Hip Flexion 94   Right Hip ABduction 30   Left Hip Flexion 78   Left Hip ABduction 26     Strength   Right Hip Flexion 4-/5   Right Hip Extension 3+/5   Right Hip ABduction 4/5   Right Hip ADduction 4/5   Left Hip Flexion 3+/5   Left Hip Extension 4-/5   Left Hip ABduction 4/5   Left Hip ADduction 4/5   Right Knee Flexion 5/5   Right Knee Extension 5/5   Left Knee Flexion 4+/5   Left Knee Extension 5/5     Flexibility   Hamstrings R 59 degrees and L70  degrees with SLR     Thomas Test    Findings Positive   Comments 9 degrees flexion on the L, 13 degrees on the R                     [facility] Adult PT Treatment/Exercise - 12/25/16 1019      Lumbar Exercises: Aerobic   Stationary Bike Nu-step L3 x 10 min     Knee/Hip Exercises: Stretches   Hip Flexor Stretch 3 reps;30 seconds   Other Knee/Hip Stretches adductor stretch 2 reps 60 second frog leg     Knee/Hip Exercises: Seated   Other Seated Knee/Hip Exercises hip ER 2 x 10 ea. with ball between knees using yellow band     Manual Therapy   Manual therapy comments deep tissue to iliopsoas strumming in supine                PT Education - 12/25/16 1059    Education provided Yes   Education Details reviewed stretching and continued consistency. discussed with pt with neurologic  involvement muscle tone is a constant battle.    Person(s) Educated Patient   Methods Explanation;Verbal cues   Comprehension Verbalized understanding;Verbal cues required          PT Short Term Goals - 12/25/16 1044      PT SHORT TERM GOAL #1   Title pt will be I with inital HEP given (12/10/2016)   Time 4   Period Weeks   Status Achieved     PT SHORT TERM GOAL #2   Title pt will increase bil hip strength to >/=4-/5 with </=5/10 pain to promote functional strength (12/10/2016)   Time 4   Period Weeks   Status Partially Met     PT SHORT TERM GOAL #3   Title pt will be able to tolerate standing for >/= 10 minutes with </= 5/10 pain in the low back and bil LE's to promote function and ADLS (12/10/2016)   Baseline can stand longer than 10 minutes but still >5/10 pain   Time 4   Period Weeks   Status Partially Met     PT SHORT TERM GOAL #4   Title she will demonstrate decreased muscle spasm to promote hamstring flexibility by >/= 5 degrees on the L to promote increased mobility  (12/10/2016)   Time 4   Period Weeks   Status Achieved           PT Long Term Goals - 12/25/16  1048      PT LONG TERM GOAL #1   Title Pt will be I with advanced HEP (01/04/17)   Time 8   Period Weeks   Status On-going     PT LONG TERM GOAL #2   Title she will increase bil hip strength to >/= 4/5 with </= 2/10 pain to promote strength required for ADLS and prolonged walking / standing activities (01/04/17)   Time 8   Period Weeks   Status On-going     PT LONG TERM GOAL #3   Title she will increase L hamstring flexibility to >/= 65 degrees to assist with prolonged standing and walking with LRAD with </= 2/10 pain (01/04/17)   Baseline still having pain >2/10   Time 8   Period Weeks   Status On-going     PT LONG TERM GOAL #4   Title She will will be able to walk/ stand for >/=15 minutes with LRAD to promote functional mobility and endurance required for community ambulation and ADLs (01/04/17)   Baseline cannot do without pain   Time 8   Period Weeks   Status On-going     PT LONG TERM GOAL #5   Title pt will increaese bil hip flexibility in thomas test position to </= 3 degrees of hip flexion bil to demonstrate reduced hip flexor tightness (01/04/17)   Baseline -9 degrees on L, 13 degrees on R   Time 8   Period Weeks   Status On-going               Plan - 12/25/16 1056    Clinical Impression Statement Ashlee [name] has demonstrate improvement in strength and hip mobility. she is progressing with goals but continues to have pain and tightness in the hip flexors. She reports planning on seeing a physiatrist to assess the trochanteric region. continued working on soft tissue work focusing on hip flexors/ abductors bil, and aggressive stretching. pt declined heat post session. ,   PT Next Visit Plan continued manual focusing on ITB and muscle attachments, stretching, strengthening.  PT Home Exercise Plan glute med / piriformis/ hip flexor stretching, clamshells, SLR, prone quad stretch, quadriped rocking, adductor stretching   Consulted and Agree with Plan of Care Patient       Patient will benefit from skilled therapeutic intervention in order to improve the following deficits and impairments:  Pain, Improper body mechanics, Postural dysfunction, Impaired flexibility, Decreased strength, Decreased mobility, Impaired tone, Increased muscle spasms, Increased fascial restricitons, Abnormal gait, Decreased activity tolerance, Decreased endurance, Decreased balance, Difficulty walking, Decreased range of motion  Visit Diagnosis: Muscle weakness (generalized)  Abnormal posture  Other abnormalities of gait and mobility  Stiffness of left hip, not elsewhere classified  Chronic bilateral low back pain, with sciatica presence unspecified  Stiffness of right hip, not elsewhere classified  Pain in thoracic spine     Problem List There are no active problems to display for this patient.  Starr Lake PT, DPT, LAT, ATC  12/25/16  11:03 AM      [facility] [address] [place], [place], [address] Phone: [phone]   Fax:  [phone]  Name: Ashlee [name] MRN: [id number] Date of Birth: Sep 11, 1986

## 2016-12-28 ENCOUNTER — Ambulatory Visit: Payer: Medicare Other | Admitting: Physical Therapy

## 2016-12-28 DIAGNOSIS — M25652 Stiffness of left hip, not elsewhere classified: Secondary | ICD-10-CM

## 2016-12-28 DIAGNOSIS — M25651 Stiffness of right hip, not elsewhere classified: Secondary | ICD-10-CM

## 2016-12-28 DIAGNOSIS — M545 Low back pain: Secondary | ICD-10-CM

## 2016-12-28 DIAGNOSIS — M546 Pain in thoracic spine: Secondary | ICD-10-CM

## 2016-12-28 DIAGNOSIS — M6281 Muscle weakness (generalized): Secondary | ICD-10-CM | POA: Diagnosis not present

## 2016-12-28 DIAGNOSIS — G8929 Other chronic pain: Secondary | ICD-10-CM

## 2016-12-28 DIAGNOSIS — R2689 Other abnormalities of gait and mobility: Secondary | ICD-10-CM

## 2016-12-28 DIAGNOSIS — R293 Abnormal posture: Secondary | ICD-10-CM

## 2016-12-28 NOTE — Therapy (Signed)
[place], [place], [address] Phone: [phone]   Fax:  [phone]  Physical Therapy Treatment  Patient Details  Name: Ashlee [name] MRN: [id number] Date of Birth: [birth year] Referring Provider: Vivi Barrack MD  Encounter Date: 12/28/2016      PT End of Session - 12/28/16 1238    Visit Number 15   Number of Visits 24   Date for PT Re-Evaluation 01/04/17   Authorization Type Medicare: Kx mod by 15th visit,    PT Start Time 1145   PT Stop Time 1240   PT Time Calculation (min) 55 min   Activity Tolerance Patient tolerated treatment well   Behavior During Therapy [facility] for tasks assessed/performed      Past Medical History:  Diagnosis Date  . Allergy    some nuts, juniper bushes  . Anxiety   . Back pain   . GERD (gastroesophageal reflux disease)   . Migraines   . Mitral valve prolapse   . Patent foramen ovale   . Restless leg syndrome   . Rheumatoid arthritis(714.0)   . Sciatica   . Scoliosis   . Spinal cord disease [facility])     Past Surgical History:  Procedure Laterality Date  . BACK SURGERY    . CARDIAC CATHETERIZATION      There were no vitals filed for this visit.      Subjective Assessment - 12/28/16 1153    Subjective "I've noticed some numbness in the L knee with some spasm/ twitching, I am not sure if I shoulder be concerned"   Currently in Pain? Yes   Pain Score 5    Pain Location Back   Pain Orientation Left;Right;Mid   Pain Descriptors / Indicators Aching;Burning   Pain Type Chronic pain   Pain Onset More than a month ago   Pain Frequency Constant   Aggravating Factors  prolonged standing/ walking   Pain Relieving Factors heat, medication Resting   Pain Score 5   Pain Location Hip   Pain Orientation Right;Left   Pain Type Chronic pain   Pain Onset More than a month ago   Pain Frequency Intermittent   Aggravating Factors  movement    Pain Relieving Factors  resting, medication, heat                         [facility] Adult PT Treatment/Exercise - 12/28/16 0001      Lumbar Exercises: Stretches   Single Knee to Chest Stretch 2 reps;30 seconds     Lumbar Exercises: Aerobic   Stationary Bike Nu-step L4 x 10 min  LE only     Knee/Hip Exercises: Stretches   Sports administrator 3 reps;30 seconds   Hip Flexor Stretch 3 reps;30 seconds   Other Knee/Hip Stretches glute Med/ min stretching.      Moist Heat Therapy   Number Minutes Moist Heat 10 Minutes   Moist Heat Location Hip     Manual Therapy   Manual therapy comments deep tissue to iliopsoas strumming in supine, manual trigger point release x 4 along the R vastus lateralis   Soft tissue mobilization IASTM over the glute medisu, TFL, glute max, and vastus lateralis on the R                  PT Short Term Goals - 12/25/16 1044      PT SHORT TERM GOAL #1   Title pt will be I  with inital HEP given (12/10/2016)   Time 4   Period Weeks   Status Achieved     PT SHORT TERM GOAL #2   Title pt will increase bil hip strength to >/=4-/5 with </=5/10 pain to promote functional strength (12/10/2016)   Time 4   Period Weeks   Status Partially Met     PT SHORT TERM GOAL #3   Title pt will be able to tolerate standing for >/= 10 minutes with </= 5/10 pain in the low back and bil LE's to promote function and ADLS (04/10/6294)   Baseline can stand longer than 10 minutes but still >5/10 pain   Time 4   Period Weeks   Status Partially Met     PT SHORT TERM GOAL #4   Title she will demonstrate decreased muscle spasm to promote hamstring flexibility by >/= 5 degrees on the L to promote increased mobility  (12/10/2016)   Time 4   Period Weeks   Status Achieved           PT Long Term Goals - 12/25/16 1048      PT LONG TERM GOAL #1   Title Pt will be I with advanced HEP (01/04/17)   Time 8   Period Weeks   Status On-going     PT LONG TERM GOAL #2   Title she will increase bil  hip strength to >/= 4/5 with </= 2/10 pain to promote strength required for ADLS and prolonged walking / standing activities (01/04/17)   Time 8   Period Weeks   Status On-going     PT LONG TERM GOAL #3   Title she will increase L hamstring flexibility to >/= 65 degrees to assist with prolonged standing and walking with LRAD with </= 2/10 pain (01/04/17)   Baseline still having pain >2/10   Time 8   Period Weeks   Status On-going     PT LONG TERM GOAL #4   Title She will will be able to walk/ stand for >/=15 minutes with LRAD to promote functional mobility and endurance required for community ambulation and ADLs (01/04/17)   Baseline cannot do without pain   Time 8   Period Weeks   Status On-going     PT LONG TERM GOAL #5   Title pt will increaese bil hip flexibility in thomas test position to </= 3 degrees of hip flexion bil to demonstrate reduced hip flexor tightness (01/04/17)   Baseline -9 degrees on L, 13 degrees on R   Time 8   Period Weeks   Status On-going               Plan - 12/28/16 1235    Clinical Impression Statement focused todays session on manual over the R hip/ quad to relieve tension at the greater trochanter on the R. she reported decreased tightness post session during standing/ walking.   PT Next Visit Plan continued manual focusing on ITB and muscle attachments, stretching, strengthening.    PT Home Exercise Plan glute med / piriformis/ hip flexor stretching, clamshells, SLR, prone quad stretch, quadriped rocking, adductor stretching   Consulted and Agree with Plan of Care Patient      Patient will benefit from skilled therapeutic intervention in order to improve the following deficits and impairments:  Pain, Improper body mechanics, Postural dysfunction, Impaired flexibility, Decreased strength, Decreased mobility, Impaired tone, Increased muscle spasms, Increased fascial restricitons, Abnormal gait, Decreased activity tolerance, Decreased endurance,  Decreased balance, Difficulty walking,  Decreased range of motion  Visit Diagnosis: Muscle weakness (generalized)  Abnormal posture  Other abnormalities of gait and mobility  Stiffness of left hip, not elsewhere classified  Chronic bilateral low back pain, with sciatica presence unspecified  Stiffness of right hip, not elsewhere classified  Pain in thoracic spine     Problem List There are no active problems to display for this patient.  Starr Lake PT, DPT, LAT, ATC  12/28/16  12:40 PM      [facility] [address] [place], [place], [address] Phone: [phone]   Fax:  [phone]  Name: Ashlee [name] MRN: [id number] Date of Birth: 01/09/1986

## 2016-12-31 ENCOUNTER — Ambulatory Visit: Payer: Medicare Other | Attending: *Deleted | Admitting: Physical Therapy

## 2016-12-31 DIAGNOSIS — M6281 Muscle weakness (generalized): Secondary | ICD-10-CM | POA: Diagnosis not present

## 2016-12-31 DIAGNOSIS — R293 Abnormal posture: Secondary | ICD-10-CM | POA: Diagnosis present

## 2016-12-31 DIAGNOSIS — G8929 Other chronic pain: Secondary | ICD-10-CM

## 2016-12-31 DIAGNOSIS — R2689 Other abnormalities of gait and mobility: Secondary | ICD-10-CM | POA: Insufficient documentation

## 2016-12-31 DIAGNOSIS — M25651 Stiffness of right hip, not elsewhere classified: Secondary | ICD-10-CM | POA: Insufficient documentation

## 2016-12-31 DIAGNOSIS — M545 Low back pain: Secondary | ICD-10-CM | POA: Diagnosis present

## 2016-12-31 DIAGNOSIS — M25652 Stiffness of left hip, not elsewhere classified: Secondary | ICD-10-CM | POA: Insufficient documentation

## 2016-12-31 NOTE — Therapy (Signed)
[facility] [address] [place], [place], [address] Phone: [phone]   Fax:  [phone]  Physical Therapy Treatment  Patient Details  Name: Ashlee [name] MRN: [id number] Date of Birth: 01-16-1986 Referring Provider: Desma Maxim MD  Encounter Date: 12/31/2016      PT End of Session - 12/31/16 1138    Visit Number 16   Number of Visits 24   Date for PT Re-Evaluation 01/04/17   Authorization Type Medicare: Kx mod by 15th visit,    PT Start Time 1137   PT Stop Time 1230   PT Time Calculation (min) 53 min   Activity Tolerance Patient tolerated treatment well   Behavior During Therapy [facility] for tasks assessed/performed      Past Medical History:  Diagnosis Date  . Allergy    some nuts, juniper bushes  . Anxiety   . Back pain   . GERD (gastroesophageal reflux disease)   . Migraines   . Mitral valve prolapse   . Patent foramen ovale   . Restless leg syndrome   . Rheumatoid arthritis(714.0)   . Sciatica   . Scoliosis   . Spinal cord disease [facility])     Past Surgical History:  Procedure Laterality Date  . BACK SURGERY    . CARDIAC CATHETERIZATION      There were no vitals filed for this visit.      Subjective Assessment - 12/31/16 1143    Subjective I feel like the needling is helping pain in my right hip and legs, , I do have some sacral pain on the right today.  I really need help stretching. I can't do it by myself.  I do have an adductor stretching machine but it is difficult to lift my approximately 26 pound  leg.    Pertinent History SCI 2012   Limitations Sitting;Standing;Lifting;Walking;House hold activities   Diagnostic tests x-ray   Patient Stated Goals stretching out tight muscles in the LE, controlling pain,    Currently in Pain? Yes   Pain Score 3   took valium pre dry needling   Pain Location Back   Pain Orientation Left;Right;Mid   Pain Descriptors / Indicators Aching;Burning   Pain  Type Chronic pain   Pain Radiating Towards both legs   Pain Onset More than a month ago   Pain Frequency Constant   Aggravating Factors  prolonged standing/walking   Pain Relieving Factors heat, meds, rest   Multiple Pain Sites Yes   Pain Score 3   Pain Location Hip   Pain Orientation Right  left is not painful   Pain Descriptors / Indicators Burning;Aching   Pain Onset More than a month ago   Pain Frequency Intermittent   Pain Score 3   Pain Location Sacrum   Pain Orientation Right   Pain Descriptors / Indicators Aching;Burning   Pain Type Acute pain   Pain Onset More than a month ago   Pain Frequency Intermittent   Aggravating Factors  standing on it/ walking                         [facility] Adult PT Treatment/Exercise - 12/31/16 1334      Knee/Hip Exercises: Stretches   Hip Flexor Stretch 3 reps;30 seconds   Hip Flexor Stretch Limitations bil  right > Left   Other Knee/Hip Stretches glute Med/ min stretching.      Knee/Hip Exercises: Supine   Other Supine Knee/Hip Exercises  PROM of right hip in all planes     Moist Heat Therapy   Number Minutes Moist Heat 10 Minutes   Moist Heat Location Hip;Lumbar Spine;Knee     Manual Therapy   Manual Therapy Myofascial release;Soft tissue mobilization   Manual therapy comments deep tissue to iliopsoas /iliacusstrumming in supine   Soft tissue mobilization right gluteals and quadriceps/hamstring lateral right and left adductors.    Myofascial Release bilquad /hamstring IASTYM tool. right quadratus lumborum and supine iliopsoas           Trigger Point Dry Needling - 12/31/16 1237    Consent Given? Yes   Education Handout Provided No   Muscles Treated Upper Body Quadratus Lumborum;Gluteus maximus;Gluteus minimus;Quadriceps;Hamstring;Adductor longus/brevius/magnus  iliacus on right, lumbosacral area right   Gluteus Maximus Response Palpable increased muscle length  right only   Gluteus Minimus Response Palpable  increased muscle length  right only   Piriformis Response Palpable increased muscle length  right only   Quadriceps Response Palpable increased muscle length  bil   Adductor Response Twitch response elicited  right side   Hamstring Response Palpable increased muscle length  bil                PT Short Term Goals - 12/25/16 1044      PT SHORT TERM GOAL #1   Title pt will be I with inital HEP given (12/10/2016)   Time 4   Period Weeks   Status Achieved     PT SHORT TERM GOAL #2   Title pt will increase bil hip strength to >/=4-/5 with </=5/10 pain to promote functional strength (12/10/2016)   Time 4   Period Weeks   Status Partially Met     PT SHORT TERM GOAL #3   Title pt will be able to tolerate standing for >/= 10 minutes with </= 5/10 pain in the low back and bil LE's to promote function and ADLS (03/11/7415)   Baseline can stand longer than 10 minutes but still >5/10 pain   Time 4   Period Weeks   Status Partially Met     PT SHORT TERM GOAL #4   Title she will demonstrate decreased muscle spasm to promote hamstring flexibility by >/= 5 degrees on the L to promote increased mobility  (12/10/2016)   Time 4   Period Weeks   Status Achieved           PT Long Term Goals - 12/25/16 1048      PT LONG TERM GOAL #1   Title Pt will be I with advanced HEP (01/04/17)   Time 8   Period Weeks   Status On-going     PT LONG TERM GOAL #2   Title she will increase bil hip strength to >/= 4/5 with </= 2/10 pain to promote strength required for ADLS and prolonged walking / standing activities (01/04/17)   Time 8   Period Weeks   Status On-going     PT LONG TERM GOAL #3   Title she will increase L hamstring flexibility to >/= 65 degrees to assist with prolonged standing and walking with LRAD with </= 2/10 pain (01/04/17)   Baseline still having pain >2/10   Time 8   Period Weeks   Status On-going     PT LONG TERM GOAL #4   Title She will will be able to walk/ stand for  >/=15 minutes with LRAD to promote functional mobility and endurance required for community ambulation and ADLs (  01/04/17)   Baseline cannot do without pain   Time 8   Period Weeks   Status On-going     PT LONG TERM GOAL #5   Title pt will increaese bil hip flexibility in thomas test position to </= 3 degrees of hip flexion bil to demonstrate reduced hip flexor tightness (01/04/17)   Baseline -9 degrees on L, 13 degrees on R   Time 8   Period Weeks   Status On-going               Plan - 12/31/16 1240    Clinical Impression Statement Ms. Lewing presents today with 3/10 back, right hip and right sacral pain.  Pt consented to trigger point dry needling today and has 2 additional appt scheduled for next week.  Pt has schedule an appt with a physiatrist at [facility] to investigate about any other treatments for pain and also for lengthening hip flexors and whther it would be advisable to do so with her SCI injury of 2012.  Pt lives in public housing and is able to live alone.  She has an adductor stretching machine at home which she utilizes. Next week will include her 6th and final trigger point dry needling appt. and will have maximized benefit for now with trigger point dry needling.  Pt has decreased tightness over iliacus/  Pt presented with more sacral pain today at 3/10 on the right.  Pt has undergone aggressive stretching with trigger point dry needling these past few weeks.  Pt also with right deep bursitis pain in right.  utilized kinesiotape for support over right hip greater trochanter.  Willl continue to solidify HEP for home use /self stretching.   Rehab Potential Good   PT Frequency 2x / week   PT Duration 8 weeks   PT Treatment/Interventions ADLs/Self Care Home Management;Cryotherapy;Iontophoresis '4mg'$ /ml Dexamethasone;Electrical Stimulation;Moist Heat;Ultrasound;Gait training;Therapeutic activities;Therapeutic exercise;Patient/family education;Dry needling;Taping;Manual techniques;Balance  training   PT Next Visit Plan FOTO?/ 2 more visits before DC unless extending continued manual focusing on ITB and muscle attachments, stretching, strengthening.    PT Home Exercise Plan glute med / piriformis/ hip flexor stretching, clamshells, SLR, prone quad stretch, quadriped rocking, adductor stretching   Consulted and Agree with Plan of Care Patient      Patient will benefit from skilled therapeutic intervention in order to improve the following deficits and impairments:  Pain, Improper body mechanics, Postural dysfunction, Impaired flexibility, Decreased strength, Decreased mobility, Impaired tone, Increased muscle spasms, Increased fascial restricitons, Abnormal gait, Decreased activity tolerance, Decreased endurance, Decreased balance, Difficulty walking, Decreased range of motion  Visit Diagnosis: Muscle weakness (generalized)  Abnormal posture  Other abnormalities of gait and mobility  Stiffness of left hip, not elsewhere classified  Chronic bilateral low back pain, with sciatica presence unspecified  Stiffness of right hip, not elsewhere classified     Problem List There are no active problems to display for this patient.   Ashlee [name] 12/31/2016, 1:39 PM  [facility] [address] [place], [place], [address] Phone: [phone]   Fax:  [phone]  Name: Ashlee [name] MRN: [id number] Date of Birth: January 29, 1986

## 2017-01-04 ENCOUNTER — Ambulatory Visit: Payer: Medicare Other | Admitting: Physical Therapy

## 2017-01-07 ENCOUNTER — Ambulatory Visit: Payer: Medicare Other | Admitting: Physical Therapy

## 2017-01-07 DIAGNOSIS — M6281 Muscle weakness (generalized): Secondary | ICD-10-CM

## 2017-01-07 DIAGNOSIS — M25651 Stiffness of right hip, not elsewhere classified: Secondary | ICD-10-CM

## 2017-01-07 DIAGNOSIS — R2689 Other abnormalities of gait and mobility: Secondary | ICD-10-CM

## 2017-01-07 DIAGNOSIS — G8929 Other chronic pain: Secondary | ICD-10-CM

## 2017-01-07 DIAGNOSIS — R293 Abnormal posture: Secondary | ICD-10-CM

## 2017-01-07 DIAGNOSIS — M25652 Stiffness of left hip, not elsewhere classified: Secondary | ICD-10-CM

## 2017-01-07 DIAGNOSIS — M545 Low back pain: Secondary | ICD-10-CM

## 2017-01-07 NOTE — Therapy (Addendum)
[place], [place], [address] Phone: [phone]   Fax:  [phone]  Physical Therapy Treatment/ Recertification/Dishcharge Note  Patient Details  Name: Ashlee [name] MRN: [id number] Date of Birth: 04/06/1986 Referring Provider: Vivi Barrack MD  Encounter Date: 01/07/2017      PT End of Session - 01/07/17 1151    Visit Number 17   Number of Visits 24   Date for PT Re-Evaluation 02/04/17  original 01/04/17   Authorization Type Medicare: Kx mod by 15th visit,    PT Start Time 1102   PT Stop Time 1200   PT Time Calculation (min) 58 min   Activity Tolerance Patient tolerated treatment well   Behavior During Therapy [facility] for tasks assessed/performed      Past Medical History:  Diagnosis Date  . Allergy    some nuts, juniper bushes  . Anxiety   . Back pain   . GERD (gastroesophageal reflux disease)   . Migraines   . Mitral valve prolapse   . Patent foramen ovale   . Restless leg syndrome   . Rheumatoid arthritis(714.0)   . Sciatica   . Scoliosis   . Spinal cord disease [facility])     Past Surgical History:  Procedure Laterality Date  . BACK SURGERY    . CARDIAC CATHETERIZATION      There were no vitals filed for this visit.      Subjective Assessment - 01/07/17 1110    Subjective I feel like my pain level is a 02/04/09, I am going to physiatrist next Friday at [facility].  I feel like my legs are going numb when I sit at the kitchen table chair.  I am frustrated that I cannot stretch myself at home where I live alone. and that my pain is still present.  I know I will have pain I just want to live with bearable pain.   Pertinent History SCI 2012   Limitations Sitting;Standing;Lifting;Walking;House hold activities   How long can you sit comfortably? unlimited in the reclinier but kitchen chair has numbness 15 - 68mnutes   How long can you stand comfortably? 10-15 min   How long can you walk  comfortably? 10 min   Diagnostic tests x-ray   Patient Stated Goals stretching out tight muscles in the LE, controlling pain,    Currently in Pain? Yes   Pain Score 5    Pain Location Back   Pain Orientation Right;Left;Mid   Pain Descriptors / Indicators Aching;Burning   Pain Type Chronic pain   Pain Onset More than a month ago   Pain Frequency Constant   Multiple Pain Sites Yes   Pain Score 5   Pain Location Hip   Pain Orientation Right   Pain Descriptors / Indicators Burning;Aching   Pain Type Chronic pain   Pain Onset More than a month ago   Pain Frequency Intermittent   Pain Score 3   Pain Location Sacrum   Pain Orientation Right   Pain Descriptors / Indicators Aching;Burning   Pain Type Acute pain   Pain Onset More than a month ago   Pain Frequency Intermittent            [facility] PT Assessment - 01/07/17 1119      AROM   Right Hip Flexion 94   Right Hip ABduction 30   Left Hip Flexion 74   Left Hip ABduction 25     PROM   Left Hip Flexion 105  Left Hip ABduction 21     Strength   Right Hip Flexion 4-/5   Right Hip Extension 3-/5   Right Hip ABduction 4/5   Right Hip ADduction 4/5   Left Hip Flexion 3+/5   Left Hip Extension 3+/5   Left Hip ABduction 4/5   Left Hip ADduction 4/5   Right Knee Flexion 5/5   Right Knee Extension 5/5   Left Knee Flexion 4+/5   Left Knee Extension 5/5     Flexibility   Hamstrings R 60 degrees and L70 degrees with SLR     Thomas Test    Findings Positive   Comments 8 degrees flexion on the L, 13 degrees on the R                     [facility] Adult PT Treatment/Exercise - 01/07/17 1146      Electrical Stimulation   Electrical Stimulation Location left rectus femoris   Electrical Stimulation Action IFC   Electrical Stimulation Parameters CPS 20   Electrical Stimulation Goals Other (comment)  to fatigue for 10 minutes attended with trigger point needli     Manual Therapy   Soft tissue mobilization right  gluteals/quadratus lumborum    Myofascial Release IASTYM tool to right quad          Trigger Point Dry Needling - 01/07/17 1111    Consent Given? Yes   Education Handout Provided No   Muscles Treated Upper Body Quadriceps;Quadratus Lumborum  right side only   Gluteus Maximus Response Twitch response elicited;Palpable increased muscle length   Gluteus Minimus Response Twitch response elicited;Palpable increased muscle length   Quadriceps Response Twitch response elicited;Palpable increased muscle length  with e stim attended 10 minutes right knee              PT Education - 01/07/17 1356    Education provided Yes   Education Details discussed differences in neurological vs musculoskeletal pain, discussed ways to          PT Short Term Goals - 12/25/16 1044      PT SHORT TERM GOAL #1   Title pt will be I with inital HEP given (12/10/2016)   Time 4   Period Weeks   Status Achieved     PT SHORT TERM GOAL #2   Title pt will increase bil hip strength to >/=4-/5 with </=5/10 pain to promote functional strength (12/10/2016)   Time 4   Period Weeks   Status Partially Met     PT SHORT TERM GOAL #3   Title pt will be able to tolerate standing for >/= 10 minutes with </= 5/10 pain in the low back and bil LE's to promote function and ADLS (12/10/2016)   Baseline can stand longer than 10 minutes but still >5/10 pain   Time 4   Period Weeks   Status Partially Met     PT SHORT TERM GOAL #4   Title she will demonstrate decreased muscle spasm to promote hamstring flexibility by >/= 5 degrees on the L to promote increased mobility  (12/10/2016)   Time 4   Period Weeks   Status Achieved           PT Long Term Goals - 01/07/17 1112      PT LONG TERM GOAL #1   Title Pt will be I with advanced HEP (02/04/17)   Time 4   Period Weeks   Status On-going     PT LONG TERM  GOAL #2   Title she will increase bil hip strength to >/= 4/5 with </= 2/10 pain to promote strength required  for ADLS and prolonged walking / standing activities (02/04/17)   Baseline pt with hip flex 4-/5 on right and ext 3-/5 on right See flowsheet  pain is 5/10 in back and hip, 3/10 on right sacrum   Time 4   Period Weeks   Status On-going     PT LONG TERM GOAL #3   Title she will increase L hamstring flexibility to >/= 65 degrees to assist with prolonged standing and walking with LRAD with </= 2/10 pain (02/04/17)   Baseline still having pain > than 2-3/10  back 5/10, hip 5/10  sacrum today 3/10   Time 4   Period Weeks   Status On-going     PT LONG TERM GOAL #4   Title She will will be able to walk/ stand for >/=15 minutes with LRAD to promote functional mobility and endurance required for community ambulation and ADLs (02/04/17)   Baseline 10 minutes but cannot do without pain   Time 4   Period Weeks   Status Not Met     PT LONG TERM GOAL #5   Title pt will increaese bil hip flexibility in thomas test position to </= 3 degrees of hip flexion bil to demonstrate reduced hip flexor tightness (02/04/17)   Baseline -8 degrees on L, 13 degrees on R   Time 8   Period Weeks   Status On-going     Additional Long Term Goals   Additional Long Term Goals Yes     PT LONG TERM GOAL #6   Title Pt will be able to perform home exercise program for stretching or be able to communicate to caregiver    Baseline Pt unable to lengthen hamstrings on her own   Time 4   Period Weeks   Status On-going               Plan - 01/07/17 1155    Clinical Impression Statement Ashlee [name] presents today with 5/10 back, right hip and right sacral pain at 3/10.  Pt has fluctuated in pain and is limited by deficits from Incomplete SCI in 2012.  Pt feels like her IT band and Hip pain are reduced from evaulation and she has found value in attending PT althouth gains in AROM are minimal.  Pt has neurological deficits requiring her to utilize AFO's bil and a walker for ambulation. She often does not wear her AFO's and is  less stable and at risk for fall which she has been reminded consistently.   Pt has scheudles an appt with a physiatrist at [facility]  March 16th to investigate any other treatments for pain and for lengthening hip flexors and whether it would be advisable to do so with her SCI injurty of 2012.  Pt has been utilizing trigger point dry needling and IASTYM tools to help elongate Right hip flexors and Right Quad and IT band musculature during PT sessions.  Main problem is pt is unable to stretch maximally on her own due to living alone and is not consistent about stretching herself because of weakness and limitation.  ( See flow sheet for MMT deficits and hip flexor contracture). Pt has maximum of  5 additional visits left for year  that she would like to use to problem solve ways to stretch herself at home unless her MD at [facility] has more beneficial solution. for her  to solve pain problem and hip flexor contracture. Due to major LE's weakness, pt is at risk for contractures to worsen around hips due to her unable to stand greater than 10 to 15 minutes at a time because of pain.  Will continue and progress as reasonable if necessary  after she visits [facility] MD.      Patient will benefit from skilled therapeutic intervention in order to improve the following deficits and impairments:  Pain, Improper body mechanics, Postural dysfunction, Impaired flexibility, Decreased strength, Decreased mobility, Impaired tone, Increased muscle spasms, Increased fascial restricitons, Abnormal gait, Decreased activity tolerance, Decreased endurance, Decreased balance, Difficulty walking, Decreased range of motion  Visit Diagnosis: Muscle weakness (generalized)  Abnormal posture  Other abnormalities of gait and mobility  Stiffness of left hip, not elsewhere classified  Chronic bilateral low back pain, with sciatica presence unspecified  Stiffness of right hip, not elsewhere classified     Problem List There are no active  problems to display for this patient.  Ashlee [name], PT Certified Exercise Expert for the Aging Adult  01/07/17 5:31 PM Phone: [phone] Fax: [phone]  PT G code  Mobility and walking  And moving around 01-07-17 Goal status  CK Discharge Status CL  [facility] [address] [place], [place], [address] Phone: [phone]   Fax:  [phone]  Name: AMETHYST GAINER MRN: [id number] Date of Birth: 10-23-1986    PHYSICAL THERAPY DISCHARGE SUMMARY  Visits from Start of Care: 17  Current functional level related to goals / functional outcomes: As above,  Pt seen at [facility] and cancelled all appt   Remaining deficits: As above   Education / Equipment: HEP and as above Plan: Patient agrees to discharge.  Patient goals were partially met. Patient is being discharged due to the physician's request.  ?????    Pt is going to pursue care at [facility] and seeing specialist regarding spasticity.  Pt has cancelled all future appt due to pursuing care at another facility. MD asked to cancel all future appt at [place], PT Certified Exercise Expert for the Aging Adult  01/21/17 3:01 PM Phone: [phone] Fax: [phone]

## 2017-01-12 ENCOUNTER — Ambulatory Visit: Payer: Medicare Other | Admitting: Physical Therapy

## 2017-01-19 ENCOUNTER — Encounter: Payer: Medicare Other | Admitting: Physical Therapy

## 2017-01-21 ENCOUNTER — Ambulatory Visit: Payer: Medicare Other | Admitting: Physical Therapy

## 2017-01-26 ENCOUNTER — Ambulatory Visit: Payer: Medicare Other | Admitting: Physical Therapy

## 2017-01-26 ENCOUNTER — Encounter: Payer: Medicare Other | Admitting: Physical Therapy

## 2017-01-28 ENCOUNTER — Encounter: Payer: Medicare Other | Admitting: Physical Therapy

## 2017-02-02 ENCOUNTER — Encounter: Payer: Medicare Other | Admitting: Physical Therapy

## 2017-02-04 ENCOUNTER — Encounter: Payer: Medicare Other | Admitting: Physical Therapy

## 2017-02-09 ENCOUNTER — Encounter: Payer: Medicare Other | Admitting: Physical Therapy

## 2017-02-18 ENCOUNTER — Encounter: Payer: Medicare Other | Admitting: Physical Therapy

## 2017-05-07 ENCOUNTER — Encounter: Payer: Self-pay | Admitting: Physical Therapy

## 2017-12-08 ENCOUNTER — Other Ambulatory Visit: Payer: Self-pay

## 2017-12-08 ENCOUNTER — Ambulatory Visit (HOSPITAL_COMMUNITY)
Admission: EM | Admit: 2017-12-08 | Discharge: 2017-12-08 | Disposition: A | Discharge status: Home / Self Care | Payer: Medicare Other | Attending: Family Medicine | Admitting: Family Medicine

## 2017-12-08 ENCOUNTER — Encounter (HOSPITAL_COMMUNITY): Payer: Self-pay | Admitting: Emergency Medicine

## 2017-12-08 ENCOUNTER — Ambulatory Visit (INDEPENDENT_AMBULATORY_CARE_PROVIDER_SITE_OTHER): Payer: Medicare Other

## 2017-12-08 DIAGNOSIS — W19XXXA Unspecified fall, initial encounter: Secondary | ICD-10-CM

## 2017-12-08 DIAGNOSIS — M5489 Other dorsalgia: Secondary | ICD-10-CM | POA: Diagnosis not present

## 2017-12-08 DIAGNOSIS — R51 Headache: Secondary | ICD-10-CM

## 2017-12-08 DIAGNOSIS — M542 Cervicalgia: Secondary | ICD-10-CM | POA: Diagnosis not present

## 2017-12-08 DIAGNOSIS — R519 Headache, unspecified: Secondary | ICD-10-CM

## 2017-12-08 MED ORDER — METOCLOPRAMIDE HCL 5 MG/ML IJ SOLN
5.0000 mg | Freq: Once | INTRAMUSCULAR | Status: AC
  Administered 2017-12-08: 5 mg via INTRAMUSCULAR

## 2017-12-08 MED ORDER — KETOROLAC TROMETHAMINE 60 MG/2ML IM SOLN
60.0000 mg | Freq: Once | INTRAMUSCULAR | Status: AC
  Administered 2017-12-08: 60 mg via INTRAMUSCULAR

## 2017-12-08 MED ORDER — METOCLOPRAMIDE HCL 5 MG/ML IJ SOLN
INTRAMUSCULAR | Status: AC
  Filled 2017-12-08: qty 2

## 2017-12-08 MED ORDER — DEXAMETHASONE SODIUM PHOSPHATE 10 MG/ML IJ SOLN
10.0000 mg | Freq: Once | INTRAMUSCULAR | Status: AC
Start: 2017-12-08 — End: 2017-12-08
  Administered 2017-12-08: 10 mg via INTRAMUSCULAR

## 2017-12-08 MED ORDER — DEXAMETHASONE SODIUM PHOSPHATE 10 MG/ML IJ SOLN
INTRAMUSCULAR | Status: AC
  Filled 2017-12-08: qty 1

## 2017-12-08 MED ORDER — KETOROLAC TROMETHAMINE 60 MG/2ML IM SOLN
INTRAMUSCULAR | Status: AC
  Filled 2017-12-08: qty 2

## 2017-12-08 NOTE — ED Triage Notes (Signed)
Pt has hx of scoliosis and paraplegic, pt takes dilaudid for chronic back pain.

## 2017-12-08 NOTE — Discharge Instructions (Signed)
Today for your headache we gave you injections of Toradol, Reglan and Decadron.  For your headache moving forward please do Tylenol and ibuprofen or Aleve.  Please return if you start to have any changes in vision, severe worsening headache, one-sided weakness, difficulty speaking, slurring of speech, chest pain or shortness of breath.  X-ray of your lumbar spine showed no acute changes from previous x-rays.  X-ray of your spine also did not show any new changes.   Please continue to treat your pain in accordance with your pain contract.  Please follow-up with your primary care doctor if symptoms persisting.
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
## 2018-08-02 ENCOUNTER — Ambulatory Visit (HOSPITAL_COMMUNITY)
Admission: EM | Admit: 2018-08-02 | Discharge: 2018-08-02 | Disposition: A | Discharge status: Home / Self Care | Payer: Medicare Other | Attending: Family Medicine | Admitting: Family Medicine

## 2018-08-02 ENCOUNTER — Encounter (HOSPITAL_COMMUNITY): Payer: Self-pay

## 2018-08-02 ENCOUNTER — Other Ambulatory Visit: Payer: Self-pay

## 2018-08-02 DIAGNOSIS — L03032 Cellulitis of left toe: Secondary | ICD-10-CM | POA: Diagnosis not present

## 2018-08-02 MED ORDER — SILVER NITRATE-POT NITRATE 75-25 % EX MISC
CUTANEOUS | Status: AC
  Filled 2018-08-02: qty 1

## 2018-08-02 MED ORDER — CEPHALEXIN 500 MG PO CAPS: 500.0000 mg | ORAL_CAPSULE | Freq: Three times a day (TID) | ORAL | 0 refills | Status: DC

## 2018-08-02 NOTE — Discharge Instructions (Addendum)
Warm soaks Take antibiotic as instructed Expect improvement over the nest week or so Return as needed

## 2018-08-02 NOTE — ED Triage Notes (Signed)
Pt states she was trying to removed some skin around her toe nail and now it hurts a lot x 6 days.

## 2018-08-02 NOTE — ED Provider Notes (Signed)
[facility]    CSN: [id number] Arrival date & time: 08/02/18  1854     History   Chief Complaint Chief Complaint  Patient presents with  . Nail Problem    HPI Ashlee [name] is a 32 y.o. female.   HPI  Patient trim a callus around her toe nail to close about 6 days ago.  It is her left great toe.  Now she has a swollen bump the side of her toe and redness around the edge.  Very painful.  Slight drainage.  No fever. 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  She states that she can feel the pain in spite of this. No fever or chills, no malaise.  Past Medical History:  Diagnosis Date  . Allergy    some nuts, juniper bushes  . Anxiety   . Back pain   . GERD (gastroesophageal reflux disease)   . Migraines   . Mitral valve prolapse   . Patent foramen ovale   . Restless leg syndrome   . Rheumatoid arthritis(714.0)   . Sciatica   . Scoliosis   . Spinal cord disease (HCC)     There are no active problems to display for this patient.   Past Surgical History:  Procedure Laterality Date  . BACK SURGERY    . CARDIAC CATHETERIZATION      OB History   None      Home Medications    Prior to Admission medications   Medication Sig Start Date End Date Taking? Authorizing Provider  acetaminophen (TYLENOL) 325 MG tablet Take 325 mg by mouth every 6 (six) hours as needed for pain.     [provider]  amphetamine-dextroamphetamine (ADDERALL XR) 25 MG 24 hr capsule Take 25 mg by mouth every morning.    [provider]  amphetamine-dextroamphetamine (ADDERALL) 10 MG tablet Take 10 mg by mouth every evening.     [provider]  aspirin 81 MG chewable tablet Chew 81 mg by mouth daily.    [provider]  baclofen (LIORESAL) 10 MG tablet Take 30-40 mg by mouth 3 (three) times daily. 4 tablet in the morning 3 tablets in the afternoon 4 tablets at bedtime    [provider]  cephALEXin (KEFLEX) 500 MG capsule Take 1 capsule (500 mg total) by mouth 3 (three) times daily. 08/02/18   Eustace Moore, MD  ferrous sulfate 325 (65 FE) MG tablet Take 1 tablet by mouth daily.    [provider]  folic acid (FOLVITE) 1 MG tablet Take 1 mg by mouth daily. 12/11/14   [provider]  gabapentin (NEURONTIN) 300 MG capsule Take 900 mg by mouth 3 (three) times daily. AM: 600, 300, pm 900    [provider]  HYDROmorphone (DILAUDID) 2 MG tablet Take 2 mg by mouth 2 (two) times daily as needed for moderate pain or severe pain.    [provider]  HYDROmorphone (DILAUDID) 4 MG tablet Take 2 mg by mouth 3 (three) times daily as needed. For pain    [provider]  hydroxychloroquine (PLAQUENIL) 200 MG tablet Take 200 mg by mouth 2 (two) times daily.     [provider]  methotrexate (RHEUMATREX) 2.5 MG tablet Take 10 mg by mouth once a week. Tuesday Caution:Chemotherapy. Protect from light.    [provider]  naproxen (NAPROSYN) 500 MG tablet Take 500  mg by mouth 2 (two) times daily.     [provider]  pregabalin (LYRICA) 100 MG capsule Take 100 mg by mouth 3 (three) times daily.    [provider]  SUMAtriptan (IMITREX) 25 MG tablet Take 25 mg by mouth daily as needed for migraine. For migraine    [provider]  tiZANidine (ZANAFLEX) 4 MG tablet Take 4 mg by mouth 3 (three) times daily.    [provider]  traMADol (ULTRAM) 50 MG tablet Take 50-100 mg by mouth 3 (three) times daily as needed for moderate pain.     [provider]  traMADol (ULTRAM-ER) 100 MG 24 hr tablet Take 100 mg by mouth 3 (three) times daily.    [provider]    Family History History reviewed. No pertinent family history.  Social History Social History   Tobacco Use  . Smoking status: Never Smoker  . Smokeless tobacco: Never Used  Substance Use Topics  . Alcohol use:  No  . Drug use: No     Allergies   Tape   Review of Systems Review of Systems  Constitutional: Negative for chills and fever.  HENT: Negative for ear pain and sore throat.   Eyes: Negative for pain and visual disturbance.  Respiratory: Negative for cough and shortness of breath.   Cardiovascular: Negative for chest pain and palpitations.  Gastrointestinal: Negative for abdominal pain and vomiting.  Genitourinary: Negative for dysuria and hematuria.  Musculoskeletal: Positive for back pain and gait problem. Negative for arthralgias.  Skin: Positive for wound. Negative for color change and rash.  Neurological: Negative for seizures and syncope.  All other systems reviewed and are negative.  Procedure: The foot was soaked for 10 minutes in soapy water.  The area was cleaned.  The proud flesh was coated with silver nitrate.  Allowed to dry.  Coated again.  Band-Aid was placed.  Physical Exam Triage Vital Signs ED Triage Vitals  Enc Vitals Group     BP 08/02/18 1937 134/72     Pulse Rate 08/02/18 1937 90     Resp 08/02/18 1937 18     Temp 08/02/18 1937 99.2 F (37.3 C)     Temp Source 08/02/18 1937 Oral     SpO2 08/02/18 1937 100 %     Weight 08/02/18 1944 140 lb (63.5 kg)   No data found.  Updated Vital Signs BP 134/72 (BP Location: Right Arm)   Pulse 90   Temp 99.2 F (37.3 C) (Oral)   Resp 18   Wt 63.5 kg   LMP 07/19/2018   SpO2 100%   BMI 25.61 kg/m         UC Treatments / Results  Labs (all labs ordered are listed, but only abnormal results are displayed) Labs Reviewed - No data to display  EKG None  Radiology No results found.  Procedures Procedures (including critical care time)  Medications Ordered in UC Medications - No data to display  Initial Impression / Assessment and Plan / UC Course  I have reviewed the triage vital signs and the nursing notes.  Pertinent labs & imaging results that were available during my care of the patient were  reviewed by me and considered in my medical decision making (see chart for details).     Wound care discussed.  Proper toenail trimming discussed.  Antibiotic discussed.  Return as needed Final Clinical Impressions(s) / UC Diagnoses   Final diagnoses:  Paronychia of great toe, left  Discharge Instructions     Warm soaks Take antibiotic as instructed Expect improvement over the nest week or so Return as needed   ED Prescriptions    Medication Sig Dispense Auth. Provider   cephALEXin (KEFLEX) 500 MG capsule Take 1 capsule (500 mg total) by mouth 3 (three) times daily. 21 capsule Eustace Moore, MD     Controlled Substance Prescriptions  Controlled Substance Registry consulted? Not Applicable  Eustace Moore, MD 08/02/18 2017

## 2019-11-30 ENCOUNTER — Inpatient Hospital Stay (HOSPITAL_COMMUNITY)
Admission: EM | Admit: 2019-11-30 | Discharge: 2019-12-02 | DRG: 091 | Disposition: A | Discharge status: Home / Self Care | Payer: Medicare Other | Attending: Internal Medicine | Admitting: Internal Medicine

## 2019-11-30 ENCOUNTER — Emergency Department (HOSPITAL_COMMUNITY): Payer: Medicare Other

## 2019-11-30 ENCOUNTER — Encounter (HOSPITAL_COMMUNITY): Payer: Self-pay | Admitting: Emergency Medicine

## 2019-11-30 DIAGNOSIS — S301XXA Contusion of abdominal wall, initial encounter: Secondary | ICD-10-CM | POA: Diagnosis present

## 2019-11-30 DIAGNOSIS — U071 COVID-19: Secondary | ICD-10-CM

## 2019-11-30 DIAGNOSIS — S0031XA Abrasion of nose, initial encounter: Secondary | ICD-10-CM | POA: Diagnosis present

## 2019-11-30 DIAGNOSIS — S01511A Laceration without foreign body of lip, initial encounter: Secondary | ICD-10-CM | POA: Diagnosis present

## 2019-11-30 DIAGNOSIS — S2020XA Contusion of thorax, unspecified, initial encounter: Secondary | ICD-10-CM | POA: Diagnosis present

## 2019-11-30 DIAGNOSIS — G2581 Restless legs syndrome: Secondary | ICD-10-CM | POA: Diagnosis present

## 2019-11-30 DIAGNOSIS — B20 Human immunodeficiency virus [HIV] disease: Secondary | ICD-10-CM

## 2019-11-30 DIAGNOSIS — M4800 Spinal stenosis, site unspecified: Secondary | ICD-10-CM

## 2019-11-30 DIAGNOSIS — R75 Inconclusive laboratory evidence of human immunodeficiency virus [HIV]: Secondary | ICD-10-CM | POA: Diagnosis present

## 2019-11-30 DIAGNOSIS — S40022A Contusion of left upper arm, initial encounter: Secondary | ICD-10-CM | POA: Diagnosis present

## 2019-11-30 DIAGNOSIS — G92 Toxic encephalopathy: Principal | ICD-10-CM | POA: Diagnosis present

## 2019-11-30 DIAGNOSIS — Z7982 Long term (current) use of aspirin: Secondary | ICD-10-CM

## 2019-11-30 DIAGNOSIS — Z791 Long term (current) use of non-steroidal anti-inflammatories (NSAID): Secondary | ICD-10-CM

## 2019-11-30 DIAGNOSIS — Y900 Blood alcohol level of less than 20 mg/100 ml: Secondary | ICD-10-CM | POA: Diagnosis present

## 2019-11-30 DIAGNOSIS — F419 Anxiety disorder, unspecified: Secondary | ICD-10-CM | POA: Diagnosis present

## 2019-11-30 DIAGNOSIS — Z981 Arthrodesis status: Secondary | ICD-10-CM

## 2019-11-30 DIAGNOSIS — X58XXXA Exposure to other specified factors, initial encounter: Secondary | ICD-10-CM | POA: Diagnosis present

## 2019-11-30 DIAGNOSIS — Y939 Activity, unspecified: Secondary | ICD-10-CM

## 2019-11-30 DIAGNOSIS — R400 Somnolence: Secondary | ICD-10-CM | POA: Diagnosis not present

## 2019-11-30 DIAGNOSIS — M069 Rheumatoid arthritis, unspecified: Secondary | ICD-10-CM

## 2019-11-30 DIAGNOSIS — R569 Unspecified convulsions: Secondary | ICD-10-CM

## 2019-11-30 DIAGNOSIS — M419 Scoliosis, unspecified: Secondary | ICD-10-CM | POA: Diagnosis present

## 2019-11-30 DIAGNOSIS — M549 Dorsalgia, unspecified: Secondary | ICD-10-CM | POA: Diagnosis present

## 2019-11-30 DIAGNOSIS — Z79899 Other long term (current) drug therapy: Secondary | ICD-10-CM

## 2019-11-30 DIAGNOSIS — E875 Hyperkalemia: Secondary | ICD-10-CM

## 2019-11-30 DIAGNOSIS — G9340 Encephalopathy, unspecified: Secondary | ICD-10-CM

## 2019-11-30 DIAGNOSIS — Y92009 Unspecified place in unspecified non-institutional (private) residence as the place of occurrence of the external cause: Secondary | ICD-10-CM

## 2019-11-30 DIAGNOSIS — G8929 Other chronic pain: Secondary | ICD-10-CM

## 2019-11-30 LAB — CBC WITH DIFFERENTIAL/PLATELET
Abs Immature Granulocytes: 0.09 10*3/uL — ABNORMAL HIGH (ref 0.00–0.07)
Basophils Absolute: 0 10*3/uL (ref 0.0–0.1)
Basophils Relative: 0 %
Eosinophils Absolute: 0 10*3/uL (ref 0.0–0.5)
Eosinophils Relative: 0 %
HCT: 49.1 % — ABNORMAL HIGH (ref 36.0–46.0)
Hemoglobin: 16.5 g/dL — ABNORMAL HIGH (ref 12.0–15.0)
Immature Granulocytes: 1 %
Lymphocytes Relative: 2 %
Lymphs Abs: 0.4 10*3/uL — ABNORMAL LOW (ref 0.7–4.0)
MCH: 32.2 pg (ref 26.0–34.0)
MCHC: 33.6 g/dL (ref 30.0–36.0)
MCV: 95.7 fL (ref 80.0–100.0)
Monocytes Absolute: 0.4 10*3/uL (ref 0.1–1.0)
Monocytes Relative: 2 %
Neutro Abs: 17.1 10*3/uL — ABNORMAL HIGH (ref 1.7–7.7)
Neutrophils Relative %: 95 %
Platelets: 288 10*3/uL (ref 150–400)
RBC: 5.13 MIL/uL — ABNORMAL HIGH (ref 3.87–5.11)
RDW: 12.3 % (ref 11.5–15.5)
WBC: 18 10*3/uL — ABNORMAL HIGH (ref 4.0–10.5)
nRBC: 0 % (ref 0.0–0.2)

## 2019-11-30 LAB — COMPREHENSIVE METABOLIC PANEL
ALT: 21 U/L (ref 0–44)
AST: 25 U/L (ref 15–41)
Albumin: 4.2 g/dL (ref 3.5–5.0)
Alkaline Phosphatase: 116 U/L (ref 38–126)
Anion gap: 9 (ref 5–15)
BUN: 21 mg/dL — ABNORMAL HIGH (ref 6–20)
CO2: 24 mmol/L (ref 22–32)
Calcium: 9.3 mg/dL (ref 8.9–10.3)
Chloride: 105 mmol/L (ref 98–111)
Creatinine, Ser: 0.79 mg/dL (ref 0.44–1.00)
GFR calc Af Amer: >60 mL/min (ref >60)
GFR calc non Af Amer: >60 mL/min (ref >60)
Glucose, Bld: 155 mg/dL — ABNORMAL HIGH (ref 70–99)
Potassium: 6.7 mmol/L (ref 3.5–5.1)
Sodium: 138 mmol/L (ref 135–145)
Total Bilirubin: 0.7 mg/dL (ref 0.3–1.2)
Total Protein: 7.1 g/dL (ref 6.5–8.1)

## 2019-11-30 LAB — POCT I-STAT EG7
Acid-base deficit: 2 mmol/L (ref 0.0–2.0)
Bicarbonate: 25.1 mmol/L (ref 20.0–28.0)
Calcium, Ion: 1.15 mmol/L (ref 1.15–1.40)
HCT: 48 % — ABNORMAL HIGH (ref 36.0–46.0)
Hemoglobin: 16.3 g/dL — ABNORMAL HIGH (ref 12.0–15.0)
O2 Saturation: 86 %
Potassium: 6.5 mmol/L (ref 3.5–5.1)
Sodium: 138 mmol/L (ref 135–145)
TCO2: 27 mmol/L (ref 22–32)
pCO2, Ven: 48.6 mmHg (ref 44.0–60.0)
pH, Ven: 7.322 (ref 7.250–7.430)
pO2, Ven: 56 mmHg — ABNORMAL HIGH (ref 32.0–45.0)

## 2019-11-30 LAB — TYPE AND SCREEN
ABO/RH(D): A POS
Antibody Screen: NEGATIVE

## 2019-11-30 LAB — URINALYSIS, COMPLETE (UACMP) WITH MICROSCOPIC
Bilirubin Urine: NEGATIVE
Glucose, UA: 150 mg/dL — AB
Ketones, ur: NEGATIVE mg/dL
Leukocytes,Ua: NEGATIVE
Nitrite: POSITIVE — AB
Protein, ur: NEGATIVE mg/dL
Specific Gravity, Urine: 1.031 — ABNORMAL HIGH (ref 1.005–1.030)
pH: 6 (ref 5.0–8.0)

## 2019-11-30 LAB — TSH: TSH: 8.883 u[IU]/mL — ABNORMAL HIGH (ref 0.350–4.500)

## 2019-11-30 LAB — PROTIME-INR
INR: 1 (ref 0.8–1.2)
Prothrombin Time: 12.8 seconds (ref 11.4–15.2)

## 2019-11-30 LAB — POC SARS CORONAVIRUS 2 AG -  ED: SARS Coronavirus 2 Ag: POSITIVE — AB

## 2019-11-30 LAB — ABO/RH: ABO/RH(D): A POS

## 2019-11-30 LAB — CBG MONITORING, ED
Glucose-Capillary: 96 mg/dL (ref 70–99)
Glucose-Capillary: 82 mg/dL (ref 70–99)

## 2019-11-30 LAB — I-STAT BETA HCG BLOOD, ED (MC, WL, AP ONLY): I-stat hCG, quantitative: <5 m[IU]/mL (ref <5)

## 2019-11-30 LAB — RAPID URINE DRUG SCREEN, HOSP PERFORMED
Amphetamines: POSITIVE — AB
Barbiturates: NOT DETECTED
Benzodiazepines: NOT DETECTED
Cocaine: NOT DETECTED
Opiates: POSITIVE — AB
Tetrahydrocannabinol: NOT DETECTED

## 2019-11-30 LAB — LACTIC ACID, PLASMA: Lactic Acid, Venous: 1.3 mmol/L (ref 0.5–1.9)

## 2019-11-30 LAB — AMMONIA: Ammonia: 18 umol/L (ref 9–35)

## 2019-11-30 LAB — T4, FREE: Free T4: 1.34 ng/dL — ABNORMAL HIGH (ref 0.61–1.12)

## 2019-11-30 LAB — CK: Total CK: 187 U/L (ref 38–234)

## 2019-11-30 LAB — ETHANOL: Alcohol, Ethyl (B): <10 mg/dL (ref <10)

## 2019-11-30 MED ORDER — SODIUM CHLORIDE 0.9 % IV BOLUS
1000.0000 mL | Freq: Once | INTRAVENOUS | Status: AC
  Administered 2019-11-30: 1000 mL via INTRAVENOUS

## 2019-11-30 MED ORDER — CALCIUM GLUCONATE 10 % IV SOLN
1.0000 g | Freq: Once | INTRAVENOUS | Status: AC
  Administered 2019-11-30: 1 g via INTRAVENOUS
  Filled 2019-11-30: qty 10

## 2019-11-30 MED ORDER — ONDANSETRON HCL 4 MG/2ML IJ SOLN
INTRAMUSCULAR | Status: AC
  Administered 2019-11-30: 4 mg
  Filled 2019-11-30: qty 2

## 2019-11-30 MED ORDER — SODIUM BICARBONATE 8.4 % IV SOLN
50.0000 meq | Freq: Once | INTRAVENOUS | Status: AC
  Administered 2019-11-30: 50 meq via INTRAVENOUS
  Filled 2019-11-30: qty 50

## 2019-11-30 MED ORDER — SODIUM CHLORIDE 0.9 % IV SOLN: INTRAVENOUS | Status: DC

## 2019-11-30 MED ORDER — DEXTROSE 50 % IV SOLN
1.0000 | Freq: Once | INTRAVENOUS | Status: AC
  Administered 2019-11-30: 50 mL via INTRAVENOUS
  Filled 2019-11-30: qty 50

## 2019-11-30 MED ORDER — THIAMINE HCL 100 MG/ML IJ SOLN
100.0000 mg | Freq: Once | INTRAMUSCULAR | Status: AC
  Administered 2019-11-30: 100 mg via INTRAVENOUS
  Filled 2019-11-30: qty 2

## 2019-11-30 MED ORDER — IOHEXOL 350 MG/ML SOLN
100.0000 mL | Freq: Once | INTRAVENOUS | Status: AC | PRN
  Administered 2019-11-30: 100 mL via INTRAVENOUS

## 2019-11-30 MED ORDER — INSULIN ASPART 100 UNIT/ML IV SOLN
10.0000 [IU] | Freq: Once | INTRAVENOUS | Status: AC
  Administered 2019-11-30: 10 [IU] via INTRAVENOUS
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
## 2019-12-01 ENCOUNTER — Other Ambulatory Visit: Payer: Self-pay

## 2019-12-01 DIAGNOSIS — R400 Somnolence: Secondary | ICD-10-CM | POA: Diagnosis present

## 2019-12-01 DIAGNOSIS — G9340 Encephalopathy, unspecified: Secondary | ICD-10-CM | POA: Diagnosis not present

## 2019-12-01 DIAGNOSIS — M419 Scoliosis, unspecified: Secondary | ICD-10-CM | POA: Diagnosis present

## 2019-12-01 DIAGNOSIS — U071 COVID-19: Secondary | ICD-10-CM | POA: Diagnosis not present

## 2019-12-01 DIAGNOSIS — G8929 Other chronic pain: Secondary | ICD-10-CM

## 2019-12-01 DIAGNOSIS — Y939 Activity, unspecified: Secondary | ICD-10-CM | POA: Diagnosis not present

## 2019-12-01 DIAGNOSIS — X58XXXA Exposure to other specified factors, initial encounter: Secondary | ICD-10-CM | POA: Diagnosis present

## 2019-12-01 DIAGNOSIS — M4800 Spinal stenosis, site unspecified: Secondary | ICD-10-CM | POA: Diagnosis present

## 2019-12-01 DIAGNOSIS — Z791 Long term (current) use of non-steroidal anti-inflammatories (NSAID): Secondary | ICD-10-CM | POA: Diagnosis not present

## 2019-12-01 DIAGNOSIS — M069 Rheumatoid arthritis, unspecified: Secondary | ICD-10-CM | POA: Diagnosis not present

## 2019-12-01 DIAGNOSIS — Z981 Arthrodesis status: Secondary | ICD-10-CM | POA: Diagnosis not present

## 2019-12-01 DIAGNOSIS — F419 Anxiety disorder, unspecified: Secondary | ICD-10-CM | POA: Diagnosis present

## 2019-12-01 DIAGNOSIS — S40022A Contusion of left upper arm, initial encounter: Secondary | ICD-10-CM | POA: Diagnosis present

## 2019-12-01 DIAGNOSIS — E875 Hyperkalemia: Secondary | ICD-10-CM | POA: Diagnosis present

## 2019-12-01 DIAGNOSIS — Z7982 Long term (current) use of aspirin: Secondary | ICD-10-CM | POA: Diagnosis not present

## 2019-12-01 DIAGNOSIS — S0031XA Abrasion of nose, initial encounter: Secondary | ICD-10-CM | POA: Diagnosis present

## 2019-12-01 DIAGNOSIS — Y900 Blood alcohol level of less than 20 mg/100 ml: Secondary | ICD-10-CM | POA: Diagnosis present

## 2019-12-01 DIAGNOSIS — M549 Dorsalgia, unspecified: Secondary | ICD-10-CM | POA: Diagnosis present

## 2019-12-01 DIAGNOSIS — Z79899 Other long term (current) drug therapy: Secondary | ICD-10-CM | POA: Diagnosis not present

## 2019-12-01 DIAGNOSIS — R75 Inconclusive laboratory evidence of human immunodeficiency virus [HIV]: Secondary | ICD-10-CM | POA: Diagnosis present

## 2019-12-01 DIAGNOSIS — R569 Unspecified convulsions: Secondary | ICD-10-CM | POA: Diagnosis present

## 2019-12-01 DIAGNOSIS — G2581 Restless legs syndrome: Secondary | ICD-10-CM | POA: Diagnosis present

## 2019-12-01 DIAGNOSIS — S301XXA Contusion of abdominal wall, initial encounter: Secondary | ICD-10-CM | POA: Diagnosis present

## 2019-12-01 DIAGNOSIS — Y92009 Unspecified place in unspecified non-institutional (private) residence as the place of occurrence of the external cause: Secondary | ICD-10-CM | POA: Diagnosis not present

## 2019-12-01 DIAGNOSIS — G92 Toxic encephalopathy: Secondary | ICD-10-CM | POA: Diagnosis present

## 2019-12-01 DIAGNOSIS — S01511A Laceration without foreign body of lip, initial encounter: Secondary | ICD-10-CM | POA: Diagnosis present

## 2019-12-01 DIAGNOSIS — S2020XA Contusion of thorax, unspecified, initial encounter: Secondary | ICD-10-CM | POA: Diagnosis present

## 2019-12-01 LAB — CBC
HCT: 40.1 % (ref 36.0–46.0)
Hemoglobin: 13.3 g/dL (ref 12.0–15.0)
MCH: 31.7 pg (ref 26.0–34.0)
MCHC: 33.2 g/dL (ref 30.0–36.0)
MCV: 95.5 fL (ref 80.0–100.0)
Platelets: 264 10*3/uL (ref 150–400)
RBC: 4.2 MIL/uL (ref 3.87–5.11)
RDW: 12.4 % (ref 11.5–15.5)
WBC: 12.3 10*3/uL — ABNORMAL HIGH (ref 4.0–10.5)
nRBC: 0 % (ref 0.0–0.2)

## 2019-12-01 LAB — COMPREHENSIVE METABOLIC PANEL
ALT: 18 U/L (ref 0–44)
AST: 23 U/L (ref 15–41)
Albumin: 3.4 g/dL — ABNORMAL LOW (ref 3.5–5.0)
Alkaline Phosphatase: 80 U/L (ref 38–126)
Anion gap: 7 (ref 5–15)
BUN: 17 mg/dL (ref 6–20)
CO2: 25 mmol/L (ref 22–32)
Calcium: 8.6 mg/dL — ABNORMAL LOW (ref 8.9–10.3)
Chloride: 111 mmol/L (ref 98–111)
Creatinine, Ser: 0.71 mg/dL (ref 0.44–1.00)
GFR calc Af Amer: >60 mL/min (ref >60)
GFR calc non Af Amer: >60 mL/min (ref >60)
Glucose, Bld: 105 mg/dL — ABNORMAL HIGH (ref 70–99)
Potassium: 4.1 mmol/L (ref 3.5–5.1)
Sodium: 143 mmol/L (ref 135–145)
Total Bilirubin: 0.6 mg/dL (ref 0.3–1.2)
Total Protein: 5.8 g/dL — ABNORMAL LOW (ref 6.5–8.1)

## 2019-12-01 LAB — BASIC METABOLIC PANEL
Anion gap: 7 (ref 5–15)
BUN: 19 mg/dL (ref 6–20)
CO2: 24 mmol/L (ref 22–32)
Calcium: 8.2 mg/dL — ABNORMAL LOW (ref 8.9–10.3)
Chloride: 110 mmol/L (ref 98–111)
Creatinine, Ser: 0.68 mg/dL (ref 0.44–1.00)
GFR calc Af Amer: >60 mL/min (ref >60)
GFR calc non Af Amer: >60 mL/min (ref >60)
Glucose, Bld: 89 mg/dL (ref 70–99)
Potassium: 4.8 mmol/L (ref 3.5–5.1)
Sodium: 141 mmol/L (ref 135–145)

## 2019-12-01 LAB — MAGNESIUM: Magnesium: 1.9 mg/dL (ref 1.7–2.4)

## 2019-12-01 LAB — PROCALCITONIN: Procalcitonin: 1.14 ng/mL

## 2019-12-01 LAB — HIV ANTIBODY (ROUTINE TESTING W REFLEX): HIV Screen 4th Generation wRfx: REACTIVE — AB

## 2019-12-01 LAB — LACTATE DEHYDROGENASE: LDH: 179 U/L (ref 98–192)

## 2019-12-01 LAB — C-REACTIVE PROTEIN: CRP: 9.7 mg/dL — ABNORMAL HIGH (ref <1.0)

## 2019-12-01 LAB — FERRITIN: Ferritin: 135 ng/mL (ref 11–307)

## 2019-12-01 MED ORDER — SODIUM CHLORIDE 0.9% FLUSH: 3.0000 mL | INTRAVENOUS | Status: DC | PRN

## 2019-12-01 MED ORDER — HYDROXYCHLOROQUINE SULFATE 200 MG PO TABS
200.0000 mg | ORAL_TABLET | ORAL | Status: DC
  Administered 2019-12-01: 200 mg via ORAL
  Filled 2019-12-01 (×2): qty 1

## 2019-12-01 MED ORDER — FOLIC ACID 1 MG PO TABS
1.0000 mg | ORAL_TABLET | Freq: Every day | ORAL | Status: DC
  Administered 2019-12-02: 1 mg via ORAL
  Filled 2019-12-01: qty 1

## 2019-12-01 MED ORDER — GABAPENTIN 300 MG PO CAPS
900.0000 mg | ORAL_CAPSULE | Freq: Every day | ORAL | Status: DC
  Administered 2019-12-01: 900 mg via ORAL
  Filled 2019-12-01: qty 3

## 2019-12-01 MED ORDER — ONDANSETRON HCL 4 MG PO TABS: 4.0000 mg | ORAL_TABLET | Freq: Four times a day (QID) | ORAL | Status: DC | PRN

## 2019-12-01 MED ORDER — BACLOFEN 10 MG PO TABS: 30.0000 mg | ORAL_TABLET | Freq: Three times a day (TID) | ORAL | Status: DC

## 2019-12-01 MED ORDER — SODIUM CHLORIDE 0.9 % IV SOLN: 250.0000 mL | INTRAVENOUS | Status: DC | PRN

## 2019-12-01 MED ORDER — ENOXAPARIN SODIUM 40 MG/0.4ML ~~LOC~~ SOLN
40.0000 mg | Freq: Every day | SUBCUTANEOUS | Status: DC
  Administered 2019-12-01: 40 mg via SUBCUTANEOUS
  Filled 2019-12-01 (×2): qty 0.4

## 2019-12-01 MED ORDER — ASPIRIN 81 MG PO CHEW
81.0000 mg | CHEWABLE_TABLET | Freq: Every day | ORAL | Status: DC
  Administered 2019-12-02: 81 mg via ORAL
  Filled 2019-12-01: qty 1

## 2019-12-01 MED ORDER — HYDROXYCHLOROQUINE SULFATE 200 MG PO TABS
400.0000 mg | ORAL_TABLET | ORAL | Status: DC
  Administered 2019-12-02: 400 mg via ORAL
  Filled 2019-12-01: qty 2

## 2019-12-01 MED ORDER — SODIUM CHLORIDE 0.9 % IV SOLN: INTRAVENOUS | Status: DC

## 2019-12-01 MED ORDER — ACETAMINOPHEN 325 MG PO TABS
325.0000 mg | ORAL_TABLET | Freq: Four times a day (QID) | ORAL | Status: DC | PRN
  Administered 2019-12-01: 325 mg via ORAL
  Filled 2019-12-01: qty 1

## 2019-12-01 MED ORDER — TRAMADOL HCL 50 MG PO TABS
50.0000 mg | ORAL_TABLET | Freq: Four times a day (QID) | ORAL | Status: DC | PRN
  Administered 2019-12-01 – 2019-12-02 (×2): 50 mg via ORAL
  Filled 2019-12-01 (×3): qty 1

## 2019-12-01 MED ORDER — METHOTREXATE 2.5 MG PO TABS: 10.0000 mg | ORAL_TABLET | ORAL | Status: DC

## 2019-12-01 MED ORDER — HYDROXYCHLOROQUINE SULFATE 200 MG PO TABS: 200.0000 mg | ORAL_TABLET | Freq: Two times a day (BID) | ORAL | Status: DC

## 2019-12-01 MED ORDER — GABAPENTIN 300 MG PO CAPS
300.0000 mg | ORAL_CAPSULE | Freq: Every day | ORAL | Status: DC
  Administered 2019-12-01 – 2019-12-02 (×2): 300 mg via ORAL
  Filled 2019-12-01 (×2): qty 1

## 2019-12-01 MED ORDER — WHITE PETROLATUM EX OINT
TOPICAL_OINTMENT | CUTANEOUS | Status: DC | PRN
  Administered 2019-12-01: 0.2 via TOPICAL
  Filled 2019-12-01: qty 28.35

## 2019-12-01 MED ORDER — KETOROLAC TROMETHAMINE 15 MG/ML IJ SOLN
15.0000 mg | Freq: Four times a day (QID) | INTRAMUSCULAR | Status: DC | PRN
  Administered 2019-12-01 (×2): 15 mg via INTRAVENOUS
  Filled 2019-12-01 (×2): qty 1

## 2019-12-01 MED ORDER — BACLOFEN 20 MG PO TABS
30.0000 mg | ORAL_TABLET | ORAL | Status: DC
  Administered 2019-12-01: 30 mg via ORAL
  Filled 2019-12-01: qty 1

## 2019-12-01 MED ORDER — BACLOFEN 20 MG PO TABS
40.0000 mg | ORAL_TABLET | Freq: Two times a day (BID) | ORAL | Status: DC
  Administered 2019-12-01 – 2019-12-02 (×3): 40 mg via ORAL
  Filled 2019-12-01 (×3): qty 2

## 2019-12-01 MED ORDER — KETOROLAC TROMETHAMINE 15 MG/ML IJ SOLN: 15.0000 mg | Freq: Four times a day (QID) | INTRAMUSCULAR | Status: DC | PRN

## 2019-12-01 MED ORDER — SODIUM CHLORIDE 0.9% FLUSH
3.0000 mL | Freq: Two times a day (BID) | INTRAVENOUS | Status: DC
  Administered 2019-12-01 (×2): 3 mL via INTRAVENOUS

## 2019-12-01 MED ORDER — FERROUS SULFATE 325 (65 FE) MG PO TABS
325.0000 mg | ORAL_TABLET | Freq: Every day | ORAL | Status: DC
  Filled 2019-12-01: qty 1

## 2019-12-01 MED ORDER — ONDANSETRON HCL 4 MG/2ML IJ SOLN: 4.0000 mg | Freq: Four times a day (QID) | INTRAMUSCULAR | Status: DC | PRN

## 2019-12-01 MED ORDER — GABAPENTIN 300 MG PO CAPS: 900.0000 mg | ORAL_CAPSULE | Freq: Three times a day (TID) | ORAL | Status: DC

## 2019-12-01 NOTE — Progress Notes (Signed)
Patient states that she called to updated 3 of her family members and does not need RN to call anyone for her.
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
## 2019-12-01 NOTE — Progress Notes (Signed)
Patient repeatedly picking at bottom lip, even though staff repeatedly told her not to. Patient made lip bleed twice. Safety mitts applied. Patient has taken them off and put them back on. Vaseline applied to lips. Will continue to monitor.
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
## 2019-12-01 NOTE — Plan of Care (Signed)
Care plan reviewed.

## 2019-12-02 DIAGNOSIS — M069 Rheumatoid arthritis, unspecified: Secondary | ICD-10-CM

## 2019-12-02 DIAGNOSIS — G8929 Other chronic pain: Secondary | ICD-10-CM

## 2019-12-02 DIAGNOSIS — Z21 Asymptomatic human immunodeficiency virus [HIV] infection status: Secondary | ICD-10-CM

## 2019-12-02 DIAGNOSIS — M4800 Spinal stenosis, site unspecified: Secondary | ICD-10-CM

## 2019-12-02 DIAGNOSIS — U071 COVID-19: Secondary | ICD-10-CM

## 2019-12-02 LAB — CBC
HCT: 36.2 % (ref 36.0–46.0)
HCT: 36.3 % (ref 36.0–46.0)
Hemoglobin: 11.9 g/dL — ABNORMAL LOW (ref 12.0–15.0)
Hemoglobin: 12 g/dL (ref 12.0–15.0)
MCH: 32.1 pg (ref 26.0–34.0)
MCH: 32.1 pg (ref 26.0–34.0)
MCHC: 32.9 g/dL (ref 30.0–36.0)
MCHC: 33.1 g/dL (ref 30.0–36.0)
MCV: 97.6 fL (ref 80.0–100.0)
MCV: 97.1 fL (ref 80.0–100.0)
Platelets: 256 10*3/uL (ref 150–400)
Platelets: 256 10*3/uL (ref 150–400)
RBC: 3.71 MIL/uL — ABNORMAL LOW (ref 3.87–5.11)
RBC: 3.74 MIL/uL — ABNORMAL LOW (ref 3.87–5.11)
RDW: 12.8 % (ref 11.5–15.5)
RDW: 12.8 % (ref 11.5–15.5)
WBC: 8.3 10*3/uL (ref 4.0–10.5)
WBC: 8.3 10*3/uL (ref 4.0–10.5)
nRBC: 0 % (ref 0.0–0.2)
nRBC: 0 % (ref 0.0–0.2)

## 2019-12-02 LAB — COMPREHENSIVE METABOLIC PANEL
ALT: 14 U/L (ref 0–44)
AST: 19 U/L (ref 15–41)
Albumin: 3 g/dL — ABNORMAL LOW (ref 3.5–5.0)
Alkaline Phosphatase: 61 U/L (ref 38–126)
Anion gap: 7 (ref 5–15)
BUN: 16 mg/dL (ref 6–20)
CO2: 24 mmol/L (ref 22–32)
Calcium: 8.2 mg/dL — ABNORMAL LOW (ref 8.9–10.3)
Chloride: 110 mmol/L (ref 98–111)
Creatinine, Ser: 0.7 mg/dL (ref 0.44–1.00)
GFR calc Af Amer: >60 mL/min (ref >60)
GFR calc non Af Amer: >60 mL/min (ref >60)
Glucose, Bld: 95 mg/dL (ref 70–99)
Potassium: 3.7 mmol/L (ref 3.5–5.1)
Sodium: 141 mmol/L (ref 135–145)
Total Bilirubin: 0.3 mg/dL (ref 0.3–1.2)
Total Protein: 5.3 g/dL — ABNORMAL LOW (ref 6.5–8.1)

## 2019-12-02 LAB — DIFFERENTIAL
Abs Immature Granulocytes: 0.02 10*3/uL (ref 0.00–0.07)
Basophils Absolute: 0 10*3/uL (ref 0.0–0.1)
Basophils Relative: 0 %
Eosinophils Absolute: 0.1 10*3/uL (ref 0.0–0.5)
Eosinophils Relative: 2 %
Immature Granulocytes: 0 %
Lymphocytes Relative: 18 %
Lymphs Abs: 1.5 10*3/uL (ref 0.7–4.0)
Monocytes Absolute: 0.6 10*3/uL (ref 0.1–1.0)
Monocytes Relative: 8 %
Neutro Abs: 6 10*3/uL (ref 1.7–7.7)
Neutrophils Relative %: 72 %

## 2019-12-02 LAB — KETAMINE, URINE QUAL: Ketamine, Urine: NEGATIVE ng/mL

## 2019-12-02 LAB — C-REACTIVE PROTEIN: CRP: 6 mg/dL — ABNORMAL HIGH (ref <1.0)

## 2019-12-02 LAB — LACTATE DEHYDROGENASE: LDH: 144 U/L (ref 98–192)

## 2019-12-02 LAB — PROCALCITONIN: Procalcitonin: 0.26 ng/mL

## 2019-12-02 LAB — FERRITIN: Ferritin: 103 ng/mL (ref 11–307)
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
## 2019-12-02 NOTE — Progress Notes (Signed)
ID PROGRESS NOTE  Preliminary HIV test is positive however confirmatory testing is still pending. We will call patient to let her know the test results once it comes back which is likely Monday/tuesday. We can see her back in the ID clinic in 10 days as follow up to her hospitalization overall.  Duke Salvia Drue Second MD MPH [facility] [phone]

## 2019-12-02 NOTE — Plan of Care (Signed)
Pt to be discharged

## 2019-12-02 NOTE — Plan of Care (Signed)

## 2019-12-03 LAB — HIV-1 RNA QUANT-NO REFLEX-BLD
HIV 1 RNA Quant: <20 copies/mL
LOG10 HIV-1 RNA: UNDETERMINED log10copy/mL

## 2019-12-05 ENCOUNTER — Telehealth: Payer: Self-pay

## 2019-12-05 ENCOUNTER — Encounter (INDEPENDENT_AMBULATORY_CARE_PROVIDER_SITE_OTHER): Payer: Self-pay

## 2019-12-05 NOTE — Telephone Encounter (Signed)
Can you please arrange a telephone visit with her - any provider is fine.  The only thing that is needed is to follow up on is the HIV-1 antibody. If either Clydie Braun or Katrina can help [place] track that down (would suspect it should have been back by now).

## 2019-12-05 NOTE — Telephone Encounter (Signed)
Patient declined appointment. States she was willing to wait 14 days for results.  States she would prefer a phone call from provider when results are available.  Ashlee [name]

## 2019-12-05 NOTE — Telephone Encounter (Signed)
-----   Message from Judyann Munson, MD sent at 12/02/2019 10:30 AM EST ----- Can we see her back in 14 days as a follow up to covid 19 and hiv testing results. Consider prep if hiv negative

## 2019-12-06 ENCOUNTER — Encounter (INDEPENDENT_AMBULATORY_CARE_PROVIDER_SITE_OTHER): Payer: Self-pay

## 2019-12-06 NOTE — Telephone Encounter (Signed)
Patient scheduled.

## 2019-12-07 ENCOUNTER — Encounter (INDEPENDENT_AMBULATORY_CARE_PROVIDER_SITE_OTHER): Payer: Self-pay

## 2019-12-08 ENCOUNTER — Telehealth: Payer: Self-pay | Admitting: Infectious Diseases

## 2019-12-08 ENCOUNTER — Encounter (INDEPENDENT_AMBULATORY_CARE_PROVIDER_SITE_OTHER): Payer: Self-pay

## 2019-12-08 ENCOUNTER — Telehealth: Payer: Self-pay

## 2019-12-08 DIAGNOSIS — Z114 Encounter for screening for human immunodeficiency virus [HIV]: Secondary | ICD-10-CM

## 2019-12-08 DIAGNOSIS — Z21 Asymptomatic human immunodeficiency virus [HIV] infection status: Secondary | ICD-10-CM

## 2019-12-08 NOTE — Telephone Encounter (Signed)
Per Rexene Alberts, NP requested Clydie Braun, Clinic Phlebotomist to track patient's HIV differentiation Ab results. Requested they update triage team to forward message to Np  Lorenso Courier, CMA

## 2019-12-08 NOTE — Telephone Encounter (Signed)
It looks like it was drawn 1/29 (or reflexed) when the 4th generation came back positive.   If we cannot find it I will call her to have her come by for a HIV antibody to be sure she is not an elite controller (which may explain her negative RNA).
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
## 2019-12-08 NOTE — Telephone Encounter (Signed)
Only results that [facility] has from 1/30 is a HIV PCR which showed that there was no RNA detected. No other tests were ordered.   Kahne Helfand Loyola Mast, RN

## 2019-12-09 ENCOUNTER — Encounter (INDEPENDENT_AMBULATORY_CARE_PROVIDER_SITE_OTHER): Payer: Self-pay

## 2019-12-10 ENCOUNTER — Encounter (INDEPENDENT_AMBULATORY_CARE_PROVIDER_SITE_OTHER): Payer: Self-pay

## 2019-12-11 ENCOUNTER — Other Ambulatory Visit: Payer: Self-pay

## 2019-12-11 ENCOUNTER — Encounter (INDEPENDENT_AMBULATORY_CARE_PROVIDER_SITE_OTHER): Payer: Self-pay

## 2019-12-11 ENCOUNTER — Other Ambulatory Visit: Payer: Medicare Other

## 2019-12-11 ENCOUNTER — Ambulatory Visit: Payer: Medicare Other | Admitting: Infectious Diseases

## 2019-12-11 DIAGNOSIS — Z114 Encounter for screening for human immunodeficiency virus [HIV]: Secondary | ICD-10-CM

## 2019-12-11 DIAGNOSIS — Z21 Asymptomatic human immunodeficiency virus [HIV] infection status: Secondary | ICD-10-CM

## 2019-12-12 ENCOUNTER — Telehealth: Payer: Self-pay | Admitting: Infectious Diseases

## 2019-12-12 LAB — HIV ANTIBODY (ROUTINE TESTING W REFLEX): HIV 1&2 Ab, 4th Generation: NONREACTIVE

## 2019-12-12 NOTE — Telephone Encounter (Signed)
Patient notified of negative RNA and negative Ab result.  She was appreciative of the call and information.   No further follow up with ID.

## 2019-12-15 ENCOUNTER — Encounter (INDEPENDENT_AMBULATORY_CARE_PROVIDER_SITE_OTHER): Payer: Self-pay

## 2019-12-20 ENCOUNTER — Other Ambulatory Visit: Payer: Self-pay

## 2019-12-20 ENCOUNTER — Ambulatory Visit (HOSPITAL_COMMUNITY)
Admission: EM | Admit: 2019-12-20 | Discharge: 2019-12-20 | Disposition: A | Discharge status: Home / Self Care | Payer: Medicare Other

## 2019-12-20 ENCOUNTER — Encounter (HOSPITAL_COMMUNITY): Payer: Self-pay | Admitting: Emergency Medicine

## 2019-12-20 DIAGNOSIS — S61211A Laceration without foreign body of left index finger without damage to nail, initial encounter: Secondary | ICD-10-CM | POA: Diagnosis not present

## 2019-12-20 DIAGNOSIS — M79645 Pain in left finger(s): Secondary | ICD-10-CM
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
## 2019-12-20 NOTE — ED Provider Notes (Signed)
[place]   MRN: [id number] DOB: 11-Mar-1986  Subjective:   Ashlee [name] is a 34 y.o. female presenting for suffering an accidental left index finger laceration while handling a knife today. States that her Tdap is up to date. Has not cleaned her wound. Has not lost ROM, sensation, can still bend finger.  No current facility-administered medications for this encounter.  Current Outpatient Medications:  .  acetaminophen (TYLENOL) 325 MG tablet, Take 325 mg by mouth every 6 (six) hours as needed for pain. , Disp: , Rfl:  .  amphetamine-dextroamphetamine (ADDERALL XR) 25 MG 24 hr capsule, Take 25 mg by mouth every morning., Disp: , Rfl:  .  aspirin 81 MG chewable tablet, Chew 81 mg by mouth daily., Disp: , Rfl:  .  baclofen (LIORESAL) 10 MG tablet, Take 30-40 mg by mouth See admin instructions. 4 tablet in the morning 3 tablets in the afternoon 4 tablets at bedtime, Disp: , Rfl:  .  ferrous sulfate 325 (65 FE) MG tablet, Take 1 tablet by mouth daily., Disp: , Rfl:  .  fexofenadine (ALLEGRA) 60 MG tablet, Take 60 mg by mouth 2 (two) times daily., Disp: , Rfl:  .  folic acid (FOLVITE) 1 MG tablet, Take 1 mg by mouth at bedtime. , Disp: , Rfl:  .  gabapentin (NEURONTIN) 300 MG capsule, Take 300-900 mg by mouth See admin instructions. Take 300mg  in the morning by mouth, and 900mg  at bedtime, Disp: , Rfl:  .  HYDROmorphone (DILAUDID) 2 MG tablet, Take 2 mg by mouth 2 (two) times daily as needed for moderate pain. For pain , Disp: , Rfl:  .  hydroxychloroquine (PLAQUENIL) 200 MG tablet, Take 200 mg by mouth See admin instructions. Alternate every other day with 200mg  by mouth twice daily, then 200mg  by mouth once daily, then repeat., Disp: , Rfl:  .  methotrexate (RHEUMATREX) 2.5 MG tablet, Take 12.5 mg by mouth every Sunday. 5 tablets, Disp: , Rfl:  .  naproxen (NAPROSYN) 500 MG tablet, Take 500 mg by mouth 2 (two) times daily. , Disp: , Rfl:  .  polyethylene glycol powder  (GLYCOLAX/MIRALAX) 17 GM/SCOOP powder, Take 17 g by mouth daily as needed for mild constipation., Disp: , Rfl:  .  spironolactone (ALDACTONE) 50 MG tablet, Take 50 mg by mouth daily., Disp: , Rfl:  .  SUMAtriptan (IMITREX) 25 MG tablet, Take 25 mg by mouth daily as needed for migraine. For migraine, Disp: , Rfl:  .  traMADol (ULTRAM-ER) 100 MG 24 hr tablet, Take 100 mg by mouth 3 (three) times daily., Disp: , Rfl:    Allergies  Allergen Reactions  . Tape Rash, Dermatitis, Itching and Swelling    Use paper tape Other reaction(s): Other (See Comments), Other (See Comments) Blisters and redness  Blisters and redness, Medipore, Durapore and Transpore tapes by 44M   . Juniper Tar     Other reaction(s): Other (See Comments) Contact dermatitis    Past Medical History:  Diagnosis Date  . Allergy    some nuts, juniper bushes  . Anxiety   . Back pain   . GERD (gastroesophageal reflux disease)   . Migraines   . Mitral valve prolapse   . Patent foramen ovale   . Restless leg syndrome   . Rheumatoid arthritis(714.0)   . Sciatica   . Scoliosis   . Spinal cord disease [facility]      Past Surgical History:  Procedure Laterality Date  . BACK SURGERY    .  CARDIAC CATHETERIZATION      History reviewed. No pertinent family history.  Social History   Tobacco Use  . Smoking status: Never Smoker  . Smokeless tobacco: Never Used  Substance Use Topics  . Alcohol use: No  . Drug use: No    ROS   Objective:   Vitals: BP 126/81 (BP Location: Right Arm)   Pulse 92   Temp 98.1 F (36.7 C) (Oral)   Resp 18   SpO2 99%   Physical Exam Constitutional:      General: She is not in acute distress.    Appearance: Normal appearance. She is well-developed. She is not ill-appearing.  HENT:     Head: Normocephalic and atraumatic.     Nose: Nose normal.     Mouth/Throat:     Mouth: Mucous membranes are moist.     Pharynx: Oropharynx is clear.  Eyes:     General: No scleral icterus.     Extraocular Movements: Extraocular movements intact.     Pupils: Pupils are equal, round, and reactive to light.  Cardiovascular:     Rate and Rhythm: Normal rate.  Pulmonary:     Effort: Pulmonary effort is normal.  Musculoskeletal:     Left hand: Laceration (very superficial, bleeding controlled, extends across ~1cm on left index fingerpad) and tenderness (of laceration) present.  Skin:    General: Skin is warm and dry.  Neurological:     General: No focal deficit present.     Mental Status: She is alert and oriented to person, place, and time.  Psychiatric:        Mood and Affect: Mood normal.        Behavior: Behavior normal.    Dermabond applied to 1cm superficial laceration of left index fingerpad.   Assessment and Plan :   1. Finger pain, left   2. Laceration of left index finger without foreign body without damage to nail, initial encounter    Laceration repair using Dermabond. Wound care instructions reviewed. Counseled patient on potential for adverse effects with medications prescribed/recommended today, ER and return-to-clinic precautions discussed, patient verbalized understanding.     Wallis Bamberg, PA-C 12/20/19 1949

## 2019-12-20 NOTE — Discharge Instructions (Addendum)
You may take 500mg  Tylenol with ibuprofen 400-600mg  every 6 hours for pain and inflammation from your finger laceration. You can take off the dressing in 24 hours. Keep the wound dry and clean thereafter. Limit your hand use to avoid reopening your wound.

## 2020-05-01 ENCOUNTER — Ambulatory Visit (HOSPITAL_COMMUNITY)
Admission: EM | Admit: 2020-05-01 | Discharge: 2020-05-01 | Disposition: A | Discharge status: Home / Self Care | Payer: Medicare Other | Attending: Family Medicine | Admitting: Family Medicine

## 2020-05-01 ENCOUNTER — Encounter (HOSPITAL_COMMUNITY): Payer: Self-pay

## 2020-05-01 ENCOUNTER — Other Ambulatory Visit: Payer: Self-pay

## 2020-05-01 DIAGNOSIS — L03818 Cellulitis of other sites: Secondary | ICD-10-CM | POA: Diagnosis not present

## 2020-05-01 MED ORDER — CEPHALEXIN 500 MG PO CAPS: 500.0000 mg | ORAL_CAPSULE | Freq: Four times a day (QID) | ORAL | 0 refills | Status: AC

## 2020-05-01 NOTE — Discharge Instructions (Signed)
Take antibiotics as prescribed.  Return or go to ER for any fever, chills or increase in redness

## 2020-05-01 NOTE — ED Provider Notes (Signed)
[facility]    CSN: [id number] Arrival date & time: 05/01/20  1829      History   Chief Complaint Chief Complaint  Patient presents with  . Knee Injury    HPI Ashlee [name] is a 34 y.o. female with past medical history of spinal cord injury, PFO, RA presents with 1 week history of swelling and redness to left knee.  Patient states she has been working hard to clean out her house and on hard for surface on knees going through boxes for extended period of time.  Shortly after doing all this work she began experiencing pain in her left knee with subsequent swelling and redness.  Patient denies any specific injury or trauma.  She denies any recent fever or chills    Past Medical History:  Diagnosis Date  . Allergy    some nuts, juniper bushes  . Anxiety   . Back pain   . GERD (gastroesophageal reflux disease)   . Migraines   . Mitral valve prolapse   . Patent foramen ovale   . Restless leg syndrome   . Rheumatoid arthritis(714.0)   . Sciatica   . Scoliosis   . Spinal cord disease [facility])     Patient Active Problem List   Diagnosis Date Noted  . Rheumatoid arthritis (HCC) 12/01/2019  . Spinal stenosis 12/01/2019  . Chronic pain 12/01/2019  . Acute encephalopathy 12/01/2019  . Hyperkalemia 12/01/2019  . Seizures (HCC) 12/01/2019  . COVID-19 virus infection 12/01/2019    Past Surgical History:  Procedure Laterality Date  . BACK SURGERY    . CARDIAC CATHETERIZATION      OB History   No obstetric history on file.      Home Medications    Prior to Admission medications   Medication Sig Start Date End Date Taking? Authorizing Provider  tiZANidine (ZANAFLEX) 4 MG capsule Take 4 mg by mouth 3 (three) times daily.   Yes [provider]  acetaminophen (TYLENOL) 325 MG tablet Take 325 mg by mouth every 6 (six) hours as needed for pain.     [provider]  amphetamine-dextroamphetamine (ADDERALL XR) 25 MG 24 hr capsule Take 25 mg by mouth  every morning.    [provider]  aspirin 81 MG chewable tablet Chew 81 mg by mouth daily.    [provider]  baclofen (LIORESAL) 10 MG tablet Take 30-40 mg by mouth See admin instructions. 4 tablet in the morning 3 tablets in the afternoon 4 tablets at bedtime    [provider]  cephALEXin (KEFLEX) 500 MG capsule Take 1 capsule (500 mg total) by mouth 4 (four) times daily for 7 days. 05/01/20 05/08/20  Rolla Etienne, NP  ferrous sulfate 325 (65 FE) MG tablet Take 1 tablet by mouth daily.    [provider]  fexofenadine (ALLEGRA) 60 MG tablet Take 60 mg by mouth 2 (two) times daily.    [provider]  folic acid (FOLVITE) 1 MG tablet Take 1 mg by mouth at bedtime.  12/11/14   [provider]  gabapentin (NEURONTIN) 300 MG capsule Take 300-900 mg by mouth See admin instructions. Take 300mg  in the morning by mouth, and 900mg  at bedtime    [provider]  HYDROmorphone (DILAUDID) 2 MG tablet Take 2 mg by mouth 2 (two) times daily as needed for moderate pain. For pain     [provider]  hydroxychloroquine (PLAQUENIL) 200 MG tablet Take 200 mg by mouth  See admin instructions. Alternate every other day with 200mg  by mouth twice daily, then 200mg  by mouth once daily, then repeat.    [provider]  methotrexate (RHEUMATREX) 2.5 MG tablet Take 12.5 mg by mouth every Sunday. 5 tablets    [provider]  naproxen (NAPROSYN) 500 MG tablet Take 500 mg by mouth 2 (two) times daily.     [provider]  polyethylene glycol powder (GLYCOLAX/MIRALAX) 17 GM/SCOOP powder Take 17 g by mouth daily as needed for mild constipation. 11/08/17   [provider]  spironolactone (ALDACTONE) 50 MG tablet Take 50 mg by mouth daily. 02/15/19   [provider]  SUMAtriptan (IMITREX) 25 MG tablet Take 25 mg by mouth daily as needed for migraine. For migraine    [provider]  traMADol (ULTRAM-ER) 100  MG 24 hr tablet Take 100 mg by mouth 3 (three) times daily.    [provider]    Family History No family history on file.  Social History Social History   Tobacco Use  . Smoking status: Never Smoker  . Smokeless tobacco: Never Used  Substance Use Topics  . Alcohol use: No  . Drug use: No     Allergies   Tape and Juniper tar   Review of Systems As stated in HPI otherwise negative  Physical Exam Triage Vital Signs ED Triage Vitals  Enc Vitals Group     BP 05/01/20 1915 125/64     Pulse Rate 05/01/20 1915 100     Resp 05/01/20 1915 18     Temp 05/01/20 1915 100.3 F (37.9 C)     Temp Source 05/01/20 1915 Oral     SpO2 05/01/20 1915 98 %     Weight 05/01/20 1919 150 lb (68 kg)     Height 05/01/20 1919 5\' 2"  (1.575 m)     Head Circumference --      Peak Flow --      Pain Score 05/01/20 1919 7     Pain Loc --      Pain Edu? --      Excl. in GC? --    No data found.  Updated Vital Signs BP 125/64   Pulse 100   Temp 100.3 F (37.9 C) (Oral)   Resp 18   Ht 5\' 2"  (1.575 m)   Wt 68 kg   SpO2 98%   BMI 27.44 kg/m      Physical Exam Constitutional:      General: She is not in acute distress.    Appearance: Normal appearance. She is not ill-appearing or toxic-appearing.  Musculoskeletal:        General: Swelling and tenderness present. No deformity.  Skin:    General: Skin is warm and dry.     Findings: Erythema present.  Neurological:     Mental Status: She is alert.        UC Treatments / Results  Labs (all labs ordered are listed, but only abnormal results are displayed) Labs Reviewed - No data to display  EKG   Radiology No results found.  Procedures Procedures (including critical care time)  Medications Ordered in UC Medications - No data to display  Initial Impression / Assessment and Plan / UC Course  I have reviewed the triage vital signs and the nursing notes.  Pertinent labs & imaging results that were available  during my care of the patient were reviewed by me and considered in my medical decision making (see chart  for details).  Cellulitis, left knee -No evidence of systemic infection -Keflex x 7 days -Follow-up for any increased redness or fever  Final Clinical Impressions(s) / UC Diagnoses   Final diagnoses:  Cellulitis of other specified site     Discharge Instructions     Take antibiotics as prescribed.  Return or go to ER for any fever, chills or increase in redness    ED Prescriptions    Medication Sig Dispense Auth. Provider   cephALEXin (KEFLEX) 500 MG capsule Take 1 capsule (500 mg total) by mouth 4 (four) times daily for 7 days. 28 capsule Rolla Etienne, NP     PDMP not reviewed this encounter.   Rolla Etienne, NP 05/02/20 1836

## 2020-05-01 NOTE — ED Triage Notes (Signed)
Pt states she was going through boxes on knees on a hard surface for long periods of time and left knee became swollen, erythematous and painful. Pt states this started last week. Pt has 3 + swelling of left knee, left knee is erythematous. Pt used cane to walk to triage room and was still limping very hard.

## 2020-08-30 ENCOUNTER — Encounter (HOSPITAL_COMMUNITY): Payer: Self-pay

## 2020-08-30 ENCOUNTER — Other Ambulatory Visit: Payer: Self-pay

## 2020-08-30 ENCOUNTER — Ambulatory Visit (HOSPITAL_COMMUNITY)
Admission: EM | Admit: 2020-08-30 | Discharge: 2020-08-30 | Disposition: A | Discharge status: Home / Self Care | Payer: Medicare Other | Attending: Family Medicine | Admitting: Family Medicine

## 2020-08-30 DIAGNOSIS — R21 Rash and other nonspecific skin eruption: Secondary | ICD-10-CM

## 2020-08-30 DIAGNOSIS — T7840XA Allergy, unspecified, initial encounter: Secondary | ICD-10-CM

## 2020-08-30 MED ORDER — CETIRIZINE HCL 10 MG PO CAPS: 10.0000 mg | ORAL_CAPSULE | Freq: Every day | ORAL | 0 refills | Status: DC

## 2020-08-30 MED ORDER — METHYLPREDNISOLONE SODIUM SUCC 125 MG IJ SOLR
125.0000 mg | Freq: Once | INTRAMUSCULAR | Status: AC
  Administered 2020-08-30: 125 mg via INTRAMUSCULAR

## 2020-08-30 MED ORDER — METHYLPREDNISOLONE SODIUM SUCC 125 MG IJ SOLR
INTRAMUSCULAR | Status: AC
  Filled 2020-08-30: qty 2

## 2020-08-30 MED ORDER — PREDNISONE 10 MG PO TABS: 30.0000 mg | ORAL_TABLET | Freq: Every day | ORAL | 0 refills | Status: AC

## 2020-08-30 NOTE — ED Triage Notes (Signed)
Pt presents with complaints of sudden onset of red hands and itching to her hands that started today. Reports the redness is starting to go up her arms a little bit. Unsure what she could be having a reaction too. Reports taking 75 mg of benadryl pta and had improvement.

## 2020-08-30 NOTE — Discharge Instructions (Signed)
We gave you a shot of Solu-Medrol today Begin in the morning with prednisone 30 mg daily for the next 5 days, symptoms completely resolve over the next 3-4 May stop prednisone early-I recommend taking with food and earlier in the day if possible Continue antihistamines-daily cetirizine/Zyrtec or loratadine/Claritin in the morning, supplement with Benadryl at nighttime Follow-up if not improving or worsening, developing any difficulty breathing or shortness of breath

## 2020-08-30 NOTE — ED Provider Notes (Signed)
[facility]    CSN: [id number] Arrival date & time: 08/30/20  1909      History   Chief Complaint Chief Complaint  Patient presents with  . Rash    HPI Ashlee [name] is a 34 y.o. female history of scoliosis presenting today for evaluation of rash and possible allergic reaction. Patient reports that approximately 1 hour ago she was sitting on the side of her bed and developed redness and itching to her hands which traveled of bilateral lower extremities, denies symptoms to lower extremities or trunk. Feels slight itching to left side of upper lip. Denies any difficulty breathing or oral swelling. She denies any new foods, medicines, denies new hygiene products, soaps lotions detergents. Does report she believes she has allergy to dust, but denies recent exposure. Took Benadryl and symptoms have been improving.  HPI  Past Medical History:  Diagnosis Date  . Allergy    some nuts, juniper bushes  . Anxiety   . Back pain   . GERD (gastroesophageal reflux disease)   . Migraines   . Mitral valve prolapse   . Patent foramen ovale   . Restless leg syndrome   . Rheumatoid arthritis(714.0)   . Sciatica   . Scoliosis   . Spinal cord disease [facility])     Patient Active Problem List   Diagnosis Date Noted  . Rheumatoid arthritis (HCC) 12/01/2019  . Spinal stenosis 12/01/2019  . Chronic pain 12/01/2019  . Acute encephalopathy 12/01/2019  . Hyperkalemia 12/01/2019  . Seizures (HCC) 12/01/2019  . COVID-19 virus infection 12/01/2019    Past Surgical History:  Procedure Laterality Date  . BACK SURGERY    . CARDIAC CATHETERIZATION      OB History   No obstetric history on file.      Home Medications    Prior to Admission medications   Medication Sig Start Date End Date Taking? Authorizing Provider  acetaminophen (TYLENOL) 325 MG tablet Take 325 mg by mouth every 6 (six) hours as needed for pain.     [provider]  amphetamine-dextroamphetamine  (ADDERALL XR) 25 MG 24 hr capsule Take 25 mg by mouth every morning.    [provider]  aspirin 81 MG chewable tablet Chew 81 mg by mouth daily.    [provider]  baclofen (LIORESAL) 10 MG tablet Take 30-40 mg by mouth See admin instructions. 4 tablet in the morning 3 tablets in the afternoon 4 tablets at bedtime    [provider]  Cetirizine HCl 10 MG CAPS Take 1 capsule (10 mg total) by mouth daily for 10 days. 08/30/20 09/09/20  Luay Balding C, PA-C  ferrous sulfate 325 (65 FE) MG tablet Take 1 tablet by mouth daily.    [provider]  fexofenadine (ALLEGRA) 60 MG tablet Take 60 mg by mouth 2 (two) times daily.    [provider]  folic acid (FOLVITE) 1 MG tablet Take 1 mg by mouth at bedtime.  12/11/14   [provider]  gabapentin (NEURONTIN) 300 MG capsule Take 300-900 mg by mouth See admin instructions. Take 300mg  in the morning by mouth, and 900mg  at bedtime    [provider]  HYDROmorphone (DILAUDID) 2 MG tablet Take 2 mg by mouth 2 (two) times daily as needed for moderate pain. For pain     [provider]  hydroxychloroquine (PLAQUENIL) 200 MG tablet Take 200 mg by mouth See admin instructions. Alternate every other day with 200mg  by mouth twice  daily, then 200mg  by mouth once daily, then repeat.    [provider]  methotrexate (RHEUMATREX) 2.5 MG tablet Take 12.5 mg by mouth every Sunday. 5 tablets    [provider]  naproxen (NAPROSYN) 500 MG tablet Take 500 mg by mouth 2 (two) times daily.     [provider]  polyethylene glycol powder (GLYCOLAX/MIRALAX) 17 GM/SCOOP powder Take 17 g by mouth daily as needed for mild constipation. 11/08/17   [provider]  predniSONE (DELTASONE) 10 MG tablet Take 3 tablets (30 mg total) by mouth daily with breakfast for 5 days. 08/30/20 09/04/20  Rosanne Wohlfarth C, PA-C  spironolactone (ALDACTONE) 50 MG tablet Take 50 mg by mouth daily.  02/15/19   [provider]  SUMAtriptan (IMITREX) 25 MG tablet Take 25 mg by mouth daily as needed for migraine. For migraine    [provider]  tiZANidine (ZANAFLEX) 4 MG capsule Take 4 mg by mouth 3 (three) times daily.    [provider]  traMADol (ULTRAM-ER) 100 MG 24 hr tablet Take 100 mg by mouth 3 (three) times daily.    [provider]    Family History Family History  Problem Relation Age of Onset  . Healthy Mother   . Heart attack Father     Social History Social History   Tobacco Use  . Smoking status: Never Smoker  . Smokeless tobacco: Never Used  Substance Use Topics  . Alcohol use: No  . Drug use: No     Allergies   Tape and Juniper tar   Review of Systems Review of Systems  Constitutional: Negative for fatigue and fever.  HENT: Negative for mouth sores.   Eyes: Negative for visual disturbance.  Respiratory: Negative for shortness of breath.   Cardiovascular: Negative for chest pain.  Gastrointestinal: Negative for abdominal pain, nausea and vomiting.  Genitourinary: Negative for genital sores.  Musculoskeletal: Negative for arthralgias and joint swelling.  Skin: Negative for color change, rash and wound.  Neurological: Negative for dizziness, weakness, light-headedness and headaches.     Physical Exam Triage Vital Signs ED Triage Vitals  Enc Vitals Group     BP      Pulse      Resp      Temp      Temp src      SpO2      Weight      Height      Head Circumference      Peak Flow      Pain Score      Pain Loc      Pain Edu?      Excl. in GC?    No data found.  Updated Vital Signs BP 117/75   Pulse 94   Temp 98.4 F (36.9 C)   Resp 19   LMP 08/16/2020   SpO2 95%   Visual Acuity Right Eye Distance:   Left Eye Distance:   Bilateral Distance:    Right Eye Near:   Left Eye Near:    Bilateral Near:     Physical Exam Vitals and nursing note reviewed.  Constitutional:      Appearance: She is  well-developed.     Comments: No acute distress  HENT:     Head: Normocephalic and atraumatic.     Nose: Nose normal.     Mouth/Throat:     Comments: Oral mucosa pink and moist, no tonsillar enlargement or exudate. Posterior pharynx patent and nonerythematous, no  uvula deviation or swelling. Normal phonation. Eyes:     Conjunctiva/sclera: Conjunctivae normal.  Cardiovascular:     Rate and Rhythm: Normal rate.  Pulmonary:     Effort: Pulmonary effort is normal. No respiratory distress.     Comments: Breathing comfortably at rest, CTABL, no wheezing, rales or other adventitious sounds auscultated Abdominal:     General: There is no distension.  Musculoskeletal:        General: Normal range of motion.     Cervical back: Neck supple.  Skin:    General: Skin is warm and dry.     Comments: Bilateral palmar aspects of hands appear erythematous, bilateral upper extremities with erythematous hive-like areas bilaterally  Neurological:     Mental Status: She is alert and oriented to person, place, and time.      UC Treatments / Results  Labs (all labs ordered are listed, but only abnormal results are displayed) Labs Reviewed - No data to display  EKG   Radiology No results found.  Procedures Procedures (including critical care time)  Medications Ordered in UC Medications  methylPREDNISolone sodium succinate (SOLU-MEDROL) 125 mg/2 mL injection 125 mg (125 mg Intramuscular Given 08/30/20 1954)    Initial Impression / Assessment and Plan / UC Course  I have reviewed the triage vital signs and the nursing notes.  Pertinent labs & imaging results that were available during my care of the patient were reviewed by me and considered in my medical decision making (see chart for details).     Treating for allergic reaction, given itching noted to lip, providing Solu-Medrol and will continue on prednisone daily x5 days. Continue antihistamines with close monitoring. No airway  compromise at this time.  Discussed strict return precautions. Patient verbalized understanding and is agreeable with plan.  Final Clinical Impressions(s) / UC Diagnoses   Final diagnoses:  Allergic reaction, initial encounter  Rash and nonspecific skin eruption     Discharge Instructions     We gave you a shot of Solu-Medrol today Begin in the morning with prednisone 30 mg daily for the next 5 days, symptoms completely resolve over the next 3-4 May stop prednisone early-I recommend taking with food and earlier in the day if possible Continue antihistamines-daily cetirizine/Zyrtec or loratadine/Claritin in the morning, supplement with Benadryl at nighttime Follow-up if not improving or worsening, developing any difficulty breathing or shortness of breath    ED Prescriptions    Medication Sig Dispense Auth. Provider   predniSONE (DELTASONE) 10 MG tablet Take 3 tablets (30 mg total) by mouth daily with breakfast for 5 days. 15 tablet Shammara Jarrett C, PA-C   Cetirizine HCl 10 MG CAPS Take 1 capsule (10 mg total) by mouth daily for 10 days. 10 capsule Tasman Zapata, [place] C, PA-C     PDMP not reviewed this encounter.   Lew Dawes, [place] 08/30/20 1959

## 2020-11-02 ENCOUNTER — Ambulatory Visit (HOSPITAL_COMMUNITY)
Admission: EM | Admit: 2020-11-02 | Discharge: 2020-11-02 | Disposition: A | Discharge status: Home / Self Care | Payer: Medicare Other | Attending: Family Medicine | Admitting: Family Medicine

## 2020-11-02 ENCOUNTER — Other Ambulatory Visit: Payer: Self-pay

## 2020-11-02 DIAGNOSIS — Z20822 Contact with and (suspected) exposure to covid-19: Secondary | ICD-10-CM | POA: Diagnosis not present

## 2020-11-02 DIAGNOSIS — U071 COVID-19: Secondary | ICD-10-CM | POA: Diagnosis present

## 2020-11-02 NOTE — ED Triage Notes (Signed)
Here for a Covid test for preop lumbar spinal tap scheduled for 11/06/2019  Pt is asymptomatic.

## 2020-11-03 LAB — SARS CORONAVIRUS 2 (TAT 6-24 HRS): SARS Coronavirus 2: NEGATIVE

## 2021-01-16 ENCOUNTER — Inpatient Hospital Stay (HOSPITAL_COMMUNITY)
Admission: EM | Admit: 2021-01-16 | Discharge: 2021-01-19 | DRG: 603 | Disposition: A | Discharge status: Home / Self Care | Payer: Medicare Other | Attending: Internal Medicine | Admitting: Internal Medicine

## 2021-01-16 ENCOUNTER — Encounter (HOSPITAL_COMMUNITY): Payer: Self-pay

## 2021-01-16 ENCOUNTER — Ambulatory Visit (HOSPITAL_COMMUNITY)
Admission: EM | Admit: 2021-01-16 | Discharge: 2021-01-16 | Disposition: A | Discharge status: Home / Self Care | Payer: Medicare Other

## 2021-01-16 ENCOUNTER — Other Ambulatory Visit: Payer: Self-pay

## 2021-01-16 DIAGNOSIS — M7989 Other specified soft tissue disorders: Secondary | ICD-10-CM

## 2021-01-16 DIAGNOSIS — M069 Rheumatoid arthritis, unspecified: Secondary | ICD-10-CM | POA: Diagnosis present

## 2021-01-16 DIAGNOSIS — G8929 Other chronic pain: Secondary | ICD-10-CM | POA: Diagnosis present

## 2021-01-16 DIAGNOSIS — Z7982 Long term (current) use of aspirin: Secondary | ICD-10-CM

## 2021-01-16 DIAGNOSIS — Z8261 Family history of arthritis: Secondary | ICD-10-CM

## 2021-01-16 DIAGNOSIS — M5430 Sciatica, unspecified side: Secondary | ICD-10-CM | POA: Diagnosis present

## 2021-01-16 DIAGNOSIS — M79661 Pain in right lower leg: Secondary | ICD-10-CM | POA: Diagnosis not present

## 2021-01-16 DIAGNOSIS — L0390 Cellulitis, unspecified: Secondary | ICD-10-CM | POA: Diagnosis present

## 2021-01-16 DIAGNOSIS — Z20822 Contact with and (suspected) exposure to covid-19: Secondary | ICD-10-CM | POA: Diagnosis present

## 2021-01-16 DIAGNOSIS — Z79899 Other long term (current) drug therapy: Secondary | ICD-10-CM

## 2021-01-16 DIAGNOSIS — F909 Attention-deficit hyperactivity disorder, unspecified type: Secondary | ICD-10-CM | POA: Diagnosis present

## 2021-01-16 DIAGNOSIS — F419 Anxiety disorder, unspecified: Secondary | ICD-10-CM | POA: Diagnosis present

## 2021-01-16 DIAGNOSIS — Z9989 Dependence on other enabling machines and devices: Secondary | ICD-10-CM

## 2021-01-16 DIAGNOSIS — G839 Paralytic syndrome, unspecified: Secondary | ICD-10-CM | POA: Diagnosis present

## 2021-01-16 DIAGNOSIS — Z79890 Hormone replacement therapy (postmenopausal): Secondary | ICD-10-CM

## 2021-01-16 DIAGNOSIS — Q211 Atrial septal defect: Secondary | ICD-10-CM

## 2021-01-16 DIAGNOSIS — G2581 Restless legs syndrome: Secondary | ICD-10-CM | POA: Diagnosis present

## 2021-01-16 DIAGNOSIS — L03115 Cellulitis of right lower limb: Principal | ICD-10-CM | POA: Diagnosis present

## 2021-01-16 DIAGNOSIS — Z87828 Personal history of other (healed) physical injury and trauma: Secondary | ICD-10-CM

## 2021-01-16 DIAGNOSIS — Z91048 Other nonmedicinal substance allergy status: Secondary | ICD-10-CM

## 2021-01-16 DIAGNOSIS — I959 Hypotension, unspecified: Secondary | ICD-10-CM | POA: Diagnosis present

## 2021-01-16 DIAGNOSIS — M4800 Spinal stenosis, site unspecified: Secondary | ICD-10-CM | POA: Diagnosis present

## 2021-01-16 NOTE — Discharge Instructions (Addendum)
-  Please head straight to [facility], ER for further evaluation of leg swelling and clot rule out. -Stop and call 911 if you experience shortness of breath, dizziness, chest pain, loss of consciousness.

## 2021-01-16 NOTE — ED Triage Notes (Signed)
Pt presents swelling and pain in the right knee and  right leg; tingling bottom right foot  x 5 days. Pain worsens with weightbearing, standing up.

## 2021-01-16 NOTE — ED Triage Notes (Signed)
Right knee and leg swelling, redness and warm to touch with numbness especially when standing.

## 2021-01-16 NOTE — ED Provider Notes (Signed)
[facility]    CSN: [id number] Arrival date & time: 01/16/21  1919      History   Chief Complaint Chief Complaint  Patient presents with  . Leg Pain    HPI Ashlee [name] is a 35 y.o. female presenting with unilateral R leg pain and swelling x5 days. History spinal cord injury, rheumatoid arthritis. Pt presents swelling and pain in the right knee and thigh; tingling bottom right foot x5 days. Pain worsens with weightbearing, standing up.. States she is concerned about a blood clot.  She has decreased mobility at baseline due to spinal cord injury. No recent trauma or surgery, denies falls/trauma. No malignancy. Doesn't take OCP. No history of clot in the past.  States she has her "normal" amount of shortness of breath, denies chest pain, palpitations.  HPI  Past Medical History:  Diagnosis Date  . Allergy    some nuts, juniper bushes  . Anxiety   . Back pain   . GERD (gastroesophageal reflux disease)   . Migraines   . Mitral valve prolapse   . Patent foramen ovale   . Restless leg syndrome   . Rheumatoid arthritis(714.0)   . Sciatica   . Scoliosis   . Spinal cord disease [facility])     Patient Active Problem List   Diagnosis Date Noted  . Rheumatoid arthritis (HCC) 12/01/2019  . Spinal stenosis 12/01/2019  . Chronic pain 12/01/2019  . Acute encephalopathy 12/01/2019  . Hyperkalemia 12/01/2019  . Seizures (HCC) 12/01/2019  . COVID-19 virus infection 12/01/2019    Past Surgical History:  Procedure Laterality Date  . BACK SURGERY    . CARDIAC CATHETERIZATION      OB History   No obstetric history on file.      Home Medications    Prior to Admission medications   Medication Sig Start Date End Date Taking? Authorizing Provider  acetaminophen (TYLENOL) 325 MG tablet Take 325 mg by mouth every 6 (six) hours as needed for pain.     [provider]  amphetamine-dextroamphetamine (ADDERALL XR) 30 MG 24 hr capsule Take 30 mg by mouth every  morning.    [provider]  aspirin 81 MG chewable tablet Chew 81 mg by mouth daily.    [provider]  b complex vitamins capsule Take 1 capsule by mouth daily.    [provider]  baclofen (LIORESAL) 10 MG tablet Take 30-40 mg by mouth See admin instructions. 4 tablet in the morning 3 tablets in the afternoon 4 tablets at bedtime    [provider]  cetirizine (ZYRTEC) 10 MG tablet Take 10 mg by mouth 2 (two) times daily.    [provider]  diphenhydrAMINE (BENADRYL) 25 mg capsule Take 25 mg by mouth daily as needed for allergies.    [provider]  ferrous sulfate 325 (65 FE) MG tablet Take 1 tablet by mouth daily.    [provider]  fexofenadine (ALLEGRA) 60 MG tablet Take 60 mg by mouth daily as needed for allergies.    [provider]  folic acid (FOLVITE) 1 MG tablet Take 1 mg by mouth at bedtime.  12/11/14   [provider]  gabapentin (NEURONTIN) 300 MG capsule Take 900 mg by mouth at bedtime.    [provider]  HYDROmorphone (DILAUDID) 2 MG tablet Take 2 mg by mouth 2 (two) times daily as needed for moderate pain. For pain    [provider]  hydroxychloroquine (PLAQUENIL) 200 MG  tablet Take 200 mg by mouth See admin instructions. Alternate every other day with  by mouth twice daily, then  by mouth once daily, then repeat.    [provider]  Magnesium 250 MG TABS Take 250 mg by mouth daily.    [provider]  melatonin 3 MG TABS tablet Take 3 mg by mouth at bedtime.    [provider]  Menaquinone-7 (VITAMIN K2 PO) Take 1 tablet by mouth daily.    [provider]  Multiple Vitamins-Minerals (ALIVE WOMENS ENERGY PO) Take 1 tablet by mouth daily.    [provider]  naloxone [facility]) nasal spray 4 mg/0.1 mL Place 4 mg into the nose as needed. 01/01/20   [provider]  naproxen (NAPROSYN) 500 MG tablet Take 500 mg by mouth 2  (two) times daily.    [provider]  NIKKI 3-0.02 MG tablet Take 1 tablet by mouth daily. 01/08/21   [provider]  polyethylene glycol powder (GLYCOLAX/MIRALAX) 17 GM/SCOOP powder Take 17 g by mouth daily as needed for mild constipation. 11/08/17   [provider]  sertraline (ZOLOFT) 50 MG tablet Take 75 mg by mouth daily. 11/15/20   [provider]  spironolactone (ALDACTONE) 50 MG tablet Take 50 mg by mouth daily. 02/15/19   [provider]  SUMAtriptan (IMITREX) 25 MG tablet Take 25 mg by mouth daily as needed for migraine. For migraine    [provider]  tiZANidine (ZANAFLEX) 4 MG capsule Take 4 mg by mouth 3 (three) times daily.    [provider]  traMADol (ULTRAM-ER) 100 MG 24 hr tablet Take 100 mg by mouth 3 (three) times daily.    [provider]  traZODone (DESYREL) 50 MG tablet Take 50 mg by mouth at bedtime. 01/13/21   [provider]  Vitamin D, Cholecalciferol, 25 MCG (1000 UT) TABS Take 1,000 Units by mouth daily.    [provider]    Family History Family History  Problem Relation Age of Onset  . Healthy Mother   . Heart attack Father     Social History Social History   Tobacco Use  . Smoking status: Never Smoker  . Smokeless tobacco: Never Used  Substance Use Topics  . Alcohol use: No  . Drug use: No     Allergies   Other, Tape, and Juniper tar   Review of Systems Review of Systems  Musculoskeletal:       R leg swelling and pain  All other systems reviewed and are negative.    Physical Exam Triage Vital Signs ED Triage Vitals  Enc Vitals Group     BP 01/16/21 1935 126/74     Pulse Rate 01/16/21 1935 (!) 108     Resp 01/16/21 1935 18     Temp 01/16/21 1935 97.8 F (36.6 C)     Temp Source 01/16/21 1935 Oral     SpO2 01/16/21 1935 98 %     Weight --      Height --      Head Circumference --      Peak Flow --      Pain Score 01/16/21 1930 5     Pain Loc  --      Pain Edu? --      Excl. in GC? --    No data found.  Updated Vital Signs BP 126/74 (BP Location: Right Arm)   Pulse (!) 108   Temp 97.8 F (36.6 C) (Oral)  Resp 18   LMP  (Within Weeks) Comment: 3 weeks  SpO2 98%   Visual Acuity Right Eye Distance:   Left Eye Distance:   Bilateral Distance:    Right Eye Near:   Left Eye Near:    Bilateral Near:     Physical Exam Vitals reviewed.  Constitutional:      Appearance: Normal appearance.  HENT:     Head: Normocephalic and atraumatic.  Eyes:     Extraocular Movements: Extraocular movements intact.     Pupils: Pupils are equal, round, and reactive to light.  Cardiovascular:     Rate and Rhythm: Normal rate and regular rhythm.     Heart sounds: Normal heart sounds.     Comments: Positive R homan sign  No venous distension  Pulmonary:     Effort: Pulmonary effort is normal.     Breath sounds: Normal breath sounds.  Musculoskeletal:     Right knee: Swelling, effusion and erythema present. No deformity, ecchymosis, lacerations, bony tenderness or crepitus. Normal range of motion. Tenderness present over the patellar tendon. Normal alignment, normal meniscus and normal patellar mobility. Normal pulse.     Left knee: Normal.     Right lower leg: 2+ Edema present.     Left lower leg: No edema.     Comments: Decreased mobility, ambulates with cane  R knee and thigh with effusion, erythema, warmth. Tenderness over patellar tendon insertion. Positive homan sign but no calf swelling or erythema. Calves are equal and symmetric. DP 2+, cap refill <2 seconds. Thigh with 1+ edema. Small abrasion R anterior shin, healing well, no surrounding erythema warmth or discharge.  No R knee jointline tenderness, laxity, crepitus.   Skin:    General: Skin is warm.     Capillary Refill: Capillary refill takes less than 2 seconds.  Neurological:     General: No focal deficit present.     Mental Status: She is alert and oriented to person,  place, and time.  Psychiatric:        Mood and Affect: Mood normal.        Behavior: Behavior normal.        Thought Content: Thought content normal.        Judgment: Judgment normal.      UC Treatments / Results  Labs (all labs ordered are listed, but only abnormal results are displayed) Labs Reviewed - No data to display  EKG   Radiology CT Angio Chest PE W and/or Wo Contrast  Result Date: 01/17/2021 CLINICAL DATA:  Right knee and leg swelling EXAM: CT ANGIOGRAPHY CHEST WITH CONTRAST TECHNIQUE: Multidetector CT imaging of the chest was performed using the standard protocol during bolus administration of intravenous contrast. Multiplanar CT image reconstructions and MIPs were obtained to evaluate the vascular anatomy. CONTRAST:  74mL OMNIPAQUE IOHEXOL 350 MG/ML SOLN COMPARISON:  None. FINDINGS: Cardiovascular: Poor bolus. There is filling defect in the main or right and left pulmonary arteries. Normal heart size. No pericardial effusion. Thoracic aorta is normal in caliber Mediastinum/Nodes: No enlarged lymph nodes. Visualized thyroid is unremarkable. Lungs/Pleura: Minimal patchy ground-glass density the left lower lobe. No pleural effusion or pneumothorax. Upper Abdomen: No acute abnormality. Musculoskeletal: Sigmoid scoliosis with partially imaged extensive fusion construct. No acute osseous abnormality. Review of the MIP images confirms the above findings. IMPRESSION: Poor contrast bolus timing.  No central pulmonary embolus. Minimal left lower lobe patchy ground-glass density reflects atelectasis, less likely infectious/inflammatory. Electronically Signed   By: Jackquline Berlin.D.  On: 01/17/2021 08:03    Procedures Procedures (including critical care time)  Medications Ordered in UC Medications - No data to display  Initial Impression / Assessment and Plan / UC Course  I have reviewed the triage vital signs and the nursing notes.  Pertinent labs & imaging results that were  available during my care of the patient were reviewed by me and considered in my medical decision making (see chart for details).      This patient is a 35 year old female presenting with R knee and thigh swelling/ concern for DVT. Today she is tachycardic but vitals otherwise within normal range; oxygenating well on room air and denies subjective shortness of breath.   Ddx is likely cellulitis vs DVT vs other. Wells score is 3 due to tachycardia and immobilization. I have a low suspicion of DVT based on presentation; however given patient's concern for this and the fact that I cannot rule this out due to urgent care setting, I am recommending this patient go to [facility], ER for further evaluation management.  Patient verbalizes understanding and agreement. I will defer antibiotic management to ED provider.   She is currently hemodynamically stable for transport in personal vehicle by family member.   This chart was dictated using voice recognition software, Dragon. Despite the best efforts of this provider to proofread and correct errors, errors may still occur which can change documentation meaning.   Final Clinical Impressions(s) / UC Diagnoses   Final diagnoses:  Pain and swelling of lower leg, right  History of spinal cord injury  Ambulates with cane  Rheumatoid arthritis, involving unspecified site, unspecified whether rheumatoid factor present [facility])     Discharge Instructions     -Please head straight to [facility], ER for further evaluation of leg swelling and clot rule out. -Stop and call 911 if you experience shortness of breath, dizziness, chest pain, loss of consciousness.    ED Prescriptions    None     PDMP not reviewed this encounter.   Rhys Martini, PA-C 01/17/21 [phone]

## 2021-01-16 NOTE — ED Notes (Signed)
Patient is being discharged from the Urgent Care and sent to the Emergency Department via POV . Per Cheree Ditto, NP, patient is in need of higher level of care due to potential clot and need for higher level of care. Patient is aware and verbalizes understanding of plan of care.  Vitals:   01/16/21 1935  BP: 126/74  Pulse: (!) 108  Resp: 18  Temp: 97.8 F (36.6 C)  SpO2: 98%

## 2021-01-17 ENCOUNTER — Emergency Department (HOSPITAL_COMMUNITY): Payer: Medicare Other

## 2021-01-17 DIAGNOSIS — M7989 Other specified soft tissue disorders: Secondary | ICD-10-CM

## 2021-01-17 DIAGNOSIS — L0390 Cellulitis, unspecified: Secondary | ICD-10-CM | POA: Diagnosis present

## 2021-01-17 LAB — CBC
HCT: 37.8 % (ref 36.0–46.0)
HCT: 38.9 % (ref 36.0–46.0)
Hemoglobin: 12.9 g/dL (ref 12.0–15.0)
Hemoglobin: 13 g/dL (ref 12.0–15.0)
MCH: 31.9 pg (ref 26.0–34.0)
MCH: 31.1 pg (ref 26.0–34.0)
MCHC: 34.1 g/dL (ref 30.0–36.0)
MCHC: 33.4 g/dL (ref 30.0–36.0)
MCV: 93.6 fL (ref 80.0–100.0)
MCV: 93.1 fL (ref 80.0–100.0)
Platelets: 333 10*3/uL (ref 150–400)
Platelets: 334 10*3/uL (ref 150–400)
RBC: 4.04 MIL/uL (ref 3.87–5.11)
RBC: 4.18 MIL/uL (ref 3.87–5.11)
RDW: 12.4 % (ref 11.5–15.5)
RDW: 12.4 % (ref 11.5–15.5)
WBC: 8.8 10*3/uL (ref 4.0–10.5)
WBC: 7.3 10*3/uL (ref 4.0–10.5)
nRBC: 0 % (ref 0.0–0.2)
nRBC: 0 % (ref 0.0–0.2)

## 2021-01-17 LAB — BASIC METABOLIC PANEL
Anion gap: 8 (ref 5–15)
BUN: 19 mg/dL (ref 6–20)
CO2: 25 mmol/L (ref 22–32)
Calcium: 9.2 mg/dL (ref 8.9–10.3)
Chloride: 105 mmol/L (ref 98–111)
Creatinine, Ser: 0.71 mg/dL (ref 0.44–1.00)
GFR, Estimated: >60 mL/min (ref >60)
Glucose, Bld: 90 mg/dL (ref 70–99)
Potassium: 4.2 mmol/L (ref 3.5–5.1)
Sodium: 138 mmol/L (ref 135–145)

## 2021-01-17 LAB — RESP PANEL BY RT-PCR (FLU A&B, COVID) ARPGX2
Influenza A by PCR: NEGATIVE
Influenza B by PCR: NEGATIVE
SARS Coronavirus 2 by RT PCR: NEGATIVE

## 2021-01-17 LAB — CREATININE, SERUM
Creatinine, Ser: 0.75 mg/dL (ref 0.44–1.00)
GFR, Estimated: >60 mL/min (ref >60)

## 2021-01-17 LAB — HCG, QUANTITATIVE, PREGNANCY: hCG, Beta Chain, Quant, S: <1 m[IU]/mL (ref <5)

## 2021-01-17 LAB — LACTIC ACID, PLASMA: Lactic Acid, Venous: 1.4 mmol/L (ref 0.5–1.9)

## 2021-01-17 LAB — HIV ANTIBODY (ROUTINE TESTING W REFLEX): HIV Screen 4th Generation wRfx: REACTIVE — AB

## 2021-01-17 LAB — C-REACTIVE PROTEIN: CRP: 6.7 mg/dL — ABNORMAL HIGH (ref <1.0)

## 2021-01-17 LAB — SEDIMENTATION RATE: Sed Rate: 42 mm/hr — ABNORMAL HIGH (ref 0–22)

## 2021-01-17 MED ORDER — VANCOMYCIN HCL 1000 MG/200ML IV SOLN
1000.0000 mg | Freq: Two times a day (BID) | INTRAVENOUS | Status: DC
  Administered 2021-01-17 – 2021-01-19 (×4): 1000 mg via INTRAVENOUS
  Filled 2021-01-17 (×5): qty 200

## 2021-01-17 MED ORDER — TIZANIDINE HCL 4 MG PO TABS
4.0000 mg | ORAL_TABLET | Freq: Once | ORAL | Status: AC
  Administered 2021-01-17: 4 mg via ORAL
  Filled 2021-01-17: qty 1

## 2021-01-17 MED ORDER — GABAPENTIN 300 MG PO CAPS
900.0000 mg | ORAL_CAPSULE | Freq: Every day | ORAL | Status: DC
  Administered 2021-01-17 – 2021-01-18 (×2): 900 mg via ORAL
  Filled 2021-01-17 (×2): qty 3

## 2021-01-17 MED ORDER — NAPROXEN 250 MG PO TABS
500.0000 mg | ORAL_TABLET | Freq: Once | ORAL | Status: AC
  Administered 2021-01-17: 250 mg via ORAL
  Filled 2021-01-17: qty 2

## 2021-01-17 MED ORDER — BACLOFEN 10 MG PO TABS: 30.0000 mg | ORAL_TABLET | ORAL | Status: DC

## 2021-01-17 MED ORDER — HYDROMORPHONE HCL 2 MG PO TABS
1.0000 mg | ORAL_TABLET | Freq: Every evening | ORAL | Status: DC | PRN
  Administered 2021-01-17: 2 mg via ORAL
  Filled 2021-01-17: qty 1

## 2021-01-17 MED ORDER — ACETAMINOPHEN 650 MG RE SUPP: 650.0000 mg | Freq: Four times a day (QID) | RECTAL | Status: DC | PRN

## 2021-01-17 MED ORDER — TRAMADOL HCL 50 MG PO TABS
100.0000 mg | ORAL_TABLET | Freq: Three times a day (TID) | ORAL | Status: DC
Start: 2021-01-17 — End: 2021-01-18
  Administered 2021-01-17 – 2021-01-18 (×2): 100 mg via ORAL
  Filled 2021-01-17 (×2): qty 2

## 2021-01-17 MED ORDER — BACLOFEN 20 MG PO TABS
40.0000 mg | ORAL_TABLET | Freq: Three times a day (TID) | ORAL | Status: DC
  Administered 2021-01-17 – 2021-01-18 (×4): 40 mg via ORAL
  Filled 2021-01-17 (×6): qty 2
  Filled 2021-01-17: qty 4

## 2021-01-17 MED ORDER — SODIUM CHLORIDE 0.9 % IV SOLN
2.0000 g | Freq: Three times a day (TID) | INTRAVENOUS | Status: DC
  Administered 2021-01-17 – 2021-01-19 (×6): 2 g via INTRAVENOUS
  Filled 2021-01-17 (×6): qty 2

## 2021-01-17 MED ORDER — ACETAMINOPHEN 325 MG PO TABS
650.0000 mg | ORAL_TABLET | Freq: Four times a day (QID) | ORAL | Status: DC | PRN
  Administered 2021-01-18: 650 mg via ORAL
  Filled 2021-01-17: qty 2

## 2021-01-17 MED ORDER — HYDROMORPHONE HCL 1 MG/ML IJ SOLN: 0.5000 mg | Freq: Once | INTRAMUSCULAR | Status: DC | PRN

## 2021-01-17 MED ORDER — DROSPIRENONE-ETHINYL ESTRADIOL 3-0.02 MG PO TABS: 1.0000 | ORAL_TABLET | Freq: Every day | ORAL | Status: DC

## 2021-01-17 MED ORDER — AMPHETAMINE-DEXTROAMPHET ER 10 MG PO CP24
30.0000 mg | ORAL_CAPSULE | ORAL | Status: DC
Start: 2021-01-18 — End: 2021-01-18
  Administered 2021-01-18: 30 mg via ORAL
  Filled 2021-01-17 (×2): qty 3

## 2021-01-17 MED ORDER — PIPERACILLIN-TAZOBACTAM 3.375 G IVPB 30 MIN
3.3750 g | Freq: Once | INTRAVENOUS | Status: AC
  Administered 2021-01-17: 3.375 g via INTRAVENOUS
  Filled 2021-01-17: qty 50

## 2021-01-17 MED ORDER — LACTATED RINGERS IV BOLUS
1000.0000 mL | Freq: Once | INTRAVENOUS | Status: AC
  Administered 2021-01-17: 1000 mL via INTRAVENOUS

## 2021-01-17 MED ORDER — SERTRALINE HCL 50 MG PO TABS
75.0000 mg | ORAL_TABLET | Freq: Every day | ORAL | Status: DC
  Administered 2021-01-17: 75 mg via ORAL
  Administered 2021-01-18: 25 mg via ORAL
  Filled 2021-01-17 (×2): qty 1

## 2021-01-17 MED ORDER — TRAMADOL HCL 50 MG PO TABS
100.0000 mg | ORAL_TABLET | Freq: Once | ORAL | Status: AC
  Administered 2021-01-17: 100 mg via ORAL
  Filled 2021-01-17: qty 2

## 2021-01-17 MED ORDER — FOLIC ACID 1 MG PO TABS
1.0000 mg | ORAL_TABLET | Freq: Every day | ORAL | Status: DC
  Administered 2021-01-17: 1 mg via ORAL
  Filled 2021-01-17: qty 1

## 2021-01-17 MED ORDER — ASPIRIN 81 MG PO CHEW
81.0000 mg | CHEWABLE_TABLET | Freq: Every day | ORAL | Status: DC
  Administered 2021-01-17 – 2021-01-19 (×3): 81 mg via ORAL
  Filled 2021-01-17 (×4): qty 1

## 2021-01-17 MED ORDER — HYDROXYCHLOROQUINE SULFATE 200 MG PO TABS
200.0000 mg | ORAL_TABLET | Freq: Once | ORAL | Status: AC
  Administered 2021-01-17: 200 mg via ORAL
  Filled 2021-01-17: qty 1

## 2021-01-17 MED ORDER — VANCOMYCIN HCL 1000 MG/200ML IV SOLN
1000.0000 mg | Freq: Once | INTRAVENOUS | Status: AC
  Administered 2021-01-17: 1000 mg via INTRAVENOUS
  Filled 2021-01-17 (×2): qty 200

## 2021-01-17 MED ORDER — ENOXAPARIN SODIUM 40 MG/0.4ML ~~LOC~~ SOLN
40.0000 mg | SUBCUTANEOUS | Status: DC
  Filled 2021-01-17: qty 0.4

## 2021-01-17 MED ORDER — TRAZODONE HCL 50 MG PO TABS
50.0000 mg | ORAL_TABLET | Freq: Every day | ORAL | Status: DC
  Administered 2021-01-17: 50 mg via ORAL
  Filled 2021-01-17: qty 1

## 2021-01-17 MED ORDER — POLYETHYLENE GLYCOL 3350 17 G PO PACK: 17.0000 g | PACK | Freq: Every day | ORAL | Status: DC | PRN

## 2021-01-17 MED ORDER — IOHEXOL 350 MG/ML SOLN
75.0000 mL | Freq: Once | INTRAVENOUS | Status: AC | PRN
  Administered 2021-01-17: 75 mL via INTRAVENOUS

## 2021-01-17 NOTE — ED Provider Notes (Addendum)
Patient accepted at signout from Dr. Clayborne Dana.  Patient has had swollen, red right lower extremity for several days.  She had not noticed it much until yesterday.  Patient is getting DVT study.  CT PE study completed without any central pulmonary emboli.  Patient reports that she did have cellulitis in the opposite leg last summer and required hospitalization.  She reports she has pain with standing but at rest is not having much pain.  She has some sores on the lateral aspect of the right thigh.  She reports she got these because she has a type of deep tissue massage ball with prongs on it that created that wound.  She also reports that she fell on both knees a couple weeks ago and had some small wounds there as well.  She denies she had any documented fever or chills. Physical Exam  BP (!) 84/55   Pulse 74   Temp 98.7 F (37.1 C) (Oral)   Resp 20   Ht 5\' 2"  (1.575 m)   Wt 68 kg   SpO2 100%   BMI 27.42 kg/m   Physical Exam Constitutional:      Comments: Alert with clear mental status.  No respiratory distress.  Nontoxic in appearance.  Eyes:     Extraocular Movements: Extraocular movements intact.  Pulmonary:     Effort: Pulmonary effort is normal.  Skin:    General: Skin is warm and dry.  Psychiatric:        Mood and Affect: Mood normal.           ED Course/Procedures     Procedures  MDM  Patient has bright erythema and warmth to the right lower extremity.  This goes up above the level of the knee on the lateral calf.  She has several small wounds to the skin.  DVT study is negative.  PE study shows no central PE.  With patient having rheumatoid arthritis and immune compromised with history of prior cellulitis requiring hospitalization, I feel at this time patient needs inpatient IV antibiotics until improvement observed.  She reports the last time she has cellulitis it was resistant to Keflex and she required a different antibiotic set.  Currently with skin wounds will add  vancomycin for coverage of MRSA.   Consult for unassigned medical admission placed at 1005.  Placed another order for consult at 11: 55.  Awaiting admitting provider contact for admission disposition.     , MD 01/17/21 1012    01/19/21, MD 01/17/21 [phone]

## 2021-01-17 NOTE — Progress Notes (Signed)
Pharmacy Antibiotic Note  Ashlee [name] is a 35 y.o. female admitted on 01/16/2021 with wound infection.  Pharmacy has been consulted for Cefepime and vancomycin dosing.  SCr wnl, LA 1.4, WBC wnl.   Plan: -Cefepime 2 gm IV Q 8 hours -Vancomycin 1000 mg IV Q 12 hrs. Goal AUC 400-550. Expected AUC: 540 SCr used: 0.71 -Monitor CBC, renal fx, cultures and clinical progress -Vanc levels as indicated   Height: 5\' 2"  (157.5 cm) Weight: 68 kg (149 lb 14.6 oz) IBW/kg (Calculated) : 50.1  Temp (24hrs), Avg:98.3 F (36.8 C), Min:97.8 F (36.6 C), Max:98.7 F (37.1 C)  Recent Labs  Lab 01/17/21 0055 01/17/21 1035  WBC 8.8  --   CREATININE 0.71  --   LATICACIDVEN  --  1.4    Estimated Creatinine Clearance: 87.1 mL/min (by C-G formula based on SCr of 0.71 mg/dL).    Allergies  Allergen Reactions  . Other Dermatitis, Itching, Other (See Comments), Rash and Swelling     Blisters and redness, Medipore, Durapore and Transpore tapes by 29M Use paper tape Other reaction(s): Other (See Comments), Other (See Comments) Blisters and redness  Blisters and redness, Medipore, Durapore and Transpore tapes by 29M  . Tape Rash, Dermatitis, Itching and Swelling    Use paper tape Other reaction(s): Other (See Comments), Other (See Comments) Blisters and redness  Blisters and redness, Medipore, Durapore and Transpore tapes by 29M   . Juniper Tar     Other reaction(s): Other (See Comments) Contact dermatitis    Antimicrobials this admission: Cefepime 3/18 >>  Vanc 3/18 >>   Dose adjustments this admission:  Microbiology results: 3/18 BCx:    Thank you for allowing pharmacy to be a part of this patient's care.  4/18, PharmD., BCPS, BCCCP Clinical Pharmacist Please refer to [facility] for unit-specific pharmacist

## 2021-01-17 NOTE — Progress Notes (Signed)
Per patient, she will resume birth control at home.

## 2021-01-17 NOTE — H&P (Addendum)
Date: 01/17/2021               Patient Name:  Ashlee [name] MRN: [id number]  DOB: 09-21-1986 Age / Sex: 35 y.o., female   PCP: Argentina Donovan, MD         Medical Service: Internal Medicine Teaching Service         Attending Physician: Dr. Arby Barrette, MD    First Contact: Dr. Laddie Aquas Pager: [phone]  Second Contact: Dr. Marchia Bond Pager: [phone]       After Hours (After 5p/  First Contact Pager: [phone]  weekends / holidays): Second Contact Pager: [phone]   Chief Complaint: Cellulitis  History of Present Illness:   Ashlee [name] is a 34 y.o. woman w/ PMHx Rheumatoid Arthritis, Spinal stenosis, hx Spinal Cord Injury, Sciatica, PFO, MVP, Seizures, Migraines, RLS, anxiety, and chronic allergies, presenting due to concern for RLE infection. She states she first noticed swelling of right lower leg and foot about 5 days ago and developed redness over her right lateral leg just above her knee shortly thereafter. This redness has slowly spread down her right leg into her foot and has been associated with significant swelling. Both her swelling and her pain are different from her symptoms with RA. She has been taking her temperature at home with a T-Max of 99.5. She has had some tingling on the medial side of her plantar right foot, which she attributes to swelling. Her pain worsens with movement and weight bearing. She denies subjective fevers or chills, new weakness, shortness of breath, cough, CP, abdominal pain, nausea, vomiting or any other symptoms.   She wonders whether she requires admission. She states she had cellulitis last July, although at that time was febrile to 103 degrees with a WBC of 15 with severe pain. She notes she was unresponsive to Keflex in the past and was treated with a prolonged course of Doxycycline and Amoxicillin with resolution of her infection. She has not taken methotrexate for some time and had just started taking Plaquenil again for her RA about 1 week ago. She  had been off of it for about 1 month prior.   ED Course: Patient presented with tachycardia but was otherwise HDS with unremarkable lab work. LE ultrasound and CTA chest without evidence for DVT or PE. She did develop transient hypotension that resolved with 1L IVF bolus.   Home Meds: Current Meds  Medication Sig  . acetaminophen (TYLENOL) 325 MG tablet Take 325 mg by mouth every 6 (six) hours as needed for pain.   Marland Kitchen amphetamine-dextroamphetamine (ADDERALL XR) 30 MG 24 hr capsule Take 30 mg by mouth every morning.  Marland Kitchen aspirin 81 MG chewable tablet Chew 81 mg by mouth daily.  Marland Kitchen b complex vitamins capsule Take 1 capsule by mouth daily.  . baclofen (LIORESAL) 10 MG tablet Take 30-40 mg by mouth See admin instructions. 4 tablet in the morning 3 tablets in the afternoon 4 tablets at bedtime  . cetirizine (ZYRTEC) 10 MG tablet Take 10 mg by mouth 2 (two) times daily.  . diphenhydrAMINE (BENADRYL) 25 mg capsule Take 25 mg by mouth daily as needed for allergies.  . ferrous sulfate 325 (65 FE) MG tablet Take 1 tablet by mouth daily.  . fexofenadine (ALLEGRA) 60 MG tablet Take 60 mg by mouth daily as needed for allergies.  . folic acid (FOLVITE) 1 MG tablet Take 1 mg by mouth at bedtime.   . gabapentin (NEURONTIN) 300 MG capsule Take 900 mg  by mouth at bedtime.  Marland Kitchen HYDROmorphone (DILAUDID) 2 MG tablet Take 2 mg by mouth 2 (two) times daily as needed for moderate pain. For pain  . hydroxychloroquine (PLAQUENIL) 200 MG tablet Take 200 mg by mouth See admin instructions. Alternate every other day with  by mouth twice daily, then  by mouth once daily, then repeat.  . Magnesium 250 MG TABS Take 250 mg by mouth daily.  . melatonin 3 MG TABS tablet Take 3 mg by mouth at bedtime.  . Menaquinone-7 (VITAMIN K2 PO) Take 1 tablet by mouth daily.  . Multiple Vitamins-Minerals (ALIVE WOMENS ENERGY PO) Take 1 tablet by mouth daily.  . naloxone (NARCAN) nasal spray 4 mg/0.1 mL Place 4 mg into the nose as  needed.  . naproxen (NAPROSYN) 500 MG tablet Take 500 mg by mouth 2 (two) times daily.  Marland Kitchen NIKKI 3-0.02 MG tablet Take 1 tablet by mouth daily.  . polyethylene glycol powder (GLYCOLAX/MIRALAX) 17 GM/SCOOP powder Take 17 g by mouth daily as needed for mild constipation.  . sertraline (ZOLOFT) 50 MG tablet Take 75 mg by mouth daily.  Marland Kitchen spironolactone (ALDACTONE) 50 MG tablet Take 50 mg by mouth daily.  . SUMAtriptan (IMITREX) 25 MG tablet Take 25 mg by mouth daily as needed for migraine. For migraine  . tiZANidine (ZANAFLEX) 4 MG capsule Take 4 mg by mouth 3 (three) times daily.  . traMADol (ULTRAM-ER) 100 MG 24 hr tablet Take 100 mg by mouth 3 (three) times daily.  . traZODone (DESYREL) 50 MG tablet Take 50 mg by mouth at bedtime.  . Vitamin D, Cholecalciferol, 25 MCG (1000 UT) TABS Take 1,000 Units by mouth daily.     Allergies: Allergies as of 01/16/2021 - Review Complete 01/16/2021  Allergen Reaction Noted  . Other Dermatitis, Itching, Other (See Comments), Rash, and Swelling 04/26/2012  . Tape Rash, Dermatitis, Itching, and Swelling 04/26/2012  . Juniper tar  03/23/2016   Past Medical History:  Diagnosis Date  . Allergy    some nuts, juniper bushes  . Anxiety   . Back pain   . GERD (gastroesophageal reflux disease)   . Migraines   . Mitral valve prolapse   . Patent foramen ovale   . Restless leg syndrome   . Rheumatoid arthritis(714.0)   . Sciatica   . Scoliosis   . Spinal cord disease (HCC)     Family History:  Family History  Problem Relation Age of Onset  . Healthy Mother   . Heart attack Father   Her great grandmother had severe RA. Aunt had Lymphoma.   Social History:  Patient lives at home.  She denies any history of tobacco use, IV drug use, denies alcohol use or any other illicit drug use.  Review of Systems: A complete ROS was negative except as per HPI.   Physical Exam: Blood pressure 104/61, pulse 86, temperature 98.7 F (37.1 C), temperature source  Oral, resp. rate 18, height  (1.575 m), weight 68 kg, SpO2 99 %.  General: Patient appears well. No acute distress. Eyes: Sclera non-icteric. No conjunctival injection.  HENT: MMM. No nasal discharge. Respiratory: Lungs are CTA, bilaterally. No wheezes, rales, or rhonchi.  Cardiovascular: Regular rate and rhythm. 1/6 systolic blowing murmur in the left lower chest. No other m/r/g. There is 1+ RLE pitting edema to the knee. No LLE pitting edema.  MSK: There is restricted range of motion of the spine with loss of lumbar and cervical lordosis. There is 4/6 strength of  bilateral lower extremities. RLE is mildly tender to palpation. There is no bony joint tenderness of the bilateral lower extremities.  Neurological: There is dysesthesia of the medial aspect of the right plantar foot. Sensation otherwise intact to light touch in bilateral lower extremities. Alert and oriented x 3.  Abdominal: Distended. Soft and non-tender to palpation. Bowel sounds intact. No rebound or guarding. Skin: There is patchy, non-raised erythema over the RLE, most focal over the right lateral knee, extending over the shin and into the foot. Skin appears shiny and is warm and dry. There are two 2mm circular punctate lesions, 1 over the right calf and 1 over the right thigh. There is a superficial scabbed lesion below the right knee. There is a sub-centimeter superficial lesion with possible purulence over the left dorsal foot without significant surrounding swelling or redness. Psych: Normal affect. Normal tone of voice.   EKG: personally reviewed my interpretation is NSR. There are Q waves in inferior leads, consistent with hx of cardiac ischemia and borderline T wave inversions in V2, although otherwise no acute concerns for ischemia. Normal PR, QRS, and QTc intervals.   RLE DVT Study: No evidence of DVT.  CT ANGIOGRAPHY CHEST WITH CONTRAST COMPARISON:  None. FINDINGS: Cardiovascular: Poor bolus. There is filling  defect in the main or right and left pulmonary arteries. Normal heart size. No pericardial effusion. Thoracic aorta is normal in caliber Mediastinum/Nodes: No enlarged lymph nodes. Visualized thyroid is unremarkable. Lungs/Pleura: Minimal patchy ground-glass density the left lower lobe. No pleural effusion or pneumothorax. Upper Abdomen: No acute abnormality. Musculoskeletal: Sigmoid scoliosis with partially imaged extensive fusion construct. No acute osseous abnormality. Review of the MIP images confirms the above findings.  IMPRESSION: Poor contrast bolus timing.  No central pulmonary embolus. Minimal left lower lobe patchy ground-glass density reflects atelectasis, less likely infectious/inflammatory.  Electronically Signed   By: Guadlupe Spanish M.D.   On: 01/17/2021 08:03  Assessment & Plan by Problem: Active Problems:   * No active hospital problems. *  # Non-purulent Cellulitis of the RLE Patient has RLE redness, warmth, swelling and pain concerning for cellulitis that has worsened over the past 5 days. She is currently immunosuppressed on plaquenil for RA. She had cellulitis last July that was resistant to Keflex in the past. Her infection resolved with Doxycycline and Amoxicillin for 12 days. In the ED, she was started on Vancomycin and Zosyn. She has been afebrile, is currently HDS, with normal lactic acid and without leukocytosis. Bedside ultrasound was concerning for fluid collections overlying the medial malleolus and anterior right ankle.  - Check MRI of RLE up to the thigh and LLE up to the knee - Switch antibiotics to vancomycin and cefepime - Continue to monitor daily CBC's   # Hypotension Blood pressure as low as 84/55 in the ED, although otherwise HDS. Hypotension resolved s/p 1L LR bolus in the ED.  - Continue to monitor   # Rheumatoid Arthritis Patient has been taking Plaquenil at home over the last week, recently restarted after a few month hiatus. She no  longer takes methotrexate. Concern for immunosuppression.  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 She states she has decreased sensation and strength in her extremities although neither of  which are completely lost. Experiences a considerable amount of pain on a daily basis for which she takes multiple analgesic medications at home.  - Continue home medications:  - Tramadol 100mg  TID  - Dilaudid 1-2mg  PO PRN nightly  - Baclofen 40mg  TID  - Gabapentin 900mg  nightly   # LLL Patchy Ground-Glass Density  CTA chest showed minimal LLL patchy ground-glass density, consistent with atelectasis vs. Infectious/inflammatory process. She does not have any upper respiratory symptoms, fever, or leukocytosis to suggest active infection. Likely represents atalectasis in the setting of decreased mobility secondary to spinal cord injury vs. Scarring in the setting of COVID-19 infection last January. Patient has been afebrile without leukocytosis.  - Continue to monitor daily CBC's  # Anxiety # ADHD - Continue home seroquel and adderall   Code Status: Full Code  Diet: Regular  DVT PPx: Enoxaparin  Fluids: None   Dispo: Admit patient to Observation with expected length of stay less than 2 midnights.  Signed: , MD 01/17/2021, 12:53 PM  Pager: [phone] After 5pm on weekdays and 1pm on weekends: On Call pager: [phone]

## 2021-01-17 NOTE — Progress Notes (Signed)
Right lower extremity venous duplex has been completed. Preliminary results can be found in CV Proc through chart review.  Results were given to Dr. Donnald Garre.  01/17/21 9:48 AM Olen Cordial RVT

## 2021-01-17 NOTE — ED Notes (Addendum)
Up to bathroom

## 2021-01-18 ENCOUNTER — Observation Stay (HOSPITAL_COMMUNITY): Payer: Medicare Other

## 2021-01-18 ENCOUNTER — Encounter (HOSPITAL_COMMUNITY): Payer: Self-pay | Admitting: Internal Medicine

## 2021-01-18 DIAGNOSIS — Z79890 Hormone replacement therapy (postmenopausal): Secondary | ICD-10-CM | POA: Diagnosis not present

## 2021-01-18 DIAGNOSIS — M069 Rheumatoid arthritis, unspecified: Secondary | ICD-10-CM | POA: Diagnosis present

## 2021-01-18 DIAGNOSIS — F419 Anxiety disorder, unspecified: Secondary | ICD-10-CM | POA: Diagnosis present

## 2021-01-18 DIAGNOSIS — G839 Paralytic syndrome, unspecified: Secondary | ICD-10-CM | POA: Diagnosis present

## 2021-01-18 DIAGNOSIS — M5430 Sciatica, unspecified side: Secondary | ICD-10-CM | POA: Diagnosis present

## 2021-01-18 DIAGNOSIS — Z79899 Other long term (current) drug therapy: Secondary | ICD-10-CM | POA: Diagnosis not present

## 2021-01-18 DIAGNOSIS — Z20822 Contact with and (suspected) exposure to covid-19: Secondary | ICD-10-CM | POA: Diagnosis present

## 2021-01-18 DIAGNOSIS — L03115 Cellulitis of right lower limb: Principal | ICD-10-CM

## 2021-01-18 DIAGNOSIS — G2581 Restless legs syndrome: Secondary | ICD-10-CM | POA: Diagnosis present

## 2021-01-18 DIAGNOSIS — Z7982 Long term (current) use of aspirin: Secondary | ICD-10-CM | POA: Diagnosis not present

## 2021-01-18 DIAGNOSIS — M4800 Spinal stenosis, site unspecified: Secondary | ICD-10-CM | POA: Diagnosis present

## 2021-01-18 DIAGNOSIS — Q211 Atrial septal defect: Secondary | ICD-10-CM | POA: Diagnosis not present

## 2021-01-18 DIAGNOSIS — L0390 Cellulitis, unspecified: Secondary | ICD-10-CM | POA: Diagnosis present

## 2021-01-18 DIAGNOSIS — Z91048 Other nonmedicinal substance allergy status: Secondary | ICD-10-CM | POA: Diagnosis not present

## 2021-01-18 DIAGNOSIS — G8929 Other chronic pain: Secondary | ICD-10-CM | POA: Diagnosis present

## 2021-01-18 DIAGNOSIS — F909 Attention-deficit hyperactivity disorder, unspecified type: Secondary | ICD-10-CM | POA: Diagnosis present

## 2021-01-18 DIAGNOSIS — Z8261 Family history of arthritis: Secondary | ICD-10-CM | POA: Diagnosis not present

## 2021-01-18 DIAGNOSIS — I959 Hypotension, unspecified: Secondary | ICD-10-CM | POA: Diagnosis present

## 2021-01-18 LAB — COMPREHENSIVE METABOLIC PANEL
ALT: 24 U/L (ref 0–44)
AST: 21 U/L (ref 15–41)
Albumin: 3 g/dL — ABNORMAL LOW (ref 3.5–5.0)
Alkaline Phosphatase: 40 U/L (ref 38–126)
Anion gap: 8 (ref 5–15)
BUN: 18 mg/dL (ref 6–20)
CO2: 22 mmol/L (ref 22–32)
Calcium: 9.1 mg/dL (ref 8.9–10.3)
Chloride: 107 mmol/L (ref 98–111)
Creatinine, Ser: 0.63 mg/dL (ref 0.44–1.00)
GFR, Estimated: >60 mL/min (ref >60)
Glucose, Bld: 93 mg/dL (ref 70–99)
Potassium: 4.1 mmol/L (ref 3.5–5.1)
Sodium: 137 mmol/L (ref 135–145)
Total Bilirubin: 0.3 mg/dL (ref 0.3–1.2)
Total Protein: 6 g/dL — ABNORMAL LOW (ref 6.5–8.1)

## 2021-01-18 LAB — CBC
HCT: 36.4 % (ref 36.0–46.0)
Hemoglobin: 12.1 g/dL (ref 12.0–15.0)
MCH: 31.1 pg (ref 26.0–34.0)
MCHC: 33.2 g/dL (ref 30.0–36.0)
MCV: 93.6 fL (ref 80.0–100.0)
Platelets: 307 10*3/uL (ref 150–400)
RBC: 3.89 MIL/uL (ref 3.87–5.11)
RDW: 12.4 % (ref 11.5–15.5)
WBC: 8.5 10*3/uL (ref 4.0–10.5)
nRBC: 0 % (ref 0.0–0.2)

## 2021-01-18 MED ORDER — TRAZODONE HCL 50 MG PO TABS
50.0000 mg | ORAL_TABLET | Freq: Every day | ORAL | Status: DC
  Administered 2021-01-18: 50 mg via ORAL
  Filled 2021-01-18: qty 1

## 2021-01-18 MED ORDER — BACLOFEN 20 MG PO TABS: 40.0000 mg | ORAL_TABLET | Freq: Two times a day (BID) | ORAL | Status: DC

## 2021-01-18 MED ORDER — BACLOFEN 20 MG PO TABS: 20.0000 mg | ORAL_TABLET | Freq: Every day | ORAL | Status: DC

## 2021-01-18 MED ORDER — AMPHETAMINE-DEXTROAMPHET ER 10 MG PO CP24
30.0000 mg | ORAL_CAPSULE | ORAL | Status: DC
  Administered 2021-01-19: 30 mg via ORAL
  Filled 2021-01-18: qty 3

## 2021-01-18 MED ORDER — TRAMADOL HCL ER 100 MG PO TB24
100.0000 mg | ORAL_TABLET | Freq: Three times a day (TID) | ORAL | Status: DC
Start: 2021-01-18 — End: 2021-01-19
  Administered 2021-01-18 – 2021-01-19 (×3): 100 mg via ORAL

## 2021-01-18 MED ORDER — BACLOFEN 20 MG PO TABS
30.0000 mg | ORAL_TABLET | Freq: Every day | ORAL | Status: DC
  Administered 2021-01-18: 30 mg via ORAL
  Filled 2021-01-18: qty 1

## 2021-01-18 MED ORDER — TIZANIDINE HCL 4 MG PO TABS
4.0000 mg | ORAL_TABLET | Freq: Three times a day (TID) | ORAL | Status: DC
  Administered 2021-01-18 – 2021-01-19 (×2): 4 mg via ORAL
  Filled 2021-01-18 (×2): qty 1

## 2021-01-18 MED ORDER — BACLOFEN 20 MG PO TABS
40.0000 mg | ORAL_TABLET | Freq: Two times a day (BID) | ORAL | Status: DC
  Administered 2021-01-18 – 2021-01-19 (×2): 40 mg via ORAL
  Filled 2021-01-18 (×2): qty 2

## 2021-01-18 MED ORDER — SERTRALINE HCL 25 MG PO TABS: 75.0000 mg | ORAL_TABLET | Freq: Every day | ORAL | Status: DC

## 2021-01-18 MED ORDER — HYDROMORPHONE HCL 2 MG PO TABS
2.0000 mg | ORAL_TABLET | Freq: Two times a day (BID) | ORAL | Status: DC
Start: 2021-01-18 — End: 2021-01-18
  Administered 2021-01-18: 2 mg via ORAL
  Filled 2021-01-18 (×2): qty 1

## 2021-01-18 MED ORDER — MELATONIN 3 MG PO TABS
3.0000 mg | ORAL_TABLET | Freq: Every day | ORAL | Status: DC
  Administered 2021-01-18: 3 mg via ORAL
  Filled 2021-01-18: qty 1

## 2021-01-18 MED ORDER — SERTRALINE HCL 50 MG PO TABS
50.0000 mg | ORAL_TABLET | Freq: Every day | ORAL | Status: AC
  Administered 2021-01-18: 50 mg via ORAL
  Filled 2021-01-18: qty 1

## 2021-01-18 MED ORDER — HYDROMORPHONE HCL 2 MG PO TABS
2.0000 mg | ORAL_TABLET | Freq: Two times a day (BID) | ORAL | Status: DC | PRN
  Administered 2021-01-18: 2 mg via ORAL
  Filled 2021-01-18: qty 1

## 2021-01-18 NOTE — Progress Notes (Addendum)
Patient stating that she will fall if she does not get her Zanaflex as she is scheduled at home. Patient states," it will be the hospital's responsibility if she falls". Placed patient on high fall risk. Bed alarm on, yellow socks on, staff informed. Patient is upset with these changes, educated patient that of she felt like she was going to fall she would need staff in the room when she was to get up out of bed. MD is aware of medication request.

## 2021-01-18 NOTE — Progress Notes (Signed)
Subjective:   Ashlee [name] resting in bed.  She states that her swelling in redness has improved in her bilateral lower extremities.  She does admit to some concern regarding her home medication regimen and would like her home medications continued.  I counseled her regarding her plan for lower extremity imaging and continued antibiotics.  She agrees with the plan.  Objective:  Vital signs in last 24 hours: Vitals:   01/17/21 1600 01/17/21 1805 01/17/21 2341 01/18/21 0603  BP: (!) 105/49 121/70 130/83 116/70  Pulse: 91 99 80   Resp:  19 18 17   Temp:  100.1 F (37.8 C) 99.1 F (37.3 C) 98.9 F (37.2 C)  TempSrc:  Oral Oral Axillary  SpO2: 98% 98% 97% 97%  Weight:      Height:       Physical Exam Constitutional:      Appearance: Normal appearance.  HENT:     Head: Normocephalic and atraumatic.  Eyes:     Extraocular Movements: Extraocular movements intact.  Cardiovascular:     Rate and Rhythm: Normal rate.     Pulses: Normal pulses.     Heart sounds: Normal heart sounds.  Pulmonary:     Effort: Pulmonary effort is normal.     Breath sounds: Normal breath sounds.  Abdominal:     General: Bowel sounds are normal.     Palpations: Abdomen is soft.     Tenderness: There is no abdominal tenderness.  Musculoskeletal:        General: Swelling and tenderness present. Normal range of motion.     Cervical back: Normal range of motion.     Comments: Erythema around ankles bilaterally and right knee has improved. Remains tender to palpation.  Skin:    General: Skin is warm and dry.  Neurological:     Mental Status: She is alert and oriented to person, place, and time. Mental status is at baseline.  Psychiatric:        Mood and Affect: Mood normal.      Assessment/Plan:  Active Problems:   Cellulitis  # Non-purulent Cellulitis of the RLE Has mild improvement of her lower extremity cellulitis since starting IV antibiotics.  Current plan is to get lower extremity MRIs to rule  out soft tissue fluid collections such as abscesses.  Will narrow her antibiotics to vancomycin alone today.  She does not have any other systemic signs or symptoms of infection currently.  We will continue to hold her Plaquenil in the setting of her lower extremity soft tissue infection. -MRI of right ankle and right knee/femur.  She will likely need an MRI of her left ankle and left knee tomorrow. -Narrow to vancomycin alone and discontinue cefepime -We will continue to monitor progression daily.  # Hypotension Patient continues to have mildly low blood pressures but otherwise is asymptomatic.  She does not admit to any signs of orthostasis.  # Rheumatoid Arthritis -Continue to hold Plaquenil in the setting of her lower extremity infection.  # Chronic Pain  # Spinal Stenosis  # Hx of Spinal Cord Injury  We will continue patient's home medications.  We will restart her tizanidine and her regularly scheduled Dilaudid twice daily.  Patient would like to speak to pharmacy regarding her home medication schedule.  We will reach out to them regarding this matter. - Continue home medications: Tramadol 100mg  TID, Dilaudid 1-2mg  twice daily, Baclofen 40mg  TID, Gabapentin 900mg  nightly  - Restart home Tizanidin  # Anxiety # ADHD -  Continue home seroquel and adderall   Code Status: Full Code  Diet: Regular  DVT PPx: Enoxaparin  Fluids: None  Prior to Admission Living Arrangement: Anticipated Discharge Location: Barriers to Discharge: Dispo: Anticipated discharge in approximately 1 day(s).    Chari Manning, D.O.  Internal Medicine Resident, PGY-2 [facility]  Pager: [phone] 1:51 PM, 01/18/2021    After 5pm on weekdays and 1pm on weekends: On Call pager [phone]

## 2021-01-18 NOTE — Progress Notes (Signed)
Pt concerned why her home medication, ZANAFLEX, was not continued while in hospital. Also concerned that she can only have Dilaudid PO once at bedtime where she can have it twice per day as needed at home. Will relay this to day shift nurse and leave this note in hopes that this will be discussed with her. She states that no one informed her of this.

## 2021-01-18 NOTE — Plan of Care (Signed)

## 2021-01-18 NOTE — Progress Notes (Signed)
  Date: 01/18/2021  Patient name: Ashlee [name]  Medical record number: [id number]  Date of birth: May 07, 1986   I have seen and evaluated Ashlee [name] and discussed their care with the Residency Team.  In brief, patient is a 35 year old female with a past medical history of RA, spinal stenosis, history of spinal cord injury, sciatica, PFO, MVP, seizures, migraines, restless leg syndrome, anxiety and chronic allergies who presented to the ED with worsening pain and swelling in her right lower extremity.  Patient states that approximately 5 days ago she noted swelling of her right lower leg and foot and developed redness over the lateral aspect of her right leg just above her knee.  This redness spread down her leg and into her foot.  Patient does complain of swelling and pain in her right lower extremity which is different from her normal RA pain. Patient did have an admission for cellulitis last July and required a prolonged course of doxycycline and amoxicillin with resolution of her infection patient is currently on Plaquenil which she restarted approximately 1 week ago for her RA.  No fevers or chills, no new weakness, no chest pain, no shortness of breath, no palpitations, no lightheadedness, no syncope, no nausea or vomiting, no diarrhea, no abdominal pain.  Today, patient states that her right lower extremity pain is improving and that the redness is improving as well.  She still has swelling in her right foot.  PMHx, Fam Hx, and/or Soc Hx : As per resident admit note  Vitals:   01/18/21 0732 01/18/21 1207  BP: (!) 112/52 136/90  Pulse: 73 (!) 104  Resp: 19 19  Temp: 98.2 F (36.8 C) 99.7 F (37.6 C)  SpO2: 99% 98%   General: Awake, alert, and x3, NAD CVS: Regular rhythm, normal heart sounds Lungs: CTA bilaterally Abdomen: Soft, nontender, nondistended, normoactive bowel sounds Extremities: Improving erythema over bilateral ankles as well as right knee.  Patient with mild to moderate  tenderness on palpation of her right lower extremity.  Patient does have mildly increased local warmth over her right lower extremity as well.  Swelling noted over right leg and foot. HEENT: Normocephalic, atraumatic Psych: Normal mood and affect Skin: Erythema noted over right lower extremity  Assessment and Plan: I have seen and evaluated the patient as outlined above. I agree with the formulated Assessment and Plan as detailed in the residents' note, with the following changes:   1.  Right lower extremity cellulitis: -Patient presented to the ED with worsening pain and swelling in her right lower extremity in the setting of a history of RA on Plaquenil as well as an episode of cellulitis last year. -Patient was started on cefepime and vancomycin.  Will transition patient to vancomycin alone for now.  Her erythema/swelling and pain are slowly improving.  We will continue to monitor closely -We will follow-up MRI of her right ankle as well as right knee/femur -No leukocytosis or fevers noted -If patient continues to improve would consider possible DC home tomorrow on doxycycline for prolonged course -We will hold Plaquenil for now  Earl Lagos, MD 3/19/20222:06 PM

## 2021-01-19 LAB — BASIC METABOLIC PANEL
Anion gap: 11 (ref 5–15)
BUN: 16 mg/dL (ref 6–20)
CO2: 24 mmol/L (ref 22–32)
Calcium: 9.3 mg/dL (ref 8.9–10.3)
Chloride: 104 mmol/L (ref 98–111)
Creatinine, Ser: 0.67 mg/dL (ref 0.44–1.00)
GFR, Estimated: >60 mL/min (ref >60)
Glucose, Bld: 117 mg/dL — ABNORMAL HIGH (ref 70–99)
Potassium: 3.9 mmol/L (ref 3.5–5.1)
Sodium: 139 mmol/L (ref 135–145)

## 2021-01-19 LAB — CBC
HCT: 38.1 % (ref 36.0–46.0)
Hemoglobin: 13 g/dL (ref 12.0–15.0)
MCH: 31.6 pg (ref 26.0–34.0)
MCHC: 34.1 g/dL (ref 30.0–36.0)
MCV: 92.5 fL (ref 80.0–100.0)
Platelets: 407 10*3/uL — ABNORMAL HIGH (ref 150–400)
RBC: 4.12 MIL/uL (ref 3.87–5.11)
RDW: 12.5 % (ref 11.5–15.5)
WBC: 7.7 10*3/uL (ref 4.0–10.5)
nRBC: 0 % (ref 0.0–0.2)

## 2021-01-19 MED ORDER — HYDROCORTISONE 1 % EX CREA
TOPICAL_CREAM | Freq: Two times a day (BID) | CUTANEOUS | Status: DC
  Filled 2021-01-19: qty 28
  Filled 2021-01-19: qty 28.35

## 2021-01-19 MED ORDER — DOXYCYCLINE HYCLATE 100 MG PO TABS: 100.0000 mg | ORAL_TABLET | Freq: Two times a day (BID) | ORAL | Status: DC

## 2021-01-19 MED ORDER — HYDROCORTISONE 0.5 % EX CREA: TOPICAL_CREAM | Freq: Two times a day (BID) | CUTANEOUS | 0 refills | Status: AC

## 2021-01-19 MED ORDER — DOXYCYCLINE HYCLATE 100 MG PO TABS: 100.0000 mg | ORAL_TABLET | Freq: Two times a day (BID) | ORAL | 0 refills | Status: AC

## 2021-01-19 NOTE — Discharge Summary (Signed)
Name: Ashlee [name] MRN: [id number] DOB: January 31, 1986 35 y.o. PCP: Purvis Kilts, MD  Date of Admission: 01/16/2021  9:10 PM Date of Discharge: 01/19/2021 Attending Physician: Dr. Dareen Piano  DISCHARGE DIAGNOSIS:  Principal Problem:   Cellulitis Active Problems:   Rheumatoid arthritis ([place])   DISCHARGE MEDICATIONS:   Allergies as of 01/19/2021      Reactions   Other Dermatitis, Itching, Other (See Comments), Rash, Swelling   Blisters and redness, Medipore, Durapore and Transpore tapes by 66M Use paper tape Other reaction(s): Other (See Comments), Other (See Comments) Blisters and redness Blisters and redness, Medipore, Durapore and Transpore tapes by 66M   Tape Rash, Dermatitis, Itching, Swelling   Use paper tape Other reaction(s): Other (See Comments), Other (See Comments) Blisters and redness Blisters and redness, Medipore, Durapore and Transpore tapes by [employer]    Other reaction(s): Other (See Comments) Contact dermatitis      Medication List    STOP taking these medications   hydroxychloroquine 200 MG tablet Commonly known as: PLAQUENIL     TAKE these medications   acetaminophen 500 MG tablet Commonly known as: TYLENOL Take 500 mg by mouth every 6 (six) hours as needed for mild pain.   ALIVE WOMENS ENERGY PO Take 1 tablet by mouth daily.   amphetamine-dextroamphetamine 30 MG 24 hr capsule Commonly known as: ADDERALL XR Take 30 mg by mouth every morning. Takes at 8 AM   aspirin 81 MG chewable tablet Chew 81 mg by mouth daily.   b complex vitamins capsule Take 1 capsule by mouth daily.   baclofen 10 MG tablet Commonly known as: LIORESAL Take 30-40 mg by mouth See admin instructions. 4 tablet in the morning at 8 AM 3 tablets in the afternoon between 1 and 2 PM 4 tablets at bedtime at 9 PM   cetirizine 10 MG tablet Commonly known as: ZYRTEC Take 10 mg by mouth 2 (two) times daily. Takes at 8 AM and 9 PM   diphenhydrAMINE 25 mg  capsule Commonly known as: BENADRYL Take 25 mg by mouth daily as needed for allergies.   doxycycline 100 MG tablet Commonly known as: VIBRA-TABS Take 1 tablet (100 mg total) by mouth every 12 (twelve) hours for 10 days.   ferrous sulfate 325 (65 FE) MG tablet Take 1 tablet by mouth daily.   fexofenadine 60 MG tablet Commonly known as: ALLEGRA Take 60 mg by mouth daily as needed for allergies.   folic acid 1 MG tablet Commonly known as: FOLVITE Take 1 mg by mouth at bedtime.   gabapentin 300 MG capsule Commonly known as: NEURONTIN Take 900 mg by mouth at bedtime. Patient takes $RemoveBefor'600mg'[id number]$  at 9 PM and 300 mg at 11 PM   hydrocortisone cream 0.5 % Apply topically 2 (two) times daily.   HYDROmorphone 2 MG tablet Commonly known as: DILAUDID Take 2 mg by mouth 2 (two) times daily as needed for moderate pain. For pain   Magnesium 250 MG Tabs Take 250 mg by mouth daily.   melatonin 3 MG Tabs tablet Take 3 mg by mouth at bedtime.   naloxone 4 MG/0.1ML Liqd nasal spray kit Commonly known as: NARCAN Place 4 mg into the nose as needed.   naproxen 500 MG tablet Commonly known as: NAPROSYN Take 500 mg by mouth 2 (two) times daily.   Nikki 3-0.02 MG tablet Generic drug: drospirenone-ethinyl estradiol Take 1 tablet by mouth daily.   polyethylene glycol powder 17 GM/SCOOP powder Commonly known as:  GLYCOLAX/MIRALAX Take 17 g by mouth daily as needed for mild constipation.   sertraline 50 MG tablet Commonly known as: ZOLOFT Take 75 mg by mouth at bedtime.   spironolactone 50 MG tablet Commonly known as: ALDACTONE Take 50 mg by mouth daily.   SUMAtriptan 25 MG tablet Commonly known as: IMITREX Take 25 mg by mouth daily as needed for migraine. For migraine   tiZANidine 4 MG capsule Commonly known as: ZANAFLEX Take 4 mg by mouth 3 (three) times daily.   traMADol 100 MG 24 hr tablet Commonly known as: ULTRAM-ER Take 100 mg by mouth 3 (three) times daily.   traZODone 50 MG  tablet Commonly known as: DESYREL Take 50 mg by mouth at bedtime.   Vitamin D (Cholecalciferol) 25 MCG (1000 UT) Tabs Take 1,000 Units by mouth daily.   VITAMIN K2 PO Take 1 tablet by mouth daily.       DISPOSITION AND FOLLOW-UP:  Ms.Ashlee [name] was discharged from [facility] in Good condition.  At the hospital follow up visit please address:  Follow-up Recommendations:  A. Cellulitis: Recommendations: Continue Doxycycline 100 mg twice daily for 10 days and follow up with PCP to make sure infeciton resolves.  Pending work-up: none Labs: none, Imaging: none  B. Rheumatoid Arthritis: Recommendations: Make sure patient restarts her Plaquenil once infection has resolved Pending work-up: none Labs: none, Imaging: none  C. Polypharmacy: Recommendations: titrate down medications when possible.  Pending work-up: none Labs: none, Imaging: none   Important changes since admission: Plaquenil was held at discharge.   Follow-up Appointments:  Follow-up Information    McClester, Mallory, MD. Schedule an appointment as soon as possible for a visit in 1 week(s).   Specialty: Student Why: 1-2 Contact information: [address] [facility] [place] [address] [phone]               HOSPITAL COURSE:   Cellulitis: Patient presented with a 5 day history of right lower leg swelling around her distal femur and ankle with associated redness in the setting of restarting her Plaquenil roughly a week prior. She did not have any systemic symptoms of infection. She did, however, has some fluid collections on the lateral aspects of her Lt femur and on the medial/lateral aspects of her Lt ankle. MRI was performed without underlying osteomyelitis or abscess. Patients swelling and and redness improved on IV vancomycin and she was transition of oral doxycycline to complete a total of 14 days. Patient was instructed on the s/sx of worsening  infection and instructed to call her PCP for follow up.   Rheumatoid Arthritis: Patient's Plaquenil was held during this hospitalization due to infection. She was instructed to continue to hold this medication until her infection has resolved and she has follow up with her doctor.    Polypharmacy: Patient was counseled regarding her centrally acting medications. She was instructed to follow up with her PCP to titrate her medications.   DISCHARGE INSTRUCTIONS:   Discharge Instructions    Diet - low sodium heart healthy   Complete by: As directed    Discharge instructions   Complete by: As directed    Thank you for letting us take care of you.  As we discussed, please follow-up with your primary care doctor in the next week or 2 to reassess your legs.  Please noticed the medication changes that we discussed prior to discharge.   Medications: 1.  Please take doxycycline 100 mg  twice daily for 10 days 2.  Please hold hydroxychloroquine (Plaquenil) until you have followed up with your PCP/rheumatologist and your infection has resolved 3.  Otherwise please continue all of your home medications as prescribed by your PCP.  Please reach out to your primary care office if you have any questions regarding these medications. 4.  I have also prescribed you some low-dose hydrocortisone cream for your hands if your rash continues.  Please follow-up with your primary care doctor for reevaluation.  Do not hesitate to call our office at [phone] if you have any additional questions.   Increase activity slowly   Complete by: As directed    No wound care   Complete by: As directed       SUBJECTIVE:  Discharge Vitals:   BP 133/85 (BP Location: Right Arm)   Pulse 84   Temp 99.7 F (37.6 C) (Oral)   Resp 20   Ht $R'5\' 2"'CS$  (1.575 m)   Wt 68 kg   SpO2 94%   BMI 27.42 kg/m   OBJECTIVE:     Pertinent Labs, Studies, and Procedures:  CBC Latest Ref Rng & Units 01/19/2021 01/18/2021 01/17/2021  WBC  4.0 - 10.5 K/uL 7.7 8.5 7.3  Hemoglobin 12.0 - 15.0 g/dL 13.0 12.1 13.0  Hematocrit 36.0 - 46.0 % 38.1 36.4 38.9  Platelets 150 - 400 K/uL 407(H) 307 334    CMP Latest Ref Rng & Units 01/19/2021 01/18/2021 01/17/2021  Glucose 70 - 99 mg/dL 117(H) 93 -  BUN 6 - 20 mg/dL 16 18 -  Creatinine 0.44 - 1.00 mg/dL 0.67 0.63 0.75  Sodium 135 - 145 mmol/L 139 137 -  Potassium 3.5 - 5.1 mmol/L 3.9 4.1 -  Chloride 98 - 111 mmol/L 104 107 -  CO2 22 - 32 mmol/L 24 22 -  Calcium 8.9 - 10.3 mg/dL 9.3 9.1 -  Total Protein 6.5 - 8.1 g/dL - 6.0(L) -  Total Bilirubin 0.3 - 1.2 mg/dL - 0.3 -  Alkaline Phos 38 - 126 U/L - 40 -  AST 15 - 41 U/L - 21 -  ALT 0 - 44 U/L - 24 -    CT Angio Chest PE W and/or Wo Contrast  Result Date: 01/17/2021 CLINICAL DATA:  Right knee and leg swelling EXAM: CT ANGIOGRAPHY CHEST WITH CONTRAST TECHNIQUE: Multidetector CT imaging of the chest was performed using the standard protocol during bolus administration of intravenous contrast. Multiplanar CT image reconstructions and MIPs were obtained to evaluate the vascular anatomy. CONTRAST:  72mL OMNIPAQUE IOHEXOL 350 MG/ML SOLN COMPARISON:  None. FINDINGS: Cardiovascular: Poor bolus. There is filling defect in the main or right and left pulmonary arteries. Normal heart size. No pericardial effusion. Thoracic aorta is normal in caliber Mediastinum/Nodes: No enlarged lymph nodes. Visualized thyroid is unremarkable. Lungs/Pleura: Minimal patchy ground-glass density the left lower lobe. No pleural effusion or pneumothorax. Upper Abdomen: No acute abnormality. Musculoskeletal: Sigmoid scoliosis with partially imaged extensive fusion construct. No acute osseous abnormality. Review of the MIP images confirms the above findings. IMPRESSION: Poor contrast bolus timing.  No central pulmonary embolus. Minimal left lower lobe patchy ground-glass density reflects atelectasis, less likely infectious/inflammatory. Electronically Signed   By: Macy Mis  M.D.   On: 01/17/2021 08:03   VAS [place] LOWER EXTREMITY VENOUS (DVT) (ONLY [facility] & [facility])  Result Date: 01/19/2021  Lower Venous DVT Study Indications: Swelling.  Risk Factors: None identified. Comparison Study: No prior studies. Performing Technologist: Oliver Hum RVT  Examination Guidelines: A complete  evaluation includes B-mode imaging, spectral Doppler, color Doppler, and power Doppler as needed of all accessible portions of each vessel. Bilateral testing is considered an integral part of a complete examination. Limited examinations for reoccurring indications may be performed as noted. The reflux portion of the exam is performed with the patient in reverse Trendelenburg.  +---------+---------------+---------+-----------+----------+--------------+ RIGHT    CompressibilityPhasicitySpontaneityPropertiesThrombus Aging +---------+---------------+---------+-----------+----------+--------------+ CFV      Full           Yes      Yes                                 +---------+---------------+---------+-----------+----------+--------------+ SFJ      Full                                                        +---------+---------------+---------+-----------+----------+--------------+ FV Prox  Full                                                        +---------+---------------+---------+-----------+----------+--------------+ FV Mid   Full                                                        +---------+---------------+---------+-----------+----------+--------------+ FV DistalFull                                                        +---------+---------------+---------+-----------+----------+--------------+ PFV      Full                                                        +---------+---------------+---------+-----------+----------+--------------+ POP      Full           Yes      Yes                                  +---------+---------------+---------+-----------+----------+--------------+ PTV      Full                                                        +---------+---------------+---------+-----------+----------+--------------+ PERO     Full                                                        +---------+---------------+---------+-----------+----------+--------------+   +----+---------------+---------+-----------+----------+--------------+  LEFTCompressibilityPhasicitySpontaneityPropertiesThrombus Aging +----+---------------+---------+-----------+----------+--------------+ CFV Full           Yes      Yes                                 +----+---------------+---------+-----------+----------+--------------+     Summary: RIGHT: - There is no evidence of deep vein thrombosis in the lower extremity.  - No cystic structure found in the popliteal fossa.  LEFT: - No evidence of common femoral vein obstruction.  *See table(s) above for measurements and observations. Electronically signed by Ruta Hinds MD on 01/19/2021 at 11:18:30 AM.    Final      MR Ankle Right WO Contrast:  FINDINGS: Bones/Joint/Cartilage There is no marrow signal abnormality to suggest osteomyelitis, fracture, stress change or focal lesion. Ligaments Intact. Muscles and Tendons Intact. No evidence of septic or aseptic tenosynovitis. Imaged muscle bellies appear normal. Soft tissues Diffuse subcutaneous edema is present.  No focal fluid collection. IMPRESSION: Diffuse subcutaneous edema about the ankle consistent with cellulitis or dependent change. The exam is otherwise negative.  MR Femur Right WO Contrast:  FINDINGS: Bones/Joint/Cartilage Foot marrow signal is unremarkable without evidence of osteomyelitis, focal lesion, fracture or stress change. No hip or knee joint effusion is identified. Ligaments Negative Muscles and Tendons Intact and normal in appearance. No intramuscular fluid collection or  edema. Soft tissues Subcutaneous edema is seen about the lateral aspect of the mid and distal thighs bilaterally, greater on the right than the left. IMPRESSION: Subcutaneous edema in the lateral aspects of the mid and distal right thigh is compatible with cellulitis. There is similar subcutaneous edema in the left thigh although it is less extensive and intense. Negative for abscess, osteomyelitis, myositis or septic joint.  Signed:  Lawerance Cruel, D.O.  Internal Medicine Resident, PGY-2 [facility]  Pager: [phone] 7:32 AM, 01/20/2021

## 2021-01-19 NOTE — Progress Notes (Signed)
Patient discharging home. Vital signs stable at time of discharge as reflected in discharge summary. Discharge instructions given and verbal understanding returned. Patient discharging with 3 tramadol er pills. No questions at this time.

## 2021-01-19 NOTE — Progress Notes (Addendum)
HD#1 SUBJECTIVE:  Overnight Events: Had MRIs overnight   Patient states that she is doing well. She endorses improvement of her lower extremities. Her swelling and redness has gone down. I reviewed her MRI images with her.   OBJECTIVE:  Vital Signs: Vitals:   01/18/21 1633 01/18/21 1956 01/18/21 2341 01/19/21 0340  BP: 120/70 133/81 116/73 123/66  Pulse: 94 85 90 87  Resp: 16 18 18 19   Temp: 99.6 F (37.6 C) 99.6 F (37.6 C) 98.3 F (36.8 C) 98.4 F (36.9 C)  TempSrc: Oral Oral Oral Oral  SpO2: 100% 90% 92% 93%  Weight:      Height:       Supplemental O2: Room Air SpO2: 93 %  Filed Weights   01/16/21 2120  Weight: 68 kg     Intake/Output Summary (Last 24 hours) at 01/19/2021 0744 Last data filed at 01/19/2021 0659 Gross per 24 hour  Intake 1250.94 ml  Output --  Net 1250.94 ml   Net IO Since Admission: 1,650.94 mL [01/19/21 0744]  Physical Exam: Physical Exam Constitutional:      Appearance: Normal appearance.  HENT:     Head: Normocephalic and atraumatic.  Eyes:     Extraocular Movements: Extraocular movements intact.  Cardiovascular:     Rate and Rhythm: Normal rate.     Pulses: Normal pulses.     Heart sounds: Normal heart sounds.  Pulmonary:     Effort: Pulmonary effort is normal.     Breath sounds: Normal breath sounds.  Abdominal:     General: Bowel sounds are normal.     Palpations: Abdomen is soft.     Tenderness: There is no abdominal tenderness.  Musculoskeletal:        General: Normal range of motion.     Cervical back: Normal range of motion.     Right lower leg: No edema.     Left lower leg: No edema.  Skin:    General: Skin is warm and dry.     Comments: LLE redness and swelling has improved. (See picture in chart)  Neurological:     Mental Status: She is alert and oriented to person, place, and time. Mental status is at baseline.  Psychiatric:        Mood and Affect: Mood normal.     Media  Information          Patient Lines/Drains/Airways Status    Active Line/Drains/Airways    Name Placement date Placement time Site Days   Peripheral IV 01/17/21 Left Forearm 01/17/21  0633  Forearm  2   Wound / Incision (Open or Dehisced) 01/17/21 Non-pressure wound Leg Right;Left;Lower;Mid;Upper Scattered non-pressure wounds to legs bilat and buttocks 01/17/21  1950  Leg  2          No results for input(s): GLUCAP in the last 72 hours.   Pertinent Labs: CBC Latest Ref Rng & Units 01/19/2021 01/18/2021 01/17/2021  WBC 4.0 - 10.5 K/uL 7.7 8.5 7.3  Hemoglobin 12.0 - 15.0 g/dL 24.0 97.3 53.2  Hematocrit 36.0 - 46.0 % 38.1 36.4 38.9  Platelets 150 - 400 K/uL 407(H) 307 334    CMP Latest Ref Rng & Units 01/19/2021 01/18/2021 01/17/2021  Glucose 70 - 99 mg/dL 992(E) 93 -  BUN 6 - 20 mg/dL 16 18 -  Creatinine 2.68 - 1.00 mg/dL 3.41 9.62 2.29  Sodium 135 - 145 mmol/L 139 137 -  Potassium 3.5 - 5.1 mmol/L 3.9 4.1 -  Chloride 98 -  111 mmol/L 104 107 -  CO2 22 - 32 mmol/L 24 22 -  Calcium 8.9 - 10.3 mg/dL 9.3 9.1 -  Total Protein 6.5 - 8.1 g/dL - 6.0(L) -  Total Bilirubin 0.3 - 1.2 mg/dL - 0.3 -  Alkaline Phos 38 - 126 U/L - 40 -  AST 15 - 41 U/L - 21 -  ALT 0 - 44 U/L - 24 -    Imaging: MR [facility] RIGHT WO CONTRAST  Result Date: 01/18/2021 CLINICAL DATA:  The patient developed redness about the lateral aspect of the right thigh proximally 5 days ago. History of rheumatoid arthritis. EXAM: MRI OF THE RIGHT FEMUR WITHOUT CONTRAST TECHNIQUE: Multiplanar, multisequence MR imaging of the right femur was performed. No intravenous contrast was administered. COMPARISON:  Plain films right knee 03/13/2005. FINDINGS: Bones/Joint/Cartilage Foot marrow signal is unremarkable without evidence of osteomyelitis, focal lesion, fracture or stress change. No hip or knee joint effusion is identified. Ligaments Negative Muscles and Tendons Intact and normal in appearance. No intramuscular fluid collection  or edema. Soft tissues Subcutaneous edema is seen about the lateral aspect of the mid and distal thighs bilaterally, greater on the right than the left. IMPRESSION: Subcutaneous edema in the lateral aspects of the mid and distal right thigh is compatible with cellulitis. There is similar subcutaneous edema in the left thigh although it is less extensive and intense. Negative for abscess, osteomyelitis, myositis or septic joint. Electronically Signed   By: Drusilla Kanner M.D.   On: 01/18/2021 14:31   MR ANKLE RIGHT WO CONTRAST  Result Date: 01/18/2021 CLINICAL DATA:  Swelling of the right lower leg and foot which developed 5 days ago. History of rheumatoid arthritis. EXAM: MRI OF THE RIGHT ANKLE WITHOUT CONTRAST TECHNIQUE: Multiplanar, multisequence MR imaging of the right ankle was performed. No intravenous contrast was administered. COMPARISON:  None. FINDINGS: Bones/Joint/Cartilage There is no marrow signal abnormality to suggest osteomyelitis, fracture, stress change or focal lesion. Ligaments Intact. Muscles and Tendons Intact. No evidence of septic or aseptic tenosynovitis. Imaged muscle bellies appear normal. Soft tissues Diffuse subcutaneous edema is present.  No focal fluid collection. IMPRESSION: Diffuse subcutaneous edema about the ankle consistent with cellulitis or dependent change. The exam is otherwise negative. Electronically Signed   By: Drusilla Kanner M.D.   On: 01/18/2021 14:35    ASSESSMENT/PLAN:   Active Problems:   Cellulitis   #Non-purulentCellulitisof the RLE Continues to have improvement of her lower extremities on IV antibiotics.  MRIs of her right ankle and right femur showed evidence of cellulitis without any abscess or osteomyelitis.  Patient is on day 4 of IV antibiotics with vancomycin and tolerating it well.  Skin exam shows no other underlying areas concerning for abscess.  Patient also does not have any systemic signs of infection.  Therefore, she will likely  tolerate switching to p.o. antibiotics with doxycycline to finish a 14-day course.  She will need follow-up with her PCP to reevaluate her lower extremity cellulitis to make sure that it has resolved. -Continue IV vancomycin (4/14 days) switch to Doxy to complete a total of 14 days  - No additional imaging needed.  # Hypotension Patient is currently normotensive.  # Rheumatoid Arthritis -Continue to hold Plaquenil in the setting of her lower extremity infection.  # Chronic Pain  # Spinal Stenosis  # Hx of Spinal Cord Injury  -Continue home medications  # Anxiety # ADHD - Continue home seroquel and adderall  #Polypharmacy: Counseled the patient regarding her home  medication regimens.  I cautioned her regarding her polypharmacy and the likelihood that this could increase her risk of adverse medication reactions.  I encouraged her to speak with her primary care physician regarding adjusting/decreasing many of her centrally acting medications.  I reviewed the risks versus benefits of these medications, which included oversedation, intoxication, and life-threatening medication reactions.  I counseled her on my recommendations to decrease many of these medications, but will defer to her primary care physician regarding continuation in the outpatient setting.  Best Practice: Diet: Regular diet IVF: PO intake VTE: enoxaparin (LOVENOX) injection 40 mg Start: 01/17/21 1445 Code: Full PT/OT: Pending DISPO: home   Anticipated discharge today pending discharge.  Chari Manning, D.O.  Internal Medicine Resident, PGY-2 [facility]  Pager: [phone] 7:44 AM, 01/19/2021   Please contact the on call pager after 5 pm and on weekends at [phone].

## 2021-01-19 NOTE — Progress Notes (Signed)
Patient hands are red and itchy per patient. She states she is having an allergic reaction. Patient requesting steroids and hydrocortisone cream. MD is aware.

## 2021-01-22 LAB — CULTURE, BLOOD (ROUTINE X 2)
Culture: NO GROWTH
Culture: NO GROWTH

## 2021-01-22 LAB — HIV-1/2 AB - DIFFERENTIATION
HIV 1 Ab: NONREACTIVE
HIV 2 Ab: NONREACTIVE
Note: NEGATIVE

## 2021-01-23 LAB — MISC LABCORP TEST (SEND OUT): Labcorp test code: 83962

## 2021-10-31 ENCOUNTER — Other Ambulatory Visit: Payer: Self-pay

## 2021-10-31 ENCOUNTER — Encounter (HOSPITAL_COMMUNITY): Payer: Self-pay | Admitting: Emergency Medicine

## 2021-10-31 ENCOUNTER — Ambulatory Visit (HOSPITAL_COMMUNITY)
Admission: EM | Admit: 2021-10-31 | Discharge: 2021-10-31 | Disposition: A | Discharge status: Home / Self Care | Payer: Medicare Other | Attending: Urgent Care | Admitting: Urgent Care

## 2021-10-31 DIAGNOSIS — L723 Sebaceous cyst: Secondary | ICD-10-CM

## 2021-10-31 DIAGNOSIS — L089 Local infection of the skin and subcutaneous tissue, unspecified: Secondary | ICD-10-CM

## 2021-10-31 MED ORDER — MUPIROCIN CALCIUM 2 % EX CREA: 1.0000 "application " | TOPICAL_CREAM | Freq: Three times a day (TID) | CUTANEOUS | 0 refills | Status: AC

## 2021-10-31 MED ORDER — CEPHALEXIN 500 MG PO CAPS: 500.0000 mg | ORAL_CAPSULE | Freq: Four times a day (QID) | ORAL | 0 refills | Status: AC

## 2021-10-31 NOTE — ED Provider Notes (Signed)
[place]    CSN: [id number] Arrival date & time: 10/31/21  1935      History   Chief Complaint Chief Complaint  Patient presents with   Arm Pain    Left    HPI Ashlee [name] is a 35 y.o. female.   Pleasant 35 year old female presents today with concerns regarding a painful bump to her left armpit.  Patient states prior to Christmas she shaved her armpit before going to visit her fianc's family.  She states over the past couple days however she has noticed a bump in her armpit.  She denies a noted bump prior to the shaving.  She states it is red and slightly uncomfortable.  She denies any drainage or warmth to the area.  She states there is only 1 single lesion.  She denies any other systemic symptoms.  She has not tried any treatments.   Arm Pain   Past Medical History:  Diagnosis Date   Allergy    some nuts, juniper bushes   Anxiety    Back pain    GERD (gastroesophageal reflux disease)    Migraines    Mitral valve prolapse    Patent foramen ovale    Restless leg syndrome    Rheumatoid arthritis(714.0)    Sciatica    Scoliosis    Spinal cord disease [facility])     Patient Active Problem List   Diagnosis Date Noted   Cellulitis 01/17/2021   Rheumatoid arthritis ([place]) 12/01/2019   Spinal stenosis 12/01/2019   Chronic pain 12/01/2019   Acute encephalopathy 12/01/2019   Hyperkalemia 12/01/2019   Seizures ([place]) 12/01/2019   COVID-19 virus infection 12/01/2019    Past Surgical History:  Procedure Laterality Date   BACK SURGERY     CARDIAC CATHETERIZATION      OB History   No obstetric history on file.      Home Medications    Prior to Admission medications   Medication Sig Start Date End Date Taking? Authorizing Provider  cephALEXin (KEFLEX) 500 MG capsule Take 1 capsule (500 mg total) by mouth 4 (four) times daily for 7 days. 10/31/21 11/07/21 Yes Savyon Loken L, PA  mupirocin cream (BACTROBAN) 2 % Apply 1 application topically 3 (three)  times daily for 7 days. 10/31/21 11/07/21 Yes Tiegan Jambor L, PA  acetaminophen (TYLENOL) 500 MG tablet Take 500 mg by mouth every 6 (six) hours as needed for mild pain.    [provider]  amphetamine-dextroamphetamine (ADDERALL XR) 30 MG 24 hr capsule Take 30 mg by mouth every morning. Takes at 8 AM    [provider]  aspirin 81 MG chewable tablet Chew 81 mg by mouth daily.    [provider]  b complex vitamins capsule Take 1 capsule by mouth daily.    [provider]  baclofen (LIORESAL) 10 MG tablet Take 30-40 mg by mouth See admin instructions. 4 tablet in the morning at 8 AM 3 tablets in the afternoon between 1 and 2 PM 4 tablets at bedtime at 9 PM    [provider]  cetirizine (ZYRTEC) 10 MG tablet Take 10 mg by mouth 2 (two) times daily. Takes at 8 AM and 9 PM    [provider]  diphenhydrAMINE (BENADRYL) 25 mg capsule Take 25 mg by mouth daily as needed for allergies.    [provider]  ferrous sulfate 325 (65 FE) MG tablet Take 1 tablet by mouth daily.    [provider]  fexofenadine (ALLEGRA) 60 MG tablet Take 60 mg by mouth daily as needed for allergies.    [provider]  folic acid (FOLVITE) 1 MG tablet Take 1 mg by mouth at bedtime.  12/11/14   [provider]  gabapentin (NEURONTIN) 300 MG capsule Take 900 mg by mouth at bedtime. Patient takes 600mg  at 9 PM and 300 mg at 11 PM    [provider]  hydrocortisone cream 0.5 % Apply topically 2 (two) times daily. 01/19/21   Marianna Payment, MD  HYDROmorphone (DILAUDID) 2 MG tablet Take 2 mg by mouth 2 (two) times daily as needed for moderate pain. For pain    [provider]  Magnesium 250 MG TABS Take 250 mg by mouth daily.    [provider]  melatonin 3 MG TABS tablet Take 3 mg by mouth at bedtime.    [provider]  Menaquinone-7 (VITAMIN K2 PO) Take 1 tablet by mouth daily.    [provider]   Multiple Vitamins-Minerals (ALIVE WOMENS ENERGY PO) Take 1 tablet by mouth daily.    [provider]  naloxone [facility]) nasal spray 4 mg/0.1 mL Place 4 mg into the nose as needed. 01/01/20   [provider]  naproxen (NAPROSYN) 500 MG tablet Take 500 mg by mouth 2 (two) times daily.    [provider]  NIKKI 3-0.02 MG tablet Take 1 tablet by mouth daily. 01/08/21   [provider]  polyethylene glycol powder (GLYCOLAX/MIRALAX) 17 GM/SCOOP powder Take 17 g by mouth daily as needed for mild constipation. 11/08/17   [provider]  sertraline (ZOLOFT) 50 MG tablet Take 75 mg by mouth at bedtime. 11/15/20   [provider]  spironolactone (ALDACTONE) 50 MG tablet Take 50 mg by mouth daily. 02/15/19   [provider]  SUMAtriptan (IMITREX) 25 MG tablet Take 25 mg by mouth daily as needed for migraine. For migraine    [provider]  tiZANidine (ZANAFLEX) 4 MG capsule Take 4 mg by mouth 3 (three) times daily.    [provider]  traMADol (ULTRAM-ER) 100 MG 24 hr tablet Take 100 mg by mouth 3 (three) times daily.    [provider]  traZODone (DESYREL) 50 MG tablet Take 50 mg by mouth at bedtime. 01/13/21   [provider]  Vitamin D, Cholecalciferol, 25 MCG (1000 UT) TABS Take 1,000 Units by mouth daily.    [provider]    Family History Family History  Problem Relation Age of Onset   Healthy Mother    Heart attack Father     Social History Social History   Tobacco Use   Smoking status: Never   Smokeless tobacco: Never  Substance Use Topics   Alcohol use: No   Drug use: No     Allergies   Other, Tape, and Juniper tar   Review of Systems Review of Systems  Musculoskeletal:        Chronic pain, unchanged from baseline  Skin:  Positive for rash (erythema and swelling to L armpit).  All other systems reviewed and are negative.   Physical Exam Triage Vital Signs ED Triage  Vitals  Enc Vitals Group     BP 10/31/21 1947 116/63     Pulse Rate 10/31/21 1947 84     Resp 10/31/21 1947 16     Temp 10/31/21 1947 98.8 F (37.1 C)     Temp Source 10/31/21 1947 Oral     SpO2 10/31/21  1947 97 %     Weight --      Height --      Head Circumference --      Peak Flow --      Pain Score 10/31/21 1945 2     Pain Loc --      Pain Edu? --      Excl. in [place]? --    No data found.  Updated Vital Signs BP 116/63 (BP Location: Right Arm)    Pulse 84    Temp 98.8 F (37.1 C) (Oral)    Resp 16    LMP 09/23/2021    SpO2 97%   Visual Acuity Right Eye Distance:   Left Eye Distance:   Bilateral Distance:    Right Eye Near:   Left Eye Near:    Bilateral Near:     Physical Exam Vitals and nursing note reviewed.  Constitutional:      General: She is not in acute distress.    Appearance: Normal appearance. She is well-developed. She is not ill-appearing.  HENT:     Head: Normocephalic and atraumatic.  Eyes:     Conjunctiva/sclera: Conjunctivae normal.  Cardiovascular:     Rate and Rhythm: Normal rate.     Heart sounds: No murmur heard. Pulmonary:     Effort: Pulmonary effort is normal. No respiratory distress.  Musculoskeletal:        General: Deformity (pt in ankle braces, uses walker, scoliosis of spine) present. No swelling.     Cervical back: Neck supple.  Skin:    General: Skin is warm and dry.     Capillary Refill: Capillary refill takes less than 2 seconds.     Findings: Lesion (single mobile cyst to subQ of L armpit. No lymphadenopathy. Erythema directly over cyst) present.     Comments: No drainage or abscess formation. No lymphangitis  Neurological:     Mental Status: She is alert.  Psychiatric:        Mood and Affect: Mood normal.     UC Treatments / Results  Labs (all labs ordered are listed, but only abnormal results are displayed) Labs Reviewed - No data to display  EKG   Radiology No results found.  Procedures Procedures (including  critical care time)  Medications Ordered in UC Medications - No data to display  Initial Impression / Assessment and Plan / UC Course  I have reviewed the triage vital signs and the nursing notes.  Pertinent labs & imaging results that were available during my care of the patient were reviewed by me and considered in my medical decision making (see chart for details).     Infected sebaceous cyst -p.o. antibiotics, warm compresses, topical antibiotic ointment.  Follow-up with PCP or dermatologist should symptoms persist.  Monitor for any worsening erythema or streaking of the armpit which would warrant an immediate follow-up.  Final Clinical Impressions(s) / UC Diagnoses   Final diagnoses:  Infected sebaceous cyst of skin     Discharge Instructions      Use warm compresses to the left armpit. After soaking, blot dry and apply topical antibiotic ointment. Take keflex four times daily until gone. Follow up with PCP if no improvement, or if a small cyst remains after infection is resolved.    ED Prescriptions     Medication Sig Dispense Auth. Provider   cephALEXin (KEFLEX) 500 MG capsule Take 1 capsule (500 mg total) by mouth 4 (four) times daily for 7 days. 28 capsule [place],  Samanda Buske L, PA   mupirocin cream (BACTROBAN) 2 % Apply 1 application topically 3 (three) times daily for 7 days. 21 g Biviana Saddler L, [place]      PDMP not reviewed this encounter.   Maretta Bees, [place] 10/31/21 2030

## 2021-10-31 NOTE — ED Triage Notes (Signed)
Pt presents with "bump" under left arm xs 3 days.

## 2021-10-31 NOTE — Discharge Instructions (Signed)
Use warm compresses to the left armpit. After soaking, blot dry and apply topical antibiotic ointment. Take keflex four times daily until gone. Follow up with PCP if no improvement, or if a small cyst remains after infection is resolved.

## 2022-06-18 IMAGING — MR MR ANKLE*R* W/O CM
4 of 6 series · 19 of 40 positions shown · non-contrast
Comparison: None.

CLINICAL DATA: Swelling of the right lower leg and foot which
developed 5 days ago. History of rheumatoid arthritis.

EXAM:
MRI OF THE RIGHT ANKLE WITHOUT CONTRAST
TECHNIQUE: Multiplanar, multisequence MR imaging of the right ankle was
performed. No intravenous contrast was administered.

[Series 4: T1 · axial · 3.0mm · 0.27mm/px · z∈[-6,+74]mm · 3 of 36 slices shown (1 of 2)]
[im 6/36]
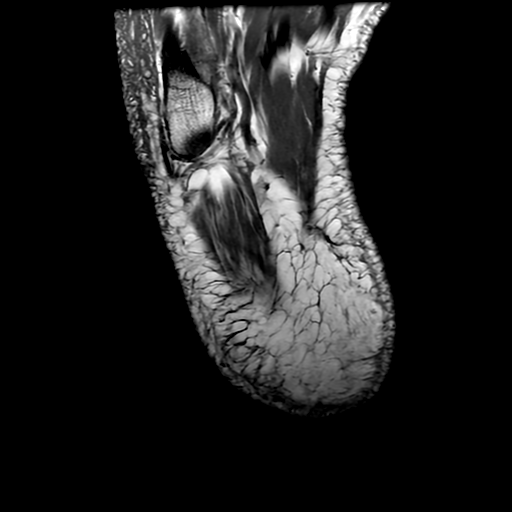
[im 18/36]
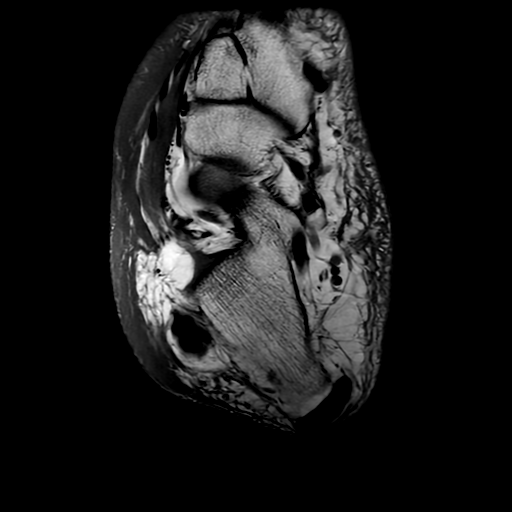
[im 30/36]
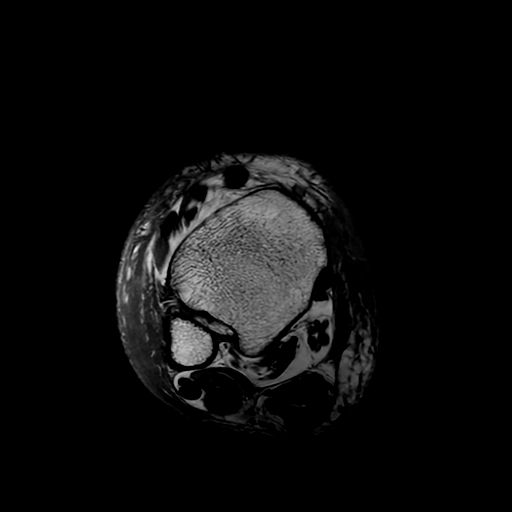

[Series 5: T2 fat-sat · oblique · 3.0mm · 0.31mm/px · 7 of 36 slices shown (1 of 2)]
[im 1/36]
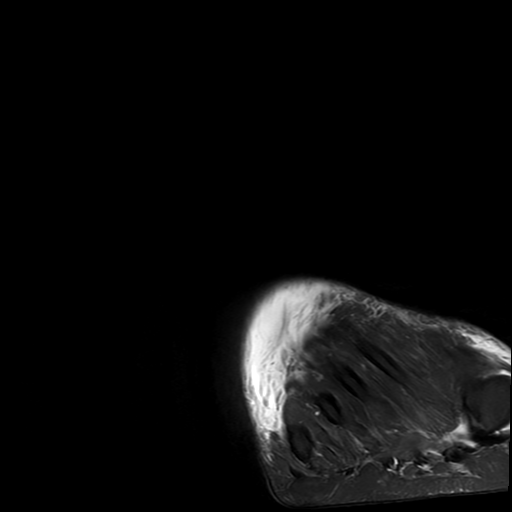
[im 6/36]
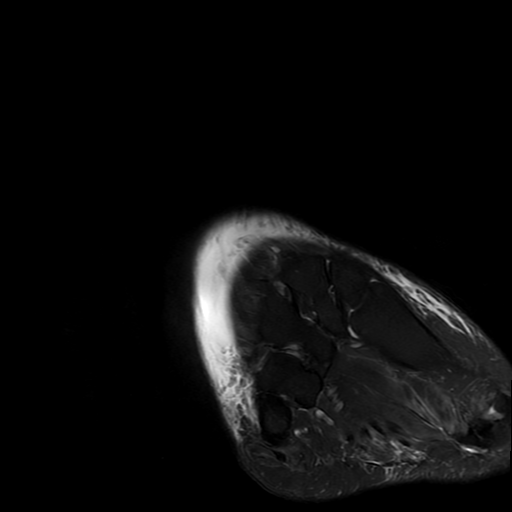
[im 12/36]
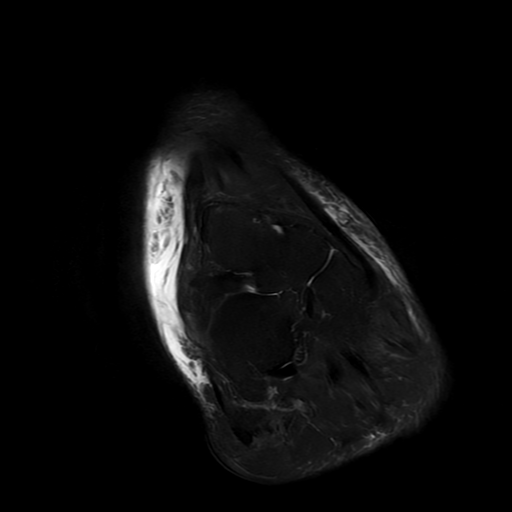
[im 18/36]
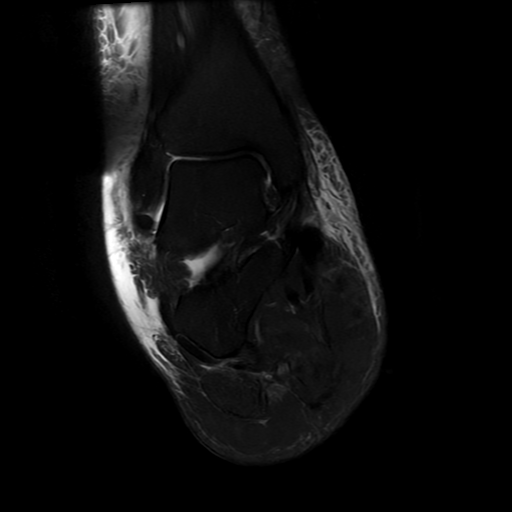
[im 24/36]
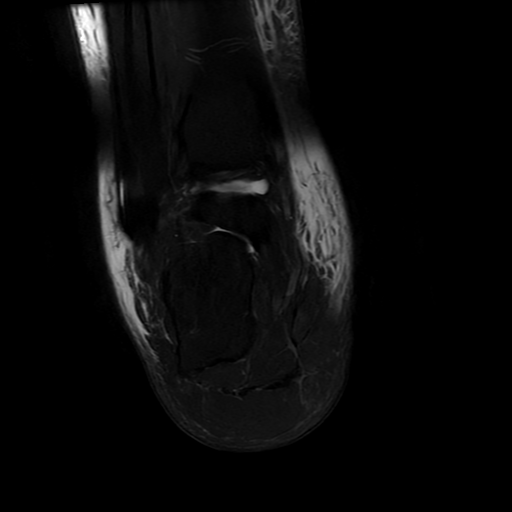
[im 30/36]
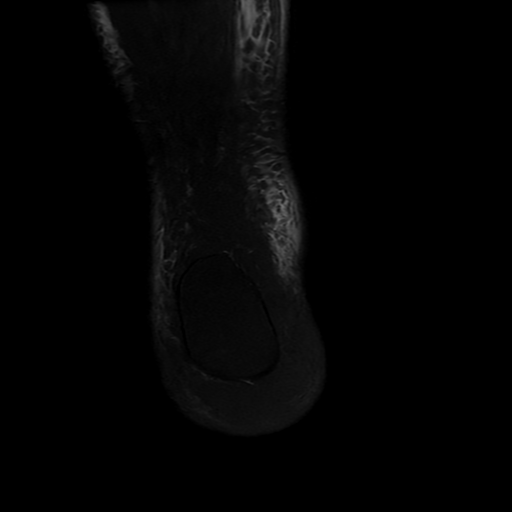
[im 36/36]
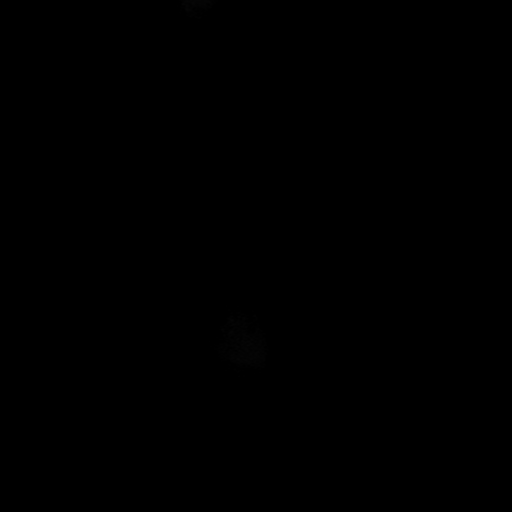

[Series 7: T1 · sagittal · 4.0mm · 0.31mm/px · 3 of 24 slices shown (2 of 2)]
[im 1/24]
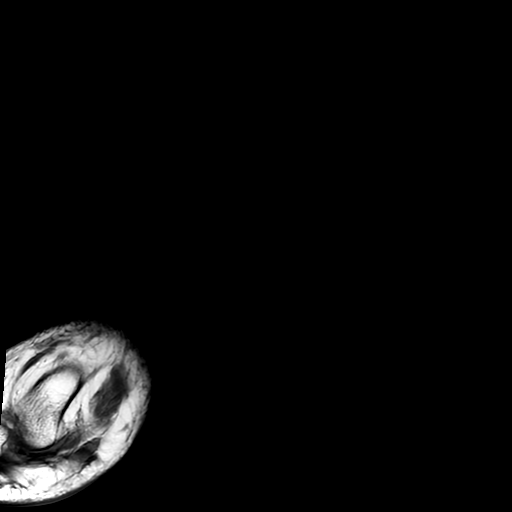
[im 12/24]
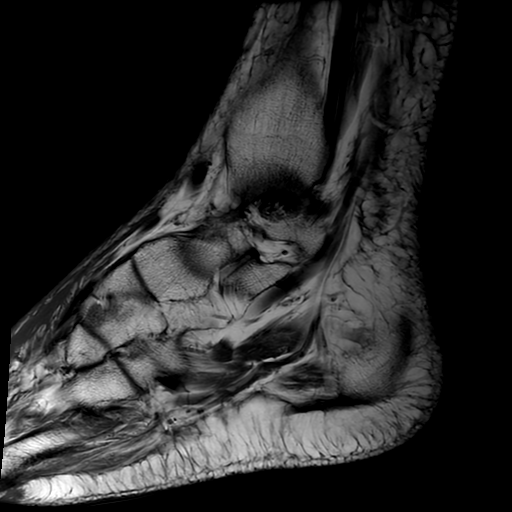
[im 24/24]
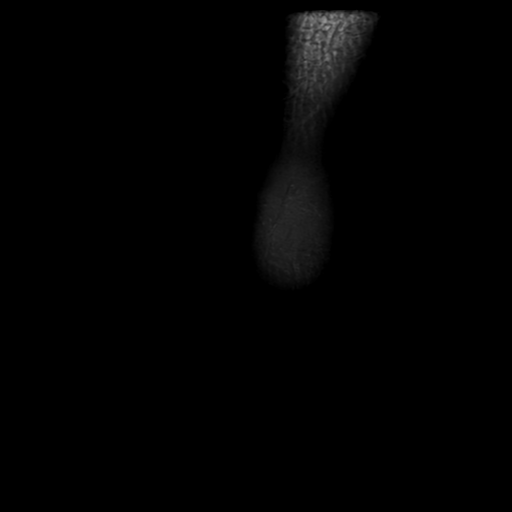

[Series 10: T2 fat-sat · axial · 3.0mm · 0.27mm/px · z∈[-23,+77]mm · 6 of 36 slices shown (2 of 2)]
[im 1/36]
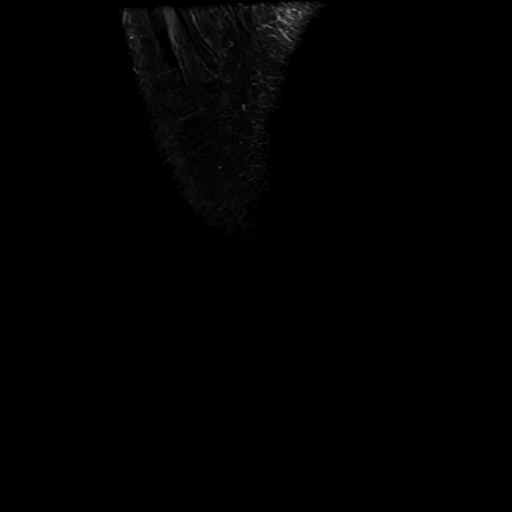
[im 6/36]
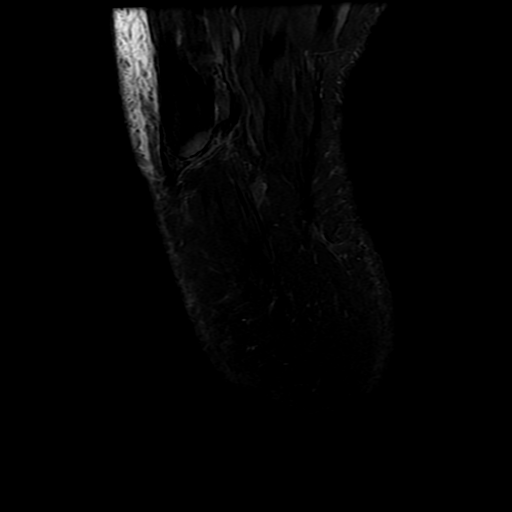
[im 11/36]
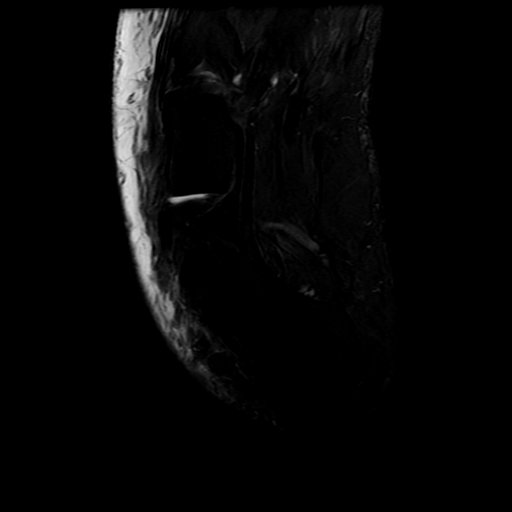
[im 16/36]
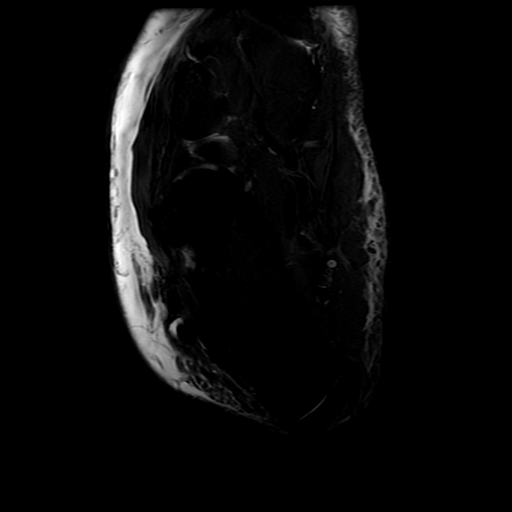
[im 21/36]
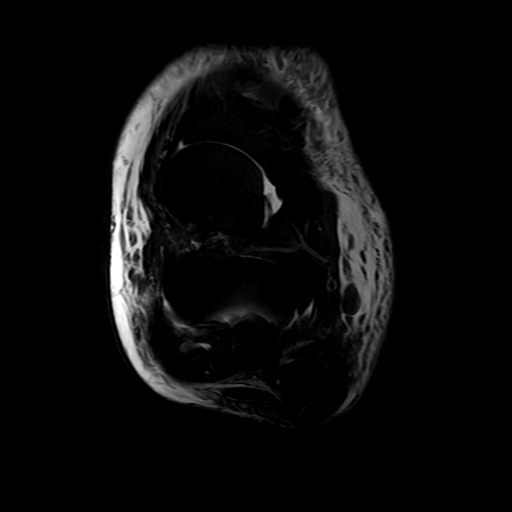
[im 31/36]
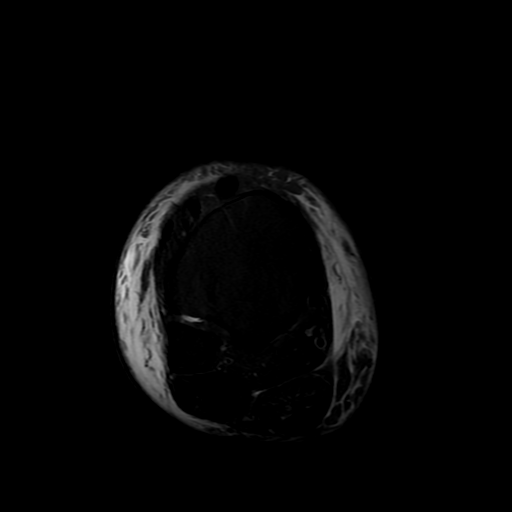

[19 of 40 positions shown; findings below may reference images not displayed]

FINDINGS: Bones/Joint/Cartilage There is no marrow signal abnormality to
suggest osteomyelitis, fracture, stress change or focal lesion.

Ligaments

Intact.

Muscles and Tendons

Intact. No evidence of septic or aseptic tenosynovitis. Imaged
muscle bellies appear normal.

Soft tissues

Diffuse subcutaneous edema is present.  No focal fluid collection.
IMPRESSION: Diffuse subcutaneous edema about the ankle consistent with
cellulitis or dependent change. The exam is otherwise negative.

## 2022-07-11 ENCOUNTER — Emergency Department (HOSPITAL_COMMUNITY)
Admission: EM | Admit: 2022-07-11 | Discharge: 2022-07-12 | Disposition: A | Discharge status: Home / Self Care | Payer: Medicare HMO | Attending: Emergency Medicine | Admitting: Emergency Medicine

## 2022-07-11 ENCOUNTER — Encounter (HOSPITAL_COMMUNITY): Payer: Self-pay | Admitting: Emergency Medicine

## 2022-07-11 ENCOUNTER — Other Ambulatory Visit: Payer: Self-pay

## 2022-07-11 ENCOUNTER — Ambulatory Visit (HOSPITAL_COMMUNITY)
Admission: EM | Admit: 2022-07-11 | Discharge: 2022-07-11 | Disposition: A | Discharge status: Home / Self Care | Payer: Medicare HMO

## 2022-07-11 DIAGNOSIS — L03116 Cellulitis of left lower limb: Secondary | ICD-10-CM

## 2022-07-11 DIAGNOSIS — L02416 Cutaneous abscess of left lower limb: Secondary | ICD-10-CM

## 2022-07-11 DIAGNOSIS — M71062 Abscess of bursa, left knee: Secondary | ICD-10-CM | POA: Diagnosis present

## 2022-07-11 DIAGNOSIS — W5501XA Bitten by cat, initial encounter: Secondary | ICD-10-CM | POA: Diagnosis not present

## 2022-07-11 DIAGNOSIS — D8989 Other specified disorders involving the immune mechanism, not elsewhere classified: Secondary | ICD-10-CM

## 2022-07-11 DIAGNOSIS — Z7982 Long term (current) use of aspirin: Secondary | ICD-10-CM | POA: Diagnosis not present

## 2022-07-11 NOTE — ED Triage Notes (Addendum)
Sunday, left knee started swelling and turning red.  Prior to this went [place] the beach and reports a small "healed nick to left knee".  Patient has a history of cellulitis.    While in intake room, has pointed to an injury on right hand/fingers.  Patient was bitten by a feral cat last night.  Animal control has cat.  No known record for this cat.  Fingers are swollen

## 2022-07-11 NOTE — ED Notes (Signed)
Patient is being discharged from the Urgent Care and sent to the Emergency Department via pov . Per Alysa A, PA, patient is in need of higher level of care due to rabies work up and evaluation of possible septic knee, comorbidity . Patient is aware and verbalizes understanding of plan of care.  Vitals:   07/11/22 1800  BP: 112/74  Pulse: 78  Resp: 20  Temp: 99.1 F (37.3 C)  SpO2: 97%

## 2022-07-11 NOTE — Discharge Instructions (Addendum)
Please present to the nearest emergency department for evaluation and treatment of your left knee and recent cat bites.

## 2022-07-11 NOTE — ED Provider Notes (Signed)
[facility]   MRN: [id number] DOB: 1985/11/19  Subjective:   Ashlee [name] is a 36 y.o. female presenting for left knee pain and possible infection.  Patient states that she was at [facility] last week and denies any injury to the knee, but did note that she had a small nick in the skin.  She developed some redness around the knee, but said it healed up and went away.  About 4 days ago she started to develop a pustule over the left knee, since then this has greatly enlarged with a lot of pain and now redness and swelling to her knee.  Tenderness with movement of the knee.  No fever or chills.  Patient also reports that last night a feral cat attacked her hands.  No current facility-administered medications for this encounter.  Current Outpatient Medications:    acetaminophen (TYLENOL) 500 MG tablet, Take 500 mg by mouth every 6 (six) hours as needed for mild pain., Disp: , Rfl:    amphetamine-dextroamphetamine (ADDERALL XR) 30 MG 24 hr capsule, Take 30 mg by mouth every morning. Takes at 8 AM (Patient not taking: Reported on 07/11/2022), Disp: , Rfl:    aspirin 81 MG chewable tablet, Chew 81 mg by mouth daily., Disp: , Rfl:    b complex vitamins capsule, Take 1 capsule by mouth daily., Disp: , Rfl:    baclofen (LIORESAL) 10 MG tablet, Take 30-40 mg by mouth See admin instructions. 4 tablet in the morning at 8 AM 3 tablets in the afternoon between 1 and 2 PM 4 tablets at bedtime at 9 PM, Disp: , Rfl:    cetirizine (ZYRTEC) 10 MG tablet, Take 10 mg by mouth 2 (two) times daily. Takes at 8 AM and 9 PM, Disp: , Rfl:    diphenhydrAMINE (BENADRYL) 25 mg capsule, Take 25 mg by mouth daily as needed for allergies., Disp: , Rfl:    ferrous sulfate 325 (65 FE) MG tablet, Take 1 tablet by mouth daily., Disp: , Rfl:    fexofenadine (ALLEGRA) 60 MG tablet, Take 60 mg by mouth daily as needed for allergies., Disp: , Rfl:    folic acid (FOLVITE) 1 MG tablet, Take 1 mg by mouth at bedtime. ,  Disp: , Rfl:    gabapentin (NEURONTIN) 300 MG capsule, Take 900 mg by mouth at bedtime. Patient takes 600mg  at 9 PM and 300 mg at 11 PM, Disp: , Rfl:    hydrocortisone cream 0.5 %, Apply topically 2 (two) times daily., Disp: 30 g, Rfl: 0   HYDROmorphone (DILAUDID) 2 MG tablet, Take 2 mg by mouth 2 (two) times daily as needed for moderate pain. For pain, Disp: , Rfl:    Magnesium 250 MG TABS, Take 250 mg by mouth daily., Disp: , Rfl:    melatonin 3 MG TABS tablet, Take 3 mg by mouth at bedtime., Disp: , Rfl:    Menaquinone-7 (VITAMIN K2 PO), Take 1 tablet by mouth daily., Disp: , Rfl:    Multiple Vitamins-Minerals (ALIVE WOMENS ENERGY PO), Take 1 tablet by mouth daily., Disp: , Rfl:    naloxone (NARCAN) nasal spray 4 mg/0.1 mL, Place 4 mg into the nose as needed., Disp: , Rfl:    naproxen (NAPROSYN) 500 MG tablet, Take 500 mg by mouth 2 (two) times daily., Disp: , Rfl:    NIKKI 3-0.02 MG tablet, Take 1 tablet by mouth daily., Disp: , Rfl:    polyethylene glycol powder (GLYCOLAX/MIRALAX) 17 GM/SCOOP powder,  Take 17 g by mouth daily as needed for mild constipation., Disp: , Rfl:    sertraline (ZOLOFT) 50 MG tablet, Take 75 mg by mouth at bedtime., Disp: , Rfl:    spironolactone (ALDACTONE) 50 MG tablet, Take 50 mg by mouth daily. (Patient not taking: Reported on 07/11/2022), Disp: , Rfl:    SUMAtriptan (IMITREX) 25 MG tablet, Take 25 mg by mouth daily as needed for migraine. For migraine, Disp: , Rfl:    tiZANidine (ZANAFLEX) 4 MG capsule, Take 4 mg by mouth 3 (three) times daily., Disp: , Rfl:    traMADol (ULTRAM-ER) 100 MG 24 hr tablet, Take 100 mg by mouth 3 (three) times daily., Disp: , Rfl:    traZODone (DESYREL) 50 MG tablet, Take 50 mg by mouth at bedtime., Disp: , Rfl:    Vitamin D, Cholecalciferol, 25 MCG (1000 UT) TABS, Take 1,000 Units by mouth daily., Disp: , Rfl:    Allergies  Allergen Reactions   Other Dermatitis, Itching, Other (See Comments), Rash and Swelling     Blisters and  redness,  Medipore, Durapore and Transpore tapes by 77M Use paper tape Other reaction(s): Other (See Comments), Other (See Comments) Blisters and redness  Blisters and redness,  Medipore, Durapore and Transpore tapes by 77M   Tape Rash, Dermatitis, Itching and Swelling    Use paper tape Other reaction(s): Other (See Comments), Other (See Comments) Blisters and redness  Blisters and redness,  Medipore, Durapore and Transpore tapes by [employer]     Other reaction(s): Other (See Comments) Contact dermatitis    Past Medical History:  Diagnosis Date   Allergy    some nuts, juniper bushes   Anxiety    Back pain    GERD (gastroesophageal reflux disease)    Migraines    Mitral valve prolapse    Patent foramen ovale    Restless leg syndrome    Rheumatoid arthritis(714.0)    Sciatica    Scoliosis    Spinal cord disease ([facility])      Past Surgical History:  Procedure Laterality Date   BACK SURGERY     CARDIAC CATHETERIZATION      Family History  Problem Relation Age of Onset   Healthy Mother    Heart attack Father     Social History   Tobacco Use   Smoking status: Never   Smokeless tobacco: Never  Vaping Use   Vaping Use: Never used  Substance Use Topics   Alcohol use: No   Drug use: No    ROS REFER TO HPI FOR PERTINENT POSITIVES AND NEGATIVES   Objective:   Vitals: BP 112/74 (BP Location: Left Arm)   Pulse 78   Temp 99.1 F (37.3 C) (Oral)   Resp 20   LMP 06/27/2022 (Approximate)   SpO2 97%   Physical Exam Constitutional:      Appearance: Normal appearance.  Cardiovascular:     Rate and Rhythm: Normal rate and regular rhythm.  Pulmonary:     Effort: Pulmonary effort is normal.     Breath sounds: Normal breath sounds.  Musculoskeletal:        General: Tenderness (left anterior knee) present.     Left lower leg: No edema.  Skin:    Findings: Erythema (see photos of knee pictured below) and lesion (Numerous puncture marks on hands consistent  with history of cat bite.) present.  Neurological:     Mental Status: She is alert.  No results found for this or any previous visit (from the past 24 hour(s)).  Assessment and Plan :   PDMP not reviewed this encounter.  1. Cellulitis of knee, left   2. Autoimmune disorder ([place])   3. Cat bite, initial encounter    36 year old female with autoimmune disorder including rheumatoid arthritis and previous hospitalization secondary to cellulitis.  Discussed that given this history and her immunocompromised status, she will need to present to the nearest emergency department for I&D and evaluation of this knee, she may need IV antibiotics.  She should also consider rabies treatment.  Patient agreeable and understanding of this plan.  She is stable for discharge by personal vehicle.    AllwardtRanda Evens, PA-C 07/11/22 Vernelle Emerald

## 2022-07-11 NOTE — ED Triage Notes (Signed)
Patient reports worsening skin abscess at left knee with drainage onset this week . NO fever or chills .

## 2022-07-12 DIAGNOSIS — M71062 Abscess of bursa, left knee: Secondary | ICD-10-CM | POA: Diagnosis not present

## 2022-07-12 MED ORDER — DOXYCYCLINE HYCLATE 100 MG PO CAPS: 100.0000 mg | ORAL_CAPSULE | Freq: Two times a day (BID) | ORAL | 0 refills | Status: AC

## 2022-07-12 MED ORDER — AMOXICILLIN-POT CLAVULANATE 875-125 MG PO TABS: 1.0000 | ORAL_TABLET | Freq: Two times a day (BID) | ORAL | 0 refills | Status: AC

## 2022-07-12 MED ORDER — TETANUS-DIPHTH-ACELL PERTUSSIS 5-2.5-18.5 LF-MCG/0.5 IM SUSY
0.5000 mL | PREFILLED_SYRINGE | Freq: Once | INTRAMUSCULAR | Status: DC
  Filled 2022-07-12: qty 0.5

## 2022-07-12 MED ORDER — LIDOCAINE-EPINEPHRINE (PF) 2 %-1:200000 IJ SOLN
10.0000 mL | Freq: Once | INTRAMUSCULAR | Status: AC
  Administered 2022-07-12: 10 mL
  Filled 2022-07-12: qty 20

## 2022-07-12 NOTE — ED Provider Notes (Signed)
[facility] EMERGENCY DEPARTMENT Provider Note   CSN: [id number] Arrival date & time: 07/11/22  1943     History Past medical history of rheumatoid arthritis on Plaquenil therapy Chief Complaint  Patient presents with   Abscess    Ashlee [name] is a 36 y.o. female.  Patient presents with abscess to her right knee as well as cat bite on her left hand.  She says she thinks she may have fallen about a week or 2 ago and had a small abrasion on her right knee.  She has that healed ever fine, but over the past week she has noticed worsening redness and swelling overlying her kneecap.  She also states that on Friday she sustained Bites to her hands by a feral cat.  The cat is an animal control custody and has not showed any signs of rabies.  Patient states that the wounds do not appear infected, there has been no abnormal drainage.  She has had no fevers or chills.   She was originally seen yesterday day by Urgent Care and was sent here due to concern of her RA making her immunocompromised.   HPI     Home Medications Prior to Admission medications   Medication Sig Start Date End Date Taking? Authorizing Provider  amoxicillin-clavulanate (AUGMENTIN) 875-125 MG tablet Take 1 tablet by mouth every 12 (twelve) hours for 7 days. 07/12/22 07/19/22 Yes Morrisa Aldaba, Finis Bud, PA-C  doxycycline (VIBRAMYCIN) 100 MG capsule Take 1 capsule (100 mg total) by mouth 2 (two) times daily for 7 days. 07/12/22 07/19/22 Yes Danesha Kirchoff, Finis Bud, PA-C  acetaminophen (TYLENOL) 500 MG tablet Take 500 mg by mouth every 6 (six) hours as needed for mild pain.    [provider]  amphetamine-dextroamphetamine (ADDERALL XR) 30 MG 24 hr capsule Take 30 mg by mouth every morning. Takes at 8 AM Patient not taking: Reported on 07/11/2022    [provider]  aspirin 81 MG chewable tablet Chew 81 mg by mouth daily.    [provider]  b complex vitamins capsule Take 1 capsule by mouth daily.     [provider]  baclofen (LIORESAL) 10 MG tablet Take 30-40 mg by mouth See admin instructions. 4 tablet in the morning at 8 AM 3 tablets in the afternoon between 1 and 2 PM 4 tablets at bedtime at 9 PM    [provider]  cetirizine (ZYRTEC) 10 MG tablet Take 10 mg by mouth 2 (two) times daily. Takes at 8 AM and 9 PM    [provider]  diphenhydrAMINE (BENADRYL) 25 mg capsule Take 25 mg by mouth daily as needed for allergies.    [provider]  ferrous sulfate 325 (65 FE) MG tablet Take 1 tablet by mouth daily.    [provider]  fexofenadine (ALLEGRA) 60 MG tablet Take 60 mg by mouth daily as needed for allergies.    [provider]  folic acid (FOLVITE) 1 MG tablet Take 1 mg by mouth at bedtime.  12/11/14   [provider]  gabapentin (NEURONTIN) 300 MG capsule Take 900 mg by mouth at bedtime. Patient takes 600mg  at 9 PM and 300 mg at 11 PM    [provider]  hydrocortisone cream 0.5 % Apply topically 2 (two) times daily. 01/19/21   01/21/21, MD  HYDROmorphone (DILAUDID) 2 MG tablet Take 2 mg by mouth 2 (two) times daily as needed for moderate pain. For pain  [provider]  Magnesium 250 MG TABS Take 250 mg by mouth daily.    [provider]  melatonin 3 MG TABS tablet Take 3 mg by mouth at bedtime.    [provider]  Menaquinone-7 (VITAMIN K2 PO) Take 1 tablet by mouth daily.    [provider]  Multiple Vitamins-Minerals (ALIVE WOMENS ENERGY PO) Take 1 tablet by mouth daily.    [provider]  naloxone [facility]) nasal spray 4 mg/0.1 mL Place 4 mg into the nose as needed. 01/01/20   [provider]  naproxen (NAPROSYN) 500 MG tablet Take 500 mg by mouth 2 (two) times daily.    [provider]  NIKKI 3-0.02 MG tablet Take 1 tablet by mouth daily. 01/08/21   [provider]  polyethylene glycol powder (GLYCOLAX/MIRALAX) 17 GM/SCOOP powder Take  17 g by mouth daily as needed for mild constipation. 11/08/17   [provider]  sertraline (ZOLOFT) 50 MG tablet Take 75 mg by mouth at bedtime. 11/15/20   [provider]  spironolactone (ALDACTONE) 50 MG tablet Take 50 mg by mouth daily. Patient not taking: Reported on 07/11/2022 02/15/19   [provider]  SUMAtriptan (IMITREX) 25 MG tablet Take 25 mg by mouth daily as needed for migraine. For migraine    [provider]  tiZANidine (ZANAFLEX) 4 MG capsule Take 4 mg by mouth 3 (three) times daily.    [provider]  traMADol (ULTRAM-ER) 100 MG 24 hr tablet Take 100 mg by mouth 3 (three) times daily.    [provider]  traZODone (DESYREL) 50 MG tablet Take 50 mg by mouth at bedtime. 01/13/21   [provider]  Vitamin D, Cholecalciferol, 25 MCG (1000 UT) TABS Take 1,000 Units by mouth daily.    [provider]      Allergies    Other, Tape, and Juniper tar    Review of Systems   Review of Systems  Skin:  Positive for color change and wound.  All other systems reviewed and are negative.   Physical Exam Updated Vital Signs BP (!) 102/41 (BP Location: Right Arm)   Pulse 68   Temp 98.6 F (37 C) (Oral)   Resp 15   LMP 06/27/2022 (Approximate)   SpO2 98%  Physical Exam Vitals and nursing note reviewed.  Constitutional:      General: She is not in acute distress.    Appearance: Normal appearance. She is well-developed. She is not ill-appearing, toxic-appearing or diaphoretic.  HENT:     Head: Normocephalic and atraumatic.     Nose: No nasal deformity.     Mouth/Throat:     Lips: Pink. No lesions.  Eyes:     General: Gaze aligned appropriately. No scleral icterus.       Right eye: No discharge.        Left eye: No discharge.     Conjunctiva/sclera: Conjunctivae normal.     Right eye: Right conjunctiva is not injected. No exudate or hemorrhage.    Left eye: Left conjunctiva is not injected. No exudate or  hemorrhage. Pulmonary:     Effort: Pulmonary effort is normal. No respiratory distress.  Skin:    General: Skin is warm and dry.     Comments: ~ 2.5 cm round erythematous, fluctuant area on top of right patella. No drainage noted. Surrounding erythema noted. Abscess does not seem to penetrate the joint.   Small cat bites/scratches of the left thumb and on the  hand. No abnormal drainage, swelling, or erythema  Neurological:     Mental Status: She is alert and oriented to person, place, and time.  Psychiatric:        Mood and Affect: Mood normal.        Speech: Speech normal.        Behavior: Behavior normal. Behavior is cooperative.     ED Results / Procedures / Treatments   Labs (all labs ordered are listed, but only abnormal results are displayed) Labs Reviewed - No data to display  EKG None  Radiology No results found.  Procedures .Marland KitchenIncision and Drainage  Date/Time: 07/12/2022 11:41 AM  Performed by: Claudie Leach, PA-C Authorized by: Claudie Leach, PA-C   Consent:    Consent obtained:  Verbal   Consent given by:  Patient   Risks, benefits, and alternatives were discussed: yes     Risks discussed:  Bleeding, incomplete drainage and pain   Alternatives discussed:  No treatment, delayed treatment, alternative treatment and observation Universal protocol:    Procedure explained and questions answered to patient or proxy's satisfaction: yes     Patient identity confirmed:  Verbally with patient Location:    Type:  Abscess   Size:  3 cm   Location:  Lower extremity   Lower extremity location:  Knee   Knee location:  L knee Pre-procedure details:    Skin preparation:  Povidone-iodine and chlorhexidine Sedation:    Sedation type:  None Anesthesia:    Anesthesia method:  Local infiltration   Local anesthetic:  Lidocaine 2% WITH epi Procedure type:    Complexity:  Simple Procedure details:    Ultrasound guidance: no     Needle aspiration: no     Incision  types:  Single straight   Incision depth:  Dermal   Wound management:  Probed and deloculated and irrigated with saline   Drainage:  Purulent   Drainage amount:  Copious   Wound treatment:  Wound left open   Packing materials:  None Post-procedure details:    Procedure completion:  Tolerated    Medications Ordered in ED Medications  lidocaine-EPINEPHrine (XYLOCAINE W/EPI) 2 %-1:200000 (PF) injection 10 mL (has no administration in time range)  Tdap (BOOSTRIX) injection 0.5 mL (0.5 mLs Intramuscular Not Given 07/12/22 1059)    ED Course/ Medical Decision Making/ A&P                           Medical Decision Making Risk Prescription drug management.   Patient is here after being sent from urgent care for an abscess on her left knee.  She is afebrile and is stable vital signs.  She has had no systemic symptoms at home.  I do not feel that she needs any laboratory work or any further imaging.  The abscess itself does not look to penetrate the knee joint.  I have cleaned the area and performed incision and drainage and got copious purulent discharge.  She has some surrounding cellulitis, so I have put her on doxycycline to cover for MRSA.  She also subsequently had some cat bites to her left hand yesterday.  These do not look infected on exam, but given the area of the bites and her rheumatoid arthritis on Plaquenil, we will go ahead and treat with Augmentin prophylactic.   She is given strict return precautions. She should follow up with PCP.    Final Clinical Impression(s) / ED Diagnoses Final diagnoses:  Cat bite, initial encounter  Abscess of left knee    Rx / DC Orders ED Discharge Orders          Ordered    amoxicillin-clavulanate (AUGMENTIN) 875-125 MG tablet  Every 12 hours        07/12/22 1138    doxycycline (VIBRAMYCIN) 100 MG capsule  2 times daily        07/12/22 1138              Harding Thomure, Adora Fridge, PA-C 07/12/22 1144    [place], [place],  DO 07/12/22 1525

## 2022-07-12 NOTE — Discharge Instructions (Signed)
Your abscess has been drained in the ED. I have placed you on Doxycycline to cover for MRSA infection.  For your Cat bite, I have placed you on Augmentin to prevent infection.   Please return to the ED if you develop fevers, worsening skin changes around the abscess site or the cat bite areas.   Please schedule an ED follow up with your PCP
